# Patient Record
Sex: Male | Born: 1960 | Race: White | Hispanic: No | Smoking: Former smoker
Health system: Southern US, Community
[De-identification: ages and names within clinical notes are randomized; demographics above are authoritative.]

## PROBLEM LIST (undated history)

## (undated) DIAGNOSIS — J449 Chronic obstructive pulmonary disease, unspecified: Secondary | ICD-10-CM

## (undated) DIAGNOSIS — I255 Ischemic cardiomyopathy: Secondary | ICD-10-CM

## (undated) DIAGNOSIS — I499 Cardiac arrhythmia, unspecified: Secondary | ICD-10-CM

## (undated) DIAGNOSIS — N189 Chronic kidney disease, unspecified: Secondary | ICD-10-CM

## (undated) DIAGNOSIS — G8929 Other chronic pain: Secondary | ICD-10-CM

## (undated) DIAGNOSIS — I10 Essential (primary) hypertension: Secondary | ICD-10-CM

## (undated) DIAGNOSIS — Z95810 Presence of automatic (implantable) cardiac defibrillator: Secondary | ICD-10-CM

## (undated) DIAGNOSIS — K219 Gastro-esophageal reflux disease without esophagitis: Secondary | ICD-10-CM

## (undated) DIAGNOSIS — K7460 Unspecified cirrhosis of liver: Secondary | ICD-10-CM

## (undated) DIAGNOSIS — F32A Depression, unspecified: Secondary | ICD-10-CM

## (undated) DIAGNOSIS — B1920 Unspecified viral hepatitis C without hepatic coma: Secondary | ICD-10-CM

## (undated) DIAGNOSIS — F329 Major depressive disorder, single episode, unspecified: Secondary | ICD-10-CM

## (undated) DIAGNOSIS — M48061 Spinal stenosis, lumbar region without neurogenic claudication: Secondary | ICD-10-CM

## (undated) DIAGNOSIS — B1910 Unspecified viral hepatitis B without hepatic coma: Secondary | ICD-10-CM

## (undated) DIAGNOSIS — E785 Hyperlipidemia, unspecified: Secondary | ICD-10-CM

## (undated) DIAGNOSIS — M1990 Unspecified osteoarthritis, unspecified site: Secondary | ICD-10-CM

## (undated) DIAGNOSIS — M4800 Spinal stenosis, site unspecified: Secondary | ICD-10-CM

## (undated) DIAGNOSIS — I2510 Atherosclerotic heart disease of native coronary artery without angina pectoris: Secondary | ICD-10-CM

## (undated) DIAGNOSIS — I509 Heart failure, unspecified: Secondary | ICD-10-CM

## (undated) DIAGNOSIS — I609 Nontraumatic subarachnoid hemorrhage, unspecified: Secondary | ICD-10-CM

## (undated) HISTORY — DX: Nontraumatic subarachnoid hemorrhage, unspecified: I60.9

## (undated) HISTORY — DX: Other chronic pain: G89.29

## (undated) HISTORY — DX: Heart failure, unspecified: I50.9

## (undated) HISTORY — DX: Unspecified viral hepatitis C without hepatic coma: B19.20

## (undated) HISTORY — DX: Spinal stenosis, site unspecified: M48.00

## (undated) HISTORY — DX: Hyperlipidemia, unspecified: E78.5

## (undated) HISTORY — PX: OTHER SURGICAL HISTORY: SHX169

## (undated) HISTORY — DX: Depression, unspecified: F32.A

---
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
## 2003-12-22 ENCOUNTER — Emergency Department (HOSPITAL_COMMUNITY): Admission: EM | Admit: 2003-12-22 | Discharge: 2003-12-23 | Payer: Self-pay

## 2019-02-06 DIAGNOSIS — I219 Acute myocardial infarction, unspecified: Secondary | ICD-10-CM

## 2019-02-06 DIAGNOSIS — I472 Ventricular tachycardia, unspecified: Secondary | ICD-10-CM

## 2019-02-06 DIAGNOSIS — I214 Non-ST elevation (NSTEMI) myocardial infarction: Secondary | ICD-10-CM

## 2019-02-06 HISTORY — DX: Ventricular tachycardia: I47.2

## 2019-02-06 HISTORY — DX: Ventricular tachycardia, unspecified: I47.20

## 2019-02-06 HISTORY — DX: Acute myocardial infarction, unspecified: I21.9

## 2019-02-06 HISTORY — DX: Non-ST elevation (NSTEMI) myocardial infarction: I21.4

## 2019-02-06 HISTORY — PX: ICD IMPLANT: EP1208

## 2019-02-28 ENCOUNTER — Inpatient Hospital Stay
Admission: EM | Admit: 2019-02-28 | Discharge: 2019-03-02 | DRG: 280 | Discharge status: Against Medical Advice | Payer: Medicaid Other | Attending: Internal Medicine | Admitting: Internal Medicine

## 2019-02-28 ENCOUNTER — Emergency Department: Payer: Medicaid Other

## 2019-02-28 ENCOUNTER — Inpatient Hospital Stay: Payer: Medicaid Other

## 2019-02-28 ENCOUNTER — Other Ambulatory Visit: Payer: Self-pay

## 2019-02-28 DIAGNOSIS — K7469 Other cirrhosis of liver: Secondary | ICD-10-CM

## 2019-02-28 DIAGNOSIS — E785 Hyperlipidemia, unspecified: Secondary | ICD-10-CM

## 2019-02-28 DIAGNOSIS — B1910 Unspecified viral hepatitis B without hepatic coma: Secondary | ICD-10-CM | POA: Diagnosis present

## 2019-02-28 DIAGNOSIS — J441 Chronic obstructive pulmonary disease with (acute) exacerbation: Secondary | ICD-10-CM

## 2019-02-28 DIAGNOSIS — I214 Non-ST elevation (NSTEMI) myocardial infarction: Secondary | ICD-10-CM | POA: Diagnosis present

## 2019-02-28 DIAGNOSIS — F17200 Nicotine dependence, unspecified, uncomplicated: Secondary | ICD-10-CM | POA: Diagnosis present

## 2019-02-28 DIAGNOSIS — Z20822 Contact with and (suspected) exposure to covid-19: Secondary | ICD-10-CM | POA: Diagnosis present

## 2019-02-28 DIAGNOSIS — Z23 Encounter for immunization: Secondary | ICD-10-CM | POA: Diagnosis not present

## 2019-02-28 DIAGNOSIS — B182 Chronic viral hepatitis C: Secondary | ICD-10-CM

## 2019-02-28 DIAGNOSIS — K760 Fatty (change of) liver, not elsewhere classified: Secondary | ICD-10-CM | POA: Diagnosis present

## 2019-02-28 DIAGNOSIS — I429 Cardiomyopathy, unspecified: Secondary | ICD-10-CM | POA: Diagnosis present

## 2019-02-28 DIAGNOSIS — I110 Hypertensive heart disease with heart failure: Secondary | ICD-10-CM | POA: Diagnosis present

## 2019-02-28 DIAGNOSIS — I472 Ventricular tachycardia, unspecified: Secondary | ICD-10-CM

## 2019-02-28 DIAGNOSIS — R0602 Shortness of breath: Secondary | ICD-10-CM

## 2019-02-28 DIAGNOSIS — I5021 Acute systolic (congestive) heart failure: Secondary | ICD-10-CM

## 2019-02-28 DIAGNOSIS — R7401 Elevation of levels of liver transaminase levels: Secondary | ICD-10-CM

## 2019-02-28 DIAGNOSIS — J9601 Acute respiratory failure with hypoxia: Secondary | ICD-10-CM | POA: Diagnosis present

## 2019-02-28 DIAGNOSIS — I509 Heart failure, unspecified: Secondary | ICD-10-CM

## 2019-02-28 DIAGNOSIS — Z5329 Procedure and treatment not carried out because of patient's decision for other reasons: Secondary | ICD-10-CM | POA: Diagnosis not present

## 2019-02-28 DIAGNOSIS — Z716 Tobacco abuse counseling: Secondary | ICD-10-CM

## 2019-02-28 DIAGNOSIS — Z79899 Other long term (current) drug therapy: Secondary | ICD-10-CM

## 2019-02-28 HISTORY — DX: Essential (primary) hypertension: I10

## 2019-02-28 HISTORY — DX: Chronic obstructive pulmonary disease, unspecified: J44.9

## 2019-02-28 LAB — HEPARIN LEVEL (UNFRACTIONATED): Heparin Unfractionated: 0.38 IU/mL (ref 0.30–0.70)

## 2019-02-28 LAB — HEPATIC FUNCTION PANEL
ALT: 172 U/L — ABNORMAL HIGH (ref 0–44)
AST: 96 U/L — ABNORMAL HIGH (ref 15–41)
Albumin: 3.8 g/dL (ref 3.5–5.0)
Alkaline Phosphatase: 76 U/L (ref 38–126)
Bilirubin, Direct: 0.5 mg/dL — ABNORMAL HIGH (ref 0.0–0.2)
Indirect Bilirubin: 1.1 mg/dL — ABNORMAL HIGH (ref 0.3–0.9)
Total Bilirubin: 1.6 mg/dL — ABNORMAL HIGH (ref 0.3–1.2)
Total Protein: 7 g/dL (ref 6.5–8.1)

## 2019-02-28 LAB — HEMOGLOBIN A1C
Hgb A1c MFr Bld: 5.9 % — ABNORMAL HIGH (ref 4.8–5.6)
Mean Plasma Glucose: 122.63 mg/dL

## 2019-02-28 LAB — BASIC METABOLIC PANEL
Anion gap: 14 (ref 5–15)
BUN: 36 mg/dL — ABNORMAL HIGH (ref 6–20)
CO2: 24 mmol/L (ref 22–32)
Calcium: 8.4 mg/dL — ABNORMAL LOW (ref 8.9–10.3)
Chloride: 97 mmol/L — ABNORMAL LOW (ref 98–111)
Creatinine, Ser: 0.97 mg/dL (ref 0.61–1.24)
GFR calc Af Amer: >60 mL/min (ref >60)
GFR calc non Af Amer: >60 mL/min (ref >60)
Glucose, Bld: 176 mg/dL — ABNORMAL HIGH (ref 70–99)
Potassium: 3.6 mmol/L (ref 3.5–5.1)
Sodium: 135 mmol/L (ref 135–145)

## 2019-02-28 LAB — BLOOD GAS, ARTERIAL
Acid-base deficit: 0.8 mmol/L (ref 0.0–2.0)
Bicarbonate: 25.4 mmol/L (ref 20.0–28.0)
FIO2: 100
O2 Saturation: 52.7 %
Patient temperature: 37
pCO2 arterial: 47 mmHg (ref 32.0–48.0)
pH, Arterial: 7.34 — ABNORMAL LOW (ref 7.350–7.450)
pO2, Arterial: <31 mmHg — CL (ref 83.0–108.0)

## 2019-02-28 LAB — RESPIRATORY PANEL BY RT PCR (FLU A&B, COVID)
Influenza A by PCR: NEGATIVE
Influenza B by PCR: NEGATIVE
SARS Coronavirus 2 by RT PCR: NEGATIVE

## 2019-02-28 LAB — CBC
HCT: 42 % (ref 39.0–52.0)
Hemoglobin: 13.9 g/dL (ref 13.0–17.0)
MCH: 28.4 pg (ref 26.0–34.0)
MCHC: 33.1 g/dL (ref 30.0–36.0)
MCV: 85.9 fL (ref 80.0–100.0)
Platelets: 260 10*3/uL (ref 150–400)
RBC: 4.89 MIL/uL (ref 4.22–5.81)
RDW: 13.8 % (ref 11.5–15.5)
WBC: 14.4 10*3/uL — ABNORMAL HIGH (ref 4.0–10.5)
nRBC: 0 % (ref 0.0–0.2)

## 2019-02-28 LAB — GLUCOSE, CAPILLARY: Glucose-Capillary: 154 mg/dL — ABNORMAL HIGH (ref 70–99)

## 2019-02-28 LAB — TROPONIN I (HIGH SENSITIVITY)
Troponin I (High Sensitivity): 63 ng/L — ABNORMAL HIGH (ref <18)
Troponin I (High Sensitivity): 62 ng/L — ABNORMAL HIGH (ref <18)

## 2019-02-28 LAB — PROTIME-INR
INR: 1.2 (ref 0.8–1.2)
Prothrombin Time: 15.1 seconds (ref 11.4–15.2)

## 2019-02-28 LAB — APTT: aPTT: 29 seconds (ref 24–36)

## 2019-02-28 LAB — TSH: TSH: 2.095 u[IU]/mL (ref 0.350–4.500)

## 2019-02-28 LAB — MAGNESIUM: Magnesium: 2 mg/dL (ref 1.7–2.4)

## 2019-02-28 LAB — MRSA PCR SCREENING: MRSA by PCR: NEGATIVE

## 2019-02-28 LAB — PROCALCITONIN: Procalcitonin: <0.1 ng/mL

## 2019-02-28 LAB — POC SARS CORONAVIRUS 2 AG: SARS Coronavirus 2 Ag: NEGATIVE

## 2019-02-28 LAB — BRAIN NATRIURETIC PEPTIDE: B Natriuretic Peptide: 1216 pg/mL — ABNORMAL HIGH (ref 0.0–100.0)

## 2019-02-28 MED ORDER — HEPARIN (PORCINE) 25000 UT/250ML-% IV SOLN
1900.0000 [IU]/h | INTRAVENOUS | Status: DC
  Administered 2019-02-28: 1100 [IU]/h via INTRAVENOUS
  Administered 2019-03-01: 1300 [IU]/h via INTRAVENOUS
  Filled 2019-02-28 (×3): qty 250

## 2019-02-28 MED ORDER — LIDOCAINE HCL (CARDIAC) PF 100 MG/5ML IV SOSY
1.0000 mg/kg | PREFILLED_SYRINGE | Freq: Once | INTRAVENOUS | Status: AC
  Administered 2019-02-28: 81.6 mg via INTRAVENOUS

## 2019-02-28 MED ORDER — AMIODARONE HCL IN DEXTROSE 360-4.14 MG/200ML-% IV SOLN
30.0000 mg/h | INTRAVENOUS | Status: DC
  Administered 2019-02-28 – 2019-03-02 (×4): 30 mg/h via INTRAVENOUS
  Filled 2019-02-28 (×4): qty 200

## 2019-02-28 MED ORDER — THIAMINE HCL 100 MG/ML IJ SOLN
100.0000 mg | Freq: Once | INTRAMUSCULAR | Status: AC
  Administered 2019-02-28: 100 mg via INTRAVENOUS
  Filled 2019-02-28: qty 2

## 2019-02-28 MED ORDER — ASPIRIN EC 81 MG PO TBEC
81.0000 mg | DELAYED_RELEASE_TABLET | Freq: Every day | ORAL | Status: DC
  Administered 2019-03-01 – 2019-03-02 (×2): 81 mg via ORAL
  Filled 2019-02-28 (×2): qty 1

## 2019-02-28 MED ORDER — FENTANYL CITRATE (PF) 100 MCG/2ML IJ SOLN
INTRAMUSCULAR | Status: AC
  Filled 2019-02-28: qty 2

## 2019-02-28 MED ORDER — CHLORHEXIDINE GLUCONATE CLOTH 2 % EX PADS
6.0000 | MEDICATED_PAD | Freq: Every day | CUTANEOUS | Status: DC
  Administered 2019-03-01 – 2019-03-02 (×2): 6 via TOPICAL

## 2019-02-28 MED ORDER — AMIODARONE LOAD VIA INFUSION
150.0000 mg | Freq: Once | INTRAVENOUS | Status: AC
  Administered 2019-02-28: 150 mg via INTRAVENOUS

## 2019-02-28 MED ORDER — ATORVASTATIN CALCIUM 20 MG PO TABS
80.0000 mg | ORAL_TABLET | Freq: Every day | ORAL | Status: DC
  Administered 2019-02-28 – 2019-03-01 (×2): 80 mg via ORAL
  Filled 2019-02-28 (×2): qty 4

## 2019-02-28 MED ORDER — AMIODARONE HCL IN DEXTROSE 360-4.14 MG/200ML-% IV SOLN
60.0000 mg/h | INTRAVENOUS | Status: AC
  Administered 2019-02-28: 60 mg/h via INTRAVENOUS
  Filled 2019-02-28: qty 200

## 2019-02-28 MED ORDER — SODIUM CHLORIDE 0.9% FLUSH
3.0000 mL | Freq: Once | INTRAVENOUS | Status: AC
  Administered 2019-02-28: 3 mL via INTRAVENOUS

## 2019-02-28 MED ORDER — PNEUMOCOCCAL VAC POLYVALENT 25 MCG/0.5ML IJ INJ
0.5000 mL | INJECTION | INTRAMUSCULAR | Status: AC
  Administered 2019-03-01: 0.5 mL via INTRAMUSCULAR
  Filled 2019-02-28: qty 0.5

## 2019-02-28 MED ORDER — SODIUM CHLORIDE 0.9 % IV SOLN: Freq: Once | INTRAVENOUS | Status: AC

## 2019-02-28 MED ORDER — ASPIRIN 81 MG PO CHEW
324.0000 mg | CHEWABLE_TABLET | Freq: Once | ORAL | Status: AC
  Administered 2019-02-28: 11:00:00 324 mg via ORAL
  Filled 2019-02-28: qty 4

## 2019-02-28 MED ORDER — ONDANSETRON HCL 4 MG/2ML IJ SOLN: 4.0000 mg | Freq: Four times a day (QID) | INTRAMUSCULAR | Status: DC | PRN

## 2019-02-28 MED ORDER — NITROGLYCERIN 0.4 MG SL SUBL: 0.4000 mg | SUBLINGUAL_TABLET | SUBLINGUAL | Status: DC | PRN

## 2019-02-28 MED ORDER — HYDROCODONE-ACETAMINOPHEN 10-325 MG PO TABS
1.0000 | ORAL_TABLET | Freq: Three times a day (TID) | ORAL | Status: DC | PRN
  Administered 2019-02-28 – 2019-03-02 (×7): 1 via ORAL
  Filled 2019-02-28 (×7): qty 1

## 2019-02-28 MED ORDER — ACETAMINOPHEN 325 MG PO TABS
650.0000 mg | ORAL_TABLET | ORAL | Status: DC | PRN
  Administered 2019-02-28: 650 mg via ORAL
  Filled 2019-02-28: qty 2

## 2019-02-28 MED ORDER — HEPARIN BOLUS VIA INFUSION
4000.0000 [IU] | Freq: Once | INTRAVENOUS | Status: AC
  Administered 2019-02-28: 4000 [IU] via INTRAVENOUS
  Filled 2019-02-28: qty 4000

## 2019-02-28 MED ORDER — MIDAZOLAM HCL 2 MG/2ML IJ SOLN
INTRAMUSCULAR | Status: AC
  Filled 2019-02-28: qty 6

## 2019-02-28 MED ORDER — FENTANYL CITRATE (PF) 100 MCG/2ML IJ SOLN
50.0000 ug | Freq: Once | INTRAMUSCULAR | Status: AC
  Administered 2019-02-28: 11:00:00 50 ug via INTRAVENOUS

## 2019-02-28 MED ORDER — IOHEXOL 350 MG/ML SOLN
75.0000 mL | Freq: Once | INTRAVENOUS | Status: AC | PRN
  Administered 2019-02-28: 75 mL via INTRAVENOUS

## 2019-02-28 MED ORDER — MIDAZOLAM HCL 2 MG/2ML IJ SOLN
2.0000 mg | Freq: Once | INTRAMUSCULAR | Status: AC
  Administered 2019-02-28: 2 mg via INTRAVENOUS

## 2019-02-28 NOTE — ED Notes (Signed)
Pt c/o 10/10 CP and SHOB - pt increased to 6L via n/c

## 2019-02-28 NOTE — ED Notes (Signed)
Report given to Amy, RN.

## 2019-02-28 NOTE — ED Notes (Signed)
Cardiologist to bedside.  

## 2019-02-28 NOTE — ED Notes (Signed)
Right 18 gauge in AC flushing and drawing blood- site cleaned and IV dressing changed. Reinforced 22 gauge IV with tape- site is clean, dray, and flushes and draws blood.

## 2019-02-28 NOTE — H&P (Signed)
History and Physical    Brent [name] ZOX:[id number] DOB: August 25, 1960 DOA: 02/28/2019   PCP: Patient, No Pcp Per   Patient coming from: home  Chief Complaint: Shortness of breath.  HPI: Brent [name] is a 59 y.o. male with no significant past medical history seen in ed for worsening shortness of breath since Monday . Yesterday he got worse and could not breath at all and called EMS.   ED Course:  On EMS arrival he was found to be in V.tach and given amiodarone 150 mg and started on drip but had to be electrically cardioverted with verbal consent and sedation per edmd note and post cardioversion he was stable still hypoxic but stable.pt is planned to have cardiac cath on Monday.    Review of Systems: As per HPI otherwise 10 point review of systems negative.    Past Medical History:  Diagnosis Date  . COPD (chronic obstructive pulmonary disease) (HCC)   . Hypertension     History reviewed. No pertinent surgical history.   reports previous alcohol use. He reports previous drug use. No history on file for tobacco.  No Known Allergies  No family history on file. MOM: unknown. DAD: brain tumour.    Prior to Admission medications   Medication Sig Start Date End Date Taking? Authorizing Provider  cyclobenzaprine (FLEXERIL) 10 MG tablet Take 10 mg by mouth 3 (three) times daily as needed for muscle spasms.   Yes [provider]  gabapentin (NEURONTIN) 300 MG capsule Take 300 mg by mouth 3 (three) times daily as needed (pain).    Yes [provider]  Multiple Vitamin (MULTIVITAMIN WITH MINERALS) TABS tablet Take 1 tablet by mouth daily.   Yes [provider]  naproxen sodium (ALEVE) 220 MG tablet Take 220-440 mg by mouth 2 (two) times daily as needed (pain).   Yes [provider]  oxyCODONE (ROXICODONE) 15 MG immediate release tablet Take 15 mg by mouth See admin instructions. Take 1 tablet (15mg ) by mouth three to four times daily 02/11/19  Yes [provider]    Physical Exam: Vitals:   02/28/19 1430 02/28/19 1445 02/28/19 1500 02/28/19 1515  BP: (!) 114/94 (!) 120/102 (!) 122/94 (!) 123/99  Pulse:      Resp:      Temp:      TempSrc:      SpO2:      Weight:      Height:       Constitutional: NAD, calm, comfortable Vitals:   02/28/19 1430 02/28/19 1445 02/28/19 1500 02/28/19 1515  BP: (!) 114/94 (!) 120/102 (!) 122/94 (!) 123/99  Pulse:      Resp:      Temp:      TempSrc:      SpO2:      Weight:      Height:       Eyes: PERRL, lids and conjunctivae normal ENMT: Mucous membranes are moist. Posterior pharynx clear of any exudate or lesions.Normal dentition.  Neck: normal, supple, no masses, no thyromegaly Respiratory: BL basilar crackles .No accessory muscle use.  Cardiovascular: Regular rate and rhythm, no murmurs / rubs / gallops. No extremity edema. 2+ pedal pulses. No carotid bruits.  Abdomen: no tenderness, no masses palpated. No hepatosplenomegaly. Bowel sounds positive.  Musculoskeletal: no clubbing / cyanosis. No joint deformity upper and lower extremities. Good ROM, no contractures. Normal muscle tone.  Skin: no rashes, lesions, ulcers. No induration Neurologic: CN 2-12 grossly intact. Sensation intact, DTR normal. Strength  5/5 in all 4.  Psychiatric: Normal judgment and insight. Alert and oriented x 3. Normal mood.   Labs on Admission: I have personally reviewed following labs and imaging studies  CBC: Recent Labs  Lab 02/28/19 1026  WBC 14.4*  HGB 13.9  HCT 42.0  MCV 85.9  PLT 260   Basic Metabolic Panel: Recent Labs  Lab 02/28/19 1026  NA 135  K 3.6  CL 97*  CO2 24  GLUCOSE 176*  BUN 36*  CREATININE 0.97  CALCIUM 8.4*  MG 2.0   GFR: Estimated Creatinine Clearance: 81.3 mL/min (by C-G formula based on SCr of 0.97 mg/dL). Liver Function Tests: Recent Labs  Lab 02/28/19 1026  AST 96*  ALT 172*  ALKPHOS 76  BILITOT 1.6*  PROT 7.0  ALBUMIN 3.8   No results for input(s):  LIPASE, AMYLASE in the last 168 hours. No results for input(s): AMMONIA in the last 168 hours. Coagulation Profile: Recent Labs  Lab 02/28/19 1026  INR 1.2   Cardiac Enzymes: No results for input(s): CKTOTAL, CKMB, CKMBINDEX, TROPONINI in the last 168 hours. BNP (last 3 results) No results for input(s): PROBNP in the last 8760 hours. HbA1C: No results for input(s): HGBA1C in the last 72 hours. CBG: Recent Labs  Lab 02/28/19 1041  GLUCAP 154*   Lipid Profile: No results for input(s): CHOL, HDL, LDLCALC, TRIG, CHOLHDL, LDLDIRECT in the last 72 hours. Thyroid Function Tests: No results for input(s): TSH, T4TOTAL, FREET4, T3FREE, THYROIDAB in the last 72 hours. Anemia Panel: No results for input(s): VITAMINB12, FOLATE, FERRITIN, TIBC, IRON, RETICCTPCT in the last 72 hours. Urine analysis: No results found for: COLORURINE, APPEARANCEUR, LABSPEC, PHURINE, GLUCOSEU, HGBUR, BILIRUBINUR, KETONESUR, PROTEINUR, UROBILINOGEN, NITRITE, LEUKOCYTESUR  ) Recent Results (from the past 240 hour(s))  Respiratory Panel by RT PCR (Flu A&B, Covid) - Nasopharyngeal Swab     Status: None   Collection Time: 02/28/19 12:53 PM   Specimen: Nasopharyngeal Swab  Result Value Ref Range Status   SARS Coronavirus 2 by RT PCR NEGATIVE NEGATIVE Final    Comment: (NOTE) SARS-CoV-2 target nucleic acids are NOT DETECTED. The SARS-CoV-2 RNA is generally detectable in upper respiratoy specimens during the acute phase of infection. The lowest concentration of SARS-CoV-2 viral copies this assay can detect is 131 copies/mL. A negative result does not preclude SARS-Cov-2 infection and should not be used as the sole basis for treatment or other patient management decisions. A negative result may occur with  improper specimen collection/handling, submission of specimen other than nasopharyngeal swab, presence of viral mutation(s) within the areas targeted by this assay, and inadequate number of viral copies (<131  copies/mL). A negative result must be combined with clinical observations, patient history, and epidemiological information. The expected result is Negative. Fact Sheet for Patients:  https://www.moore.com/ Fact Sheet for Healthcare Providers:  https://www.young.biz/ This test is not yet ap proved or cleared by the [place] FDA and  has been authorized for detection and/or diagnosis of SARS-CoV-2 by FDA under an Emergency Use Authorization (EUA). This EUA will remain  in effect (meaning this test can be used) for the duration of the COVID-19 declaration under Section 564(b)(1) of the Act, 21 U.S.C. section 360bbb-3(b)(1), unless the authorization is terminated or revoked sooner.    Influenza A by PCR NEGATIVE NEGATIVE Final   Influenza B by PCR NEGATIVE NEGATIVE Final    Comment: (NOTE) The Xpert Xpress SARS-CoV-2/FLU/RSV assay is intended as an aid in  the diagnosis of influenza from Nasopharyngeal swab specimens  and  should not be used as a sole basis for treatment. Nasal washings and  aspirates are unacceptable for Xpert Xpress SARS-CoV-2/FLU/RSV  testing. Fact Sheet for Patients: PinkCheek.be Fact Sheet for Healthcare Providers: GravelBags.it This test is not yet approved or cleared by the [place] FDA and  has been authorized for detection and/or diagnosis of SARS-CoV-2 by  FDA under an Emergency Use Authorization (EUA). This EUA will remain  in effect (meaning this test can be used) for the duration of the  Covid-19 declaration under Section 564(b)(1) of the Act, 21  U.S.C. section 360bbb-3(b)(1), unless the authorization is  terminated or revoked. Performed at [facility], [address] [place], [place] [address]      Radiological Exams on Admission: DG Chest Portable 1 View  Result Date: 02/28/2019 CLINICAL DATA:  59 year old male with a history of  chest pain and shortness of breath with V-tach EXAM: PORTABLE CHEST 1 VIEW COMPARISON:  None. FINDINGS: Cardiomediastinal silhouette borderline enlarged. Low lung volumes. Reticular opacities throughout the lungs. No confluent airspace disease. No pneumothorax. No large pleural effusion. Defibrillator pads on the low chest. Degenerative changes of the spine and bilateral shoulders IMPRESSION: Reticular opacity of the lungs may reflect early pulmonary edema and/or atypical infection. Defibrillator pads on the low chest wall. Electronically Signed   By: Corrie Mckusick D.O.   On: 02/28/2019 11:02    EKG: V.tavh initially and post cardioversion q waves in inf leads.   Assessment/Plan Active Problems:   Ventricular tachycardia, sustained (HCC)   Tobacco abuse counseling   SOB (shortness of breath)   NSTEMI (non-ST elevated myocardial infarction) (HCC)   Transaminitis    Pt is a 59 y/o WM with no diagnosed Past medical history , smoker since young age present with sob and vtach on ekg that underwent emergent electrical cardioversion after no response with amiodarone, admitted for cardiac cath on Monday , to stepdown. Looking at labs suspect he has underlying prediabetes / glucose intolerance or dm II/ dyslipidemia/ fatty liver and he has not h/o alcohol.  We will admit to stepdown with heparin drip continued after d/w cardiology and statin therapy.  As BP is soft we will hold on beta blockers due to hypoxia and elevated bnp suspect pt  May also have chf because if IHD.   - cardiac enyzmes. - asa/ statin/ diuretics as bp stabilized. - electrolytes tft.  - cardiac cath on Monday per cardiology. - 2 d echo . - counseled on tobacco cessation. - transaminitis due to fatty liver possible will do a/c hepatitis panel.  - CTA chest for any pulmonary etiology for hypoxia.    DVT prophylaxis: heparin drip  Code Status: full code  Family Communication: Joycelyn Schmid [phone]. Disposition Plan: Home    Consults called: cardiology consult Dr.Calwood.   Admission status: Inpatient   Para Skeans MD [facility] If 7PM-7AM, please contact night-coverage [email or web address] Password [facility]  02/28/2019, 3:32 PM

## 2019-02-28 NOTE — ED Provider Notes (Signed)
[facility] Emergency Department Provider Note  ____________________________________________   First MD Initiated Contact with Patient 02/28/19 1052     (approximate)  I have reviewed the triage vital signs and the nursing notes.  History  Chief Complaint  v tach     HPI Brent [name] is a 59 y.o. male with hx of COPD, HTN presents via EMS, complaining of progressive shortness of breath since last Monday.  Worsening today, prompting him to call EMS.  Also complaining of some vague central chest discomfort, difficult to describe.  No radiation.  No alleviating/aggravating factors.  On EMS arrival, he was found to be in VT w/ pulse, given 150 mg amiodarone without significant improvement. Arrives on 6 L [place] for SOB, mild hypoxia.   History limited by patient's clinical status.   Past Medical Hx Past Medical History:  Diagnosis Date  . COPD (chronic obstructive pulmonary disease) ([place])   . Hypertension     Problem List There are no problems to display for this patient.   Past Surgical Hx History reviewed. No pertinent surgical history.  Medications Prior to Admission medications   Not on File    Allergies Patient has no known allergies.  Family Hx No family history on file.  Social Hx Social History   Tobacco Use  . Smoking status: Unknown If Ever Smoked  Substance Use Topics  . Alcohol use: Not Currently  . Drug use: Not Currently     Review of Systems  Constitutional: Negative for fever, chills. Eyes: Negative for visual changes. ENT: Negative for sore throat. Cardiovascular: + for chest pain. Respiratory: + for shortness of breath. Gastrointestinal: Negative for nausea, vomiting.  Genitourinary: Negative for dysuria. Musculoskeletal: Negative for leg swelling. Skin: Negative for rash. Neurological: Negative for for headaches.   Physical Exam  Vital Signs: ED Triage Vitals  Enc Vitals Group     BP 02/28/19 1025 113/83       Pulse Rate 02/28/19 1025 (!) 214     Resp 02/28/19 1025 12     Temp --      Temp src --      SpO2 02/28/19 1020 (!) 87 %     Weight 02/28/19 1022 180 lb (81.6 kg)     Height 02/28/19 1022 5\' 8"  (1.727 m)     Head Circumference --      Peak Flow --      Pain Score 02/28/19 1021 10     Pain Loc --      Pain Edu? --      Excl. in [place]? --     Constitutional: Alert and oriented.  Pale, diaphoretic. Head: Normocephalic. Atraumatic. Eyes: Conjunctivae clear. Sclera anicteric. Nose: No congestion. No rhinorrhea. Mouth/Throat: Wearing mask.  Neck: No stridor.   Cardiovascular: Tachycardic.  Wide-complex rhythm on monitor.  Extremities cool. Respiratory: Normal respiratory effort.  Lungs CTA anteriorly.  Arrives on [place]. Gastrointestinal: Soft. Non-tender. Non-distended.  Musculoskeletal: No lower extremity edema. No deformities. Neurologic:  Normal speech and language. No gross focal neurologic deficits are appreciated.  Skin: Cool and diaphoretic. Psychiatric: Mood and affect are appropriate for situation.  EKG  Personally reviewed.   EKG at 10:19 AM Rate: 200s Rhythm: regular, wide complex tachycardia Axis: N/A Intervals: wide complex tachycardia Ventricular tachycardia  EKG s/p electrical cardioversion, 10:37 AM Rate: 91 Rhythm: sinus Axis: normal Intervals: within normal limits Q waves inferiorly with TWI inferior and lateral precordial leads Discussed case with cardiology, does not meet acute STEMI/emergent  cath lab criteria    Radiology  CXR: IMPRESSION:  Reticular opacity of the lungs may reflect early pulmonary edema  and/or atypical infection.   Defibrillator pads on the low chest wall.    Procedures  Procedure(s) performed (including critical care):  .Critical Care Performed by: Miguel Aschoff., MD Authorized by: Miguel Aschoff., MD   Critical care provider statement:    Critical care time (minutes):  45   Critical care was necessary to treat or  prevent imminent or life-threatening deterioration of the following conditions:  Cardiac failure   Critical care was time spent personally by me on the following activities:  Discussions with consultants, evaluation of patient's response to treatment, examination of patient, ordering and performing treatments and interventions, ordering and review of laboratory studies, ordering and review of radiographic studies, pulse oximetry, re-evaluation of patient's condition, obtaining history from patient or surrogate and review of old charts .Cardioversion  Date/Time: 02/28/2019 11:01 AM Performed by: Miguel Aschoff., MD Authorized by: Miguel Aschoff., MD   Consent:    Consent obtained:  Verbal   Consent given by:  Patient Pre-procedure details:    Cardioversion basis:  Emergent   Rhythm:  Ventricular tachycardia   Electrode placement:  Anterior-lateral Patient sedated: Yes. Refer to sedation procedure documentation for details of sedation.  Attempt one:    Cardioversion mode:  Synchronous   Waveform:  Biphasic   Shock (Joules):  120   Shock outcome:  Conversion to normal sinus rhythm Post-procedure details:    Patient status:  Alert   Patient tolerance of procedure:  Tolerated well, no immediate complications .Sedation  Date/Time: 02/28/2019 11:01 AM Performed by: Miguel Aschoff., MD Authorized by: Miguel Aschoff., MD   Consent:    Consent obtained:  Verbal   Consent given by:  Patient Universal protocol:    Procedure explained and questions answered to patient or proxy's satisfaction: yes     Required blood products, implants, devices, and special equipment available: yes     Site/side marked: yes     Immediately prior to procedure a time out was called: yes   Indications:    Procedure performed:  Cardioversion Pre-sedation assessment:    Time since last food or drink:  Unable to specify   NPO status caution: unable to specify NPO status and urgency dictates proceeding with  non-ideal NPO status     ASA classification: class 2 - patient with mild systemic disease     Mallampati score:  II - soft palate, uvula, fauces visible   Pre-sedation assessments completed and reviewed: airway patency, cardiovascular function, mental status, nausea/vomiting and respiratory function     Pre-sedation assessment completed:  02/28/2019 10:30 AM Immediate pre-procedure details:    Reviewed: vital signs     Verified: bag valve mask available, emergency equipment available, intubation equipment available, IV patency confirmed, oxygen available and suction available   Procedure details (see MAR for exact dosages):    Preoxygenation:  Nasal cannula   Sedation:  Midazolam   Intended level of sedation: moderate (conscious sedation)   Analgesia:  Fentanyl   Intra-procedure monitoring:  Blood pressure monitoring and cardiac monitor   Total Provider sedation time (minutes):  10 Post-procedure details:    Post-sedation assessment completed:  02/28/2019 10:40 AM   Attendance: Constant attendance by certified staff until patient recovered     Recovery: Patient returned to pre-procedure baseline     Patient tolerance:  Tolerated well, no immediate complications  Initial Impression / Assessment and Plan / ED Course  59 y.o. male who presents to the ED for SOB, chest discomfort, found to be in wide complex tachycardia, with pulse.   Received 150 mg amiodarone with EMS PTA. Receive another 150 mg in ED. Remained in VT. Given ~1 mg/kg lidocaine with no response. Became more hypotensive/diaphoretic, opted for electrical cardioversion w/ sedation, patient gave emergent verbal consent. Successfully cardioverted to NSR. See above.  Dicussed post cardioversion EKG w/ cardiology as above, not candidate for emergent cath lab. Though concern for possible ischemic event that may have precipitated his symptoms onset about a week ago, perhaps leading to ischemia > VT and possible heart failure.  Cardiology advised continued amiodarone infusion, heparin gtt, urgent echocardiogram, admission.  Discussed with hospitalist for admission.   Final Clinical Impression(s) / ED Diagnosis  Final diagnoses:  Ventricular tachycardia [facility])       Note:  This document was prepared using Dragon voice recognition software and may include unintentional dictation errors.   Miguel Aschoff., MD 02/28/19 [phone]

## 2019-02-28 NOTE — ED Notes (Signed)
Pt cardioverted with 120 jewels and HR decreased to 95

## 2019-02-28 NOTE — Consult Note (Signed)
ANTICOAGULATION CONSULT NOTE - Initial Consult  Pharmacy Consult for Heparin Infusion  Indication: chest pain/ACS  No Known Allergies  Patient Measurements: Height: 6' (182.9 cm) Weight: 211 lb 10.3 oz (96 kg) IBW/kg (Calculated) : 77.6  Vital Signs: Temp: 97.5 F (36.4 C) (01/23 1547) Temp Source: Axillary (01/23 1547) BP: 130/99 (01/23 1700) Pulse Rate: 42 (01/23 1700)  Labs: Recent Labs    02/28/19 1026 02/28/19 1241 02/28/19 1817  HGB 13.9  --   --   HCT 42.0  --   --   PLT 260  --   --   APTT 29  --   --   LABPROT 15.1  --   --   INR 1.2  --   --   HEPARINUNFRC  --   --  0.38  CREATININE 0.97  --   --   TROPONINIHS 63* 62*  --     Estimated Creatinine Clearance: 101 mL/min (by C-G formula based on SCr of 0.97 mg/dL).   Medical History: Past Medical History:  Diagnosis Date  . COPD (chronic obstructive pulmonary disease) (HCC)   . Hypertension    Assessment: Pharmacy consulted for heparin infusion dosing and monitoring for ACS/STEMI. On EMS arrival patient was found to in V tach. Amiodarone started.    Goal of Therapy:  Heparin level 0.3-0.7 units/ml Monitor platelets by anticoagulation protocol: Yes   Plan:  Baseline labs ordered Give 4000 units bolus x 1 Start heparin infusion at 1100 units/hr Check anti-Xa level in 6 hours and daily while on heparin Continue to monitor H&H and platelets  1/23:  HL @ 1817 = 0.38 Will continue pt on current rate and recheck HL in 6 hrs on 1/24 @ 0000.   Scherrie Gerlach, PharmD Clinical Pharmacist 02/28/2019 7:15 PM

## 2019-02-28 NOTE — Consult Note (Signed)
CARDIOLOGY CONSULT NOTE               Patient ID: Brent [name] MRN: [id number] DOB/AGE: 07/31/1960 59 y.o.  Admit date: 02/28/2019 Referring Physician Leandrew Koyanagi MD ER Primary Physician none Primary Cardiologist none Reason for Consultation wide-complex tachycardia possibly V. tach shortness of breath  HPI: Patient presents with a history of several days of dyspnea shortness of breath history of hypertension complaint of progressive dyspnea he states he got worse day of admission which prompted EMS call he also complained of chest discomfort and tightness was picked up by EMS and was found to have ventricular tachycardia on EKG and was treated with amiodarone therapy came to the emergency room still was going at a rate of about 200 with wide-complex and subsequently was cardioverted in the emergency room back to normal sinus rhythm.  Denies previous cardiac history history of COPD generalized weakness.  Review of systems complete and found to be negative unless listed above     Past Medical History:  Diagnosis Date  . COPD (chronic obstructive pulmonary disease) (HCC)   . Hypertension     History reviewed. No pertinent surgical history.  Medications Prior to Admission  Medication Sig Dispense Refill Last Dose  . cyclobenzaprine (FLEXERIL) 10 MG tablet Take 10 mg by mouth 3 (three) times daily as needed for muscle spasms.   Unknown at PRN  . gabapentin (NEURONTIN) 300 MG capsule Take 300 mg by mouth 3 (three) times daily as needed (pain).    Unknown at PRN  . Multiple Vitamin (MULTIVITAMIN WITH MINERALS) TABS tablet Take 1 tablet by mouth daily.     . naproxen sodium (ALEVE) 220 MG tablet Take 220-440 mg by mouth 2 (two) times daily as needed (pain).   Unknown at PRN  . oxyCODONE (ROXICODONE) 15 MG immediate release tablet Take 15 mg by mouth See admin instructions. Take 1 tablet (15mg ) by mouth three to four times daily   24+ hours at Unknown   Social History   Socioeconomic  History  . Marital status: Married    Spouse name: Not on file  . Number of children: Not on file  . Years of education: Not on file  . Highest education level: Not on file  Occupational History  . Not on file  Tobacco Use  . Smoking status: Current Every Day Smoker  Substance and Sexual Activity  . Alcohol use: Not Currently  . Drug use: Not Currently  . Sexual activity: Not on file  Other Topics Concern  . Not on file  Social History Narrative  . Not on file   Social Determinants of Health   Financial Resource Strain:   . Difficulty of Paying Living Expenses: Not on file  Food Insecurity:   . Worried About in the Last Year: Not on file  . Ran Out of Food in the Last Year: Not on file  Transportation Needs:   . Lack of Transportation (Medical): Not on file  . Lack of Transportation (Non-Medical): Not on file  Physical Activity:   . Days of Exercise per Week: Not on file  . Minutes of Exercise per Session: Not on file  Stress:   . Feeling of Stress : Not on file  Social Connections:   . Frequency of Communication with Friends and Family: Not on file  . Frequency of Social Gatherings with Friends and Family: Not on file  . Attends Religious Services: Not on file  . Active Member  of Clubs or Organizations: Not on file  . Attends Banker Meetings: Not on file  . Marital Status: Not on file  Intimate Partner Violence:   . Fear of Current or Ex-Partner: Not on file  . Emotionally Abused: Not on file  . Physically Abused: Not on file  . Sexually Abused: Not on file    History reviewed. No pertinent family history.    Review of systems complete and found to be negative unless listed above      PHYSICAL EXAM  General: Well developed, well nourished, in no acute distress HEENT:  Normocephalic and atramatic Neck:  No JVD.  Lungs: Clear bilaterally to auscultation and percussion. Heart: HRRR . Normal S1 and S2 without gallops or  murmurs.  Abdomen: Bowel sounds are positive, abdomen soft and non-tender  Msk:  Back normal, normal gait. Normal strength and tone for age. Extremities: No clubbing, cyanosis or edema.   Neuro: Alert and oriented X 3. Psych:  Good affect, responds appropriately  Labs:   Lab Results  Component Value Date   WBC 14.4 (H) 02/28/2019   HGB 13.9 02/28/2019   HCT 42.0 02/28/2019   MCV 85.9 02/28/2019   PLT 260 02/28/2019    Recent Labs  Lab 02/28/19 1026  NA 135  K 3.6  CL 97*  CO2 24  BUN 36*  CREATININE 0.97  CALCIUM 8.4*  PROT 7.0  BILITOT 1.6*  ALKPHOS 76  ALT 172*  AST 96*  GLUCOSE 176*   No results found for: CKTOTAL, CKMB, CKMBINDEX, TROPONINI No results found for: CHOL No results found for: HDL No results found for: LDLCALC No results found for: TRIG No results found for: CHOLHDL No results found for: LDLDIRECT    Radiology: CT ANGIO CHEST PE W OR WO CONTRAST  Result Date: 02/28/2019 CLINICAL DATA:  Respiratory failure EXAM: CT ANGIOGRAPHY CHEST WITH CONTRAST TECHNIQUE: Multidetector CT imaging of the chest was performed using the standard protocol during bolus administration of intravenous contrast. Multiplanar CT image reconstructions and MIPs were obtained to evaluate the vascular anatomy. CONTRAST:  70mL OMNIPAQUE IOHEXOL 350 MG/ML SOLN COMPARISON:  None. FINDINGS: Cardiovascular: Contrast injection is sufficient to demonstrate satisfactory opacification of the pulmonary arteries to the segmental level. There is no pulmonary embolus. The main pulmonary artery is within normal limits for size. There is no CT evidence of acute right heart strain. Atherosclerotic changes are noted of the thoracic aorta. Heart size is enlarged. Coronary artery calcifications are noted. There is no significant pericardial effusion. There is reflux of contrast into the IVC consistent with some degree of cardiac dysfunction. Mediastinum/Nodes: --enlarged mediastinal and hilar lymph nodes are  noted. --No axillary lymphadenopathy. --No supraclavicular lymphadenopathy. --Normal thyroid gland. --The esophagus is unremarkable Lungs/Pleura: Emphysematous changes are noted bilaterally. There is interlobular septal thickening and scattered primarily perihilar ground-glass airspace opacities. Small bilateral pleural effusions are noted, right greater than left. The trachea is unremarkable. Upper Abdomen: There is a questionable nodular surface of the liver. There is a small amount of ascites in the upper abdomen. Musculoskeletal: No chest wall abnormality. No acute or significant osseous findings. Review of the MIP images confirms the above findings. IMPRESSION: 1. No acute pulmonary embolism. 2. Cardiomegaly with small bilateral pleural effusions and findings suggestive of pulmonary edema. A superimposed atypical infectious process is difficult to exclude. There is reflux of contrast in the IVC consistent with underlying cardiac dysfunction. 3. Probable reactive mediastinal and hilar adenopathy. 4. Small volume ascites in the upper  abdomen. 5. Questionable nodular surface of the liver which may indicate underlying cirrhosis. Aortic Atherosclerosis (ICD10-I70.0) and Emphysema (ICD10-J43.9). Electronically Signed   By: Constance Holster M.D.   On: 02/28/2019 17:30   DG Chest Portable 1 View  Result Date: 02/28/2019 CLINICAL DATA:  59 year old male with a history of chest pain and shortness of breath with V-tach EXAM: PORTABLE CHEST 1 VIEW COMPARISON:  None. FINDINGS: Cardiomediastinal silhouette borderline enlarged. Low lung volumes. Reticular opacities throughout the lungs. No confluent airspace disease. No pneumothorax. No large pleural effusion. Defibrillator pads on the low chest. Degenerative changes of the spine and bilateral shoulders IMPRESSION: Reticular opacity of the lungs may reflect early pulmonary edema and/or atypical infection. Defibrillator pads on the low chest wall. Electronically Signed    By: Corrie Mckusick D.O.   On: 02/28/2019 11:02    EKG: Presenting EKG had wide-complex tachycardia probably V. tach at rate of 200   ASSESSMENT AND PLAN:  Ventricular tachycardia Shortness of breath COPD Hypertension  Tachycardia Chest pain . Plan Agree with status post cardioversion Recommend IV heparin therapy Follow-up EKGs and troponins Echocardiogram for assessment of left ventricular function and valvular structures Recommend statin therapy for hyperlipidemia Continue inhalers for COPD as well Advised patient refrain from tobacco abuse Continue amiodarone IV load and drip for ventricular tachycardia Consider low-dose beta-blockade therapy Consider ischemic evaluation functional study versus cardiac cath  Signed: Yolonda Kida MD 02/28/2019, 10:27 PM

## 2019-02-28 NOTE — Consult Note (Signed)
ANTICOAGULATION CONSULT NOTE - Initial Consult  Pharmacy Consult for Heparin Infusion  Indication: chest pain/ACS  No Known Allergies  Patient Measurements: Height: 5\' 8"  (172.7 cm) Weight: 180 lb (81.6 kg) IBW/kg (Calculated) : 68.4  Vital Signs: Temp: 98 F (36.7 C) (01/23 1122) Temp Source: Oral (01/23 1122) BP: 102/89 (01/23 1115) Pulse Rate: 95 (01/23 1033)  Labs: Recent Labs    02/28/19 1026  HGB 13.9  HCT 42.0  PLT 260  CREATININE 0.97  TROPONINIHS 63*    Estimated Creatinine Clearance: 81.3 mL/min (by C-G formula based on SCr of 0.97 mg/dL).   Medical History: Past Medical History:  Diagnosis Date  . COPD (chronic obstructive pulmonary disease) (HCC)   . Hypertension    Assessment: Pharmacy consulted for heparin infusion dosing and monitoring for ACS/STEMI. On EMS arrival patient was found to in V tach. Amiodarone started.    Goal of Therapy:  Heparin level 0.3-0.7 units/ml Monitor platelets by anticoagulation protocol: Yes   Plan:  Baseline labs ordered Give 4000 units bolus x 1 Start heparin infusion at 1100 units/hr Check anti-Xa level in 6 hours and daily while on heparin Continue to monitor H&H and platelets  03/02/19, PharmD, BCPS Clinical Pharmacist 02/28/2019 11:33 AM

## 2019-02-28 NOTE — ED Triage Notes (Signed)
Pt arrived via EMS from home with report of CP and SHOB - on ems arrival pt noted to be sat'ing 85% and in vtach - pt given amnio 150mg  in route and of D5

## 2019-03-01 ENCOUNTER — Inpatient Hospital Stay (HOSPITAL_COMMUNITY)
Admit: 2019-03-01 | Discharge: 2019-03-01 | Disposition: A | Discharge status: Home / Self Care | Payer: Medicaid Other | Attending: Internal Medicine | Admitting: Internal Medicine

## 2019-03-01 DIAGNOSIS — I509 Heart failure, unspecified: Secondary | ICD-10-CM

## 2019-03-01 DIAGNOSIS — I342 Nonrheumatic mitral (valve) stenosis: Secondary | ICD-10-CM

## 2019-03-01 DIAGNOSIS — J9601 Acute respiratory failure with hypoxia: Secondary | ICD-10-CM

## 2019-03-01 DIAGNOSIS — I361 Nonrheumatic tricuspid (valve) insufficiency: Secondary | ICD-10-CM

## 2019-03-01 DIAGNOSIS — E785 Hyperlipidemia, unspecified: Secondary | ICD-10-CM

## 2019-03-01 DIAGNOSIS — I472 Ventricular tachycardia: Principal | ICD-10-CM

## 2019-03-01 DIAGNOSIS — I214 Non-ST elevation (NSTEMI) myocardial infarction: Secondary | ICD-10-CM

## 2019-03-01 LAB — BASIC METABOLIC PANEL
Anion gap: 12 (ref 5–15)
BUN: 40 mg/dL — ABNORMAL HIGH (ref 6–20)
CO2: 24 mmol/L (ref 22–32)
Calcium: 8 mg/dL — ABNORMAL LOW (ref 8.9–10.3)
Chloride: 99 mmol/L (ref 98–111)
Creatinine, Ser: 1.06 mg/dL (ref 0.61–1.24)
GFR calc Af Amer: >60 mL/min (ref >60)
GFR calc non Af Amer: >60 mL/min (ref >60)
Glucose, Bld: 102 mg/dL — ABNORMAL HIGH (ref 70–99)
Potassium: 3.8 mmol/L (ref 3.5–5.1)
Sodium: 135 mmol/L (ref 135–145)

## 2019-03-01 LAB — LIPID PANEL
Cholesterol: 162 mg/dL (ref 0–200)
HDL: 22 mg/dL — ABNORMAL LOW (ref >40)
LDL Cholesterol: 119 mg/dL — ABNORMAL HIGH (ref 0–99)
Total CHOL/HDL Ratio: 7.4 RATIO
Triglycerides: 104 mg/dL (ref <150)
VLDL: 21 mg/dL (ref 0–40)

## 2019-03-01 LAB — CBC
HCT: 41.4 % (ref 39.0–52.0)
Hemoglobin: 13.7 g/dL (ref 13.0–17.0)
MCH: 28.6 pg (ref 26.0–34.0)
MCHC: 33.1 g/dL (ref 30.0–36.0)
MCV: 86.4 fL (ref 80.0–100.0)
Platelets: 212 10*3/uL (ref 150–400)
RBC: 4.79 MIL/uL (ref 4.22–5.81)
RDW: 13.8 % (ref 11.5–15.5)
WBC: 12.5 10*3/uL — ABNORMAL HIGH (ref 4.0–10.5)
nRBC: 0 % (ref 0.0–0.2)

## 2019-03-01 LAB — HEPATITIS PANEL, ACUTE
HCV Ab: REACTIVE — AB
Hep A IgM: NONREACTIVE
Hep B C IgM: NONREACTIVE
Hepatitis B Surface Ag: NONREACTIVE

## 2019-03-01 LAB — HEPATITIS B SURFACE ANTIGEN: Hepatitis B Surface Ag: NONREACTIVE

## 2019-03-01 LAB — GLUCOSE, CAPILLARY
Glucose-Capillary: 96 mg/dL (ref 70–99)
Glucose-Capillary: 100 mg/dL — ABNORMAL HIGH (ref 70–99)
Glucose-Capillary: 125 mg/dL — ABNORMAL HIGH (ref 70–99)

## 2019-03-01 LAB — HEPARIN LEVEL (UNFRACTIONATED)
Heparin Unfractionated: <0.1 IU/mL — ABNORMAL LOW (ref 0.30–0.70)
Heparin Unfractionated: <0.1 IU/mL — ABNORMAL LOW (ref 0.30–0.70)
Heparin Unfractionated: 0.15 IU/mL — ABNORMAL LOW (ref 0.30–0.70)

## 2019-03-01 LAB — ECHOCARDIOGRAM COMPLETE
Height: 72 in
Weight: 3368.63 [oz_av]

## 2019-03-01 LAB — HIV ANTIBODY (ROUTINE TESTING W REFLEX): HIV Screen 4th Generation wRfx: NONREACTIVE

## 2019-03-01 LAB — PROCALCITONIN: Procalcitonin: 0.13 ng/mL

## 2019-03-01 MED ORDER — HEPARIN BOLUS VIA INFUSION
2000.0000 [IU] | Freq: Once | INTRAVENOUS | Status: AC
  Administered 2019-03-01: 2000 [IU] via INTRAVENOUS
  Filled 2019-03-01: qty 2000

## 2019-03-01 MED ORDER — FUROSEMIDE 10 MG/ML IJ SOLN
40.0000 mg | Freq: Every day | INTRAMUSCULAR | Status: DC
  Administered 2019-03-01: 40 mg via INTRAVENOUS
  Filled 2019-03-01: qty 4

## 2019-03-01 MED ORDER — POTASSIUM CHLORIDE CRYS ER 20 MEQ PO TBCR
40.0000 meq | EXTENDED_RELEASE_TABLET | Freq: Every day | ORAL | Status: DC
  Administered 2019-03-01: 40 meq via ORAL
  Filled 2019-03-01: qty 2

## 2019-03-01 MED ORDER — POTASSIUM CHLORIDE CRYS ER 20 MEQ PO TBCR
20.0000 meq | EXTENDED_RELEASE_TABLET | Freq: Two times a day (BID) | ORAL | Status: DC
  Administered 2019-03-01 – 2019-03-02 (×3): 20 meq via ORAL
  Filled 2019-03-01 (×3): qty 1

## 2019-03-01 MED ORDER — NICOTINE 21 MG/24HR TD PT24
21.0000 mg | MEDICATED_PATCH | Freq: Every day | TRANSDERMAL | Status: DC
  Administered 2019-03-01: 21 mg via TRANSDERMAL
  Filled 2019-03-01: qty 1

## 2019-03-01 MED ORDER — FUROSEMIDE 10 MG/ML IJ SOLN
40.0000 mg | Freq: Two times a day (BID) | INTRAMUSCULAR | Status: DC
  Administered 2019-03-01 – 2019-03-02 (×2): 40 mg via INTRAVENOUS
  Filled 2019-03-01 (×2): qty 4

## 2019-03-01 MED ORDER — POTASSIUM CHLORIDE CRYS ER 20 MEQ PO TBCR: 20.0000 meq | EXTENDED_RELEASE_TABLET | Freq: Two times a day (BID) | ORAL | Status: DC

## 2019-03-01 MED ORDER — HEPARIN BOLUS VIA INFUSION
3000.0000 [IU] | Freq: Once | INTRAVENOUS | Status: AC
  Administered 2019-03-01: 3000 [IU] via INTRAVENOUS
  Filled 2019-03-01: qty 3000

## 2019-03-01 MED ORDER — METOPROLOL SUCCINATE ER 25 MG PO TB24
12.5000 mg | ORAL_TABLET | Freq: Every day | ORAL | Status: DC
  Administered 2019-03-01: 12.5 mg via ORAL
  Filled 2019-03-01 (×2): qty 0.5

## 2019-03-01 NOTE — Progress Notes (Signed)
[facility] Cardiology    SUBJECTIVE: Patient feels reasonably well denies any significant chest pain today has not felt well for the last several weeks still smokes had palpitations or tachycardia no leg edema no previous cardiac history   Vitals:   03/01/19 1500 03/01/19 1600 03/01/19 1700 03/01/19 1800  BP: 122/90   119/79  Pulse: 95 97 85 87  Resp: (!) 22 (!) 26 19 20   Temp:  98.5 F (36.9 C)    TempSrc:  Axillary    SpO2: 95% 92% 97% 98%  Weight:      Height:         Intake/Output Summary (Last 24 hours) at 03/01/2019 1914 Last data filed at 03/01/2019 1800 Gross per 24 hour  Intake 748.67 ml  Output 1175 ml  Net -426.33 ml      PHYSICAL EXAM  General: Well developed, well nourished, in no acute distress HEENT:  Normocephalic and atramatic Neck:  No JVD.  Lungs: Clear bilaterally to auscultation and percussion. Heart: HRRR . Normal S1 and S2 without gallops or murmurs.  Abdomen: Bowel sounds are positive, abdomen soft and non-tender  Msk:  Back normal, normal gait. Normal strength and tone for age. Extremities: No clubbing, cyanosis or edema.   Neuro: Alert and oriented X 3. Psych:  Good affect, responds appropriately   LABS: Basic Metabolic Panel: Recent Labs    02/28/19 1026 03/01/19 0802  NA 135 135  K 3.6 3.8  CL 97* 99  CO2 24 24  GLUCOSE 176* 102*  BUN 36* 40*  CREATININE 0.97 1.06  CALCIUM 8.4* 8.0*  MG 2.0  --    Liver Function Tests: Recent Labs    02/28/19 1026  AST 96*  ALT 172*  ALKPHOS 76  BILITOT 1.6*  PROT 7.0  ALBUMIN 3.8   No results for input(s): LIPASE, AMYLASE in the last 72 hours. CBC: Recent Labs    02/28/19 1026 03/01/19 0443  WBC 14.4* 12.5*  HGB 13.9 13.7  HCT 42.0 41.4  MCV 85.9 86.4  PLT 260 212   Cardiac Enzymes: No results for input(s): CKTOTAL, CKMB, CKMBINDEX, TROPONINI in the last 72 hours. BNP: Invalid input(s): POCBNP D-Dimer: No results for input(s): DDIMER in the last 72 hours. Hemoglobin  A1C: Recent Labs    02/28/19 1616  HGBA1C 5.9*   Fasting Lipid Panel: Recent Labs    03/01/19 0443  CHOL 162  HDL 22*  LDLCALC 119*  TRIG 104  CHOLHDL 7.4   Thyroid Function Tests: Recent Labs    02/28/19 1817  TSH 2.095   Anemia Panel: No results for input(s): VITAMINB12, FOLATE, FERRITIN, TIBC, IRON, RETICCTPCT in the last 72 hours.  CT ANGIO CHEST PE W OR WO CONTRAST  Result Date: 02/28/2019 CLINICAL DATA:  Respiratory failure EXAM: CT ANGIOGRAPHY CHEST WITH CONTRAST TECHNIQUE: Multidetector CT imaging of the chest was performed using the standard protocol during bolus administration of intravenous contrast. Multiplanar CT image reconstructions and MIPs were obtained to evaluate the vascular anatomy. CONTRAST:  75mL OMNIPAQUE IOHEXOL 350 MG/ML SOLN COMPARISON:  None. FINDINGS: Cardiovascular: Contrast injection is sufficient to demonstrate satisfactory opacification of the pulmonary arteries to the segmental level. There is no pulmonary embolus. The main pulmonary artery is within normal limits for size. There is no CT evidence of acute right heart strain. Atherosclerotic changes are noted of the thoracic aorta. Heart size is enlarged. Coronary artery calcifications are noted. There is no significant pericardial effusion. There is reflux of contrast into the IVC consistent  with some degree of cardiac dysfunction. Mediastinum/Nodes: --enlarged mediastinal and hilar lymph nodes are noted. --No axillary lymphadenopathy. --No supraclavicular lymphadenopathy. --Normal thyroid gland. --The esophagus is unremarkable Lungs/Pleura: Emphysematous changes are noted bilaterally. There is interlobular septal thickening and scattered primarily perihilar ground-glass airspace opacities. Small bilateral pleural effusions are noted, right greater than left. The trachea is unremarkable. Upper Abdomen: There is a questionable nodular surface of the liver. There is a small amount of ascites in the upper  abdomen. Musculoskeletal: No chest wall abnormality. No acute or significant osseous findings. Review of the MIP images confirms the above findings. IMPRESSION: 1. No acute pulmonary embolism. 2. Cardiomegaly with small bilateral pleural effusions and findings suggestive of pulmonary edema. A superimposed atypical infectious process is difficult to exclude. There is reflux of contrast in the IVC consistent with underlying cardiac dysfunction. 3. Probable reactive mediastinal and hilar adenopathy. 4. Small volume ascites in the upper abdomen. 5. Questionable nodular surface of the liver which may indicate underlying cirrhosis. Aortic Atherosclerosis (ICD10-I70.0) and Emphysema (ICD10-J43.9). Electronically Signed   By: Constance Holster M.D.   On: 02/28/2019 17:30   DG Chest Portable 1 View  Result Date: 02/28/2019 CLINICAL DATA:  59 year old male with a history of chest pain and shortness of breath with V-tach EXAM: PORTABLE CHEST 1 VIEW COMPARISON:  None. FINDINGS: Cardiomediastinal silhouette borderline enlarged. Low lung volumes. Reticular opacities throughout the lungs. No confluent airspace disease. No pneumothorax. No large pleural effusion. Defibrillator pads on the low chest. Degenerative changes of the spine and bilateral shoulders IMPRESSION: Reticular opacity of the lungs may reflect early pulmonary edema and/or atypical infection. Defibrillator pads on the low chest wall. Electronically Signed   By: Corrie Mckusick D.O.   On: 02/28/2019 11:02   ECHOCARDIOGRAM COMPLETE  Result Date: 03/01/2019   ECHOCARDIOGRAM REPORT   Patient Name:   Brent [name] Date of Exam: 03/01/2019 Medical Rec #:  [id number]  Height:       72.0 in Accession #:    [id number] Weight:       210.5 lb Date of Birth:  12-13-1960 BSA:          2.18 m Patient Age:    59 years   BP:           123/90 mmHg Patient Gender: M          HR:           78 bpm. Exam Location:  [facility] Procedure: 2D Echo, Cardiac Doppler and Color Doppler  Indications:     Abnormal ECG 794.31  History:         Patient has no prior history of Echocardiogram examinations.                  COPD; Risk Factors:Hypertension.  Sonographer:     Alyse Low Roar Referring Phys:  [place] Diagnosing Phys: Ida Rogue MD IMPRESSIONS  1. Left ventricular ejection fraction, by visual estimation, is 25 to 30%. The left ventricle has severely decreased function. There is mildly increased left ventricular hypertrophy.  2. Mildly dilated left ventricular internal cavity size.  3. The left ventricle demonstrates global hypokinesis.  4. Global right ventricle has low normal systolic function.The right ventricular size is normal. No increase in right ventricular wall thickness.  5. Left atrial size was moderately dilated.  6. The pulmonic valve was normal in structure. Pulmonic valve regurgitation is not visualized.  7. Moderately elevated pulmonary artery systolic pressure.  8. The tricuspid regurgitant velocity  is 3.29 m/s, and with an assumed right atrial pressure of 10 mmHg, the estimated right ventricular systolic pressure is moderately elevated at 53.3 mmHg. FINDINGS  Left Ventricle: Left ventricular ejection fraction, by visual estimation, is 25 to 30%. The left ventricle has severely decreased function. The left ventricle demonstrates global hypokinesis. The left ventricular internal cavity size was mildly dilated left ventricle. There is mildly increased left ventricular hypertrophy. Normal left atrial pressure. Right Ventricle: The right ventricular size is normal. No increase in right ventricular wall thickness. Global RV systolic function is has low normal systolic function. The tricuspid regurgitant velocity is 3.29 m/s, and with an assumed right atrial pressure of 10 mmHg, the estimated right ventricular systolic pressure is moderately elevated at 53.3 mmHg. Left Atrium: Left atrial size was moderately dilated. Right Atrium: Right atrial size was normal in size  Pericardium: There is no evidence of pericardial effusion. Mitral Valve: The mitral valve is normal in structure. No evidence of mitral valve regurgitation. Moderate mitral valve stenosis by observation. Tricuspid Valve: The tricuspid valve is normal in structure. Tricuspid valve regurgitation is mild. Aortic Valve: The aortic valve is normal in structure. Aortic valve regurgitation is not visualized. The aortic valve is structurally normal, with no evidence of sclerosis or stenosis. Aortic valve mean gradient measures 3.0 mmHg. Aortic valve peak gradient measures 5.6 mmHg. Aortic valve area, by VTI measures 2.28 cm. Pulmonic Valve: The pulmonic valve was normal in structure. Pulmonic valve regurgitation is not visualized. Pulmonic regurgitation is not visualized. Aorta: The aortic root, ascending aorta and aortic arch are all structurally normal, with no evidence of dilitation or obstruction. Venous: The inferior vena cava is normal in size with greater than 50% respiratory variability, suggesting right atrial pressure of 3 mmHg. IAS/Shunts: No atrial level shunt detected by color flow Doppler. There is no evidence of a patent foramen ovale. No ventricular septal defect is seen or detected. There is no evidence of an atrial septal defect.  LEFT VENTRICLE PLAX 2D LVIDd:         6.07 cm       Diastology LVIDs:         5.35 cm       LV e' lateral:   4.79 cm/s LV PW:         1.14 cm       LV E/e' lateral: 21.5 LV IVS:        1.33 cm       LV e' medial:    6.74 cm/s LVOT diam:     2.10 cm       LV E/e' medial:  15.3 LV SV:         47 ml LV SV Index:   20.95 LVOT Area:     3.46 cm  LV Volumes (MOD) LV area d, A2C:    48.40 cm LV area d, A4C:    42.10 cm LV area s, A2C:    38.10 cm LV area s, A4C:    33.80 cm LV major d, A2C:   8.92 cm LV major d, A4C:   8.15 cm LV major s, A2C:   8.19 cm LV major s, A4C:   7.35 cm LV vol d, MOD A2C: 217.0 ml LV vol d, MOD A4C: 176.0 ml LV vol s, MOD A2C: 149.0 ml LV vol s, MOD A4C:  127.0 ml LV SV MOD A2C:     68.0 ml LV SV MOD A4C:     176.0 ml LV SV  MOD BP:      59.2 ml RIGHT VENTRICLE RV Mid diam:    3.50 cm RV S prime:     13.40 cm/s TAPSE (M-mode): 1.8 cm LEFT ATRIUM              Index       RIGHT ATRIUM           Index LA diam:        5.20 cm  2.39 cm/m  RA Area:     17.10 cm LA Vol (A2C):   109.0 ml 50.06 ml/m RA Volume:   49.50 ml  22.73 ml/m LA Vol (A4C):   93.8 ml  43.08 ml/m LA Biplane Vol: 105.0 ml 48.22 ml/m  AORTIC VALVE                   PULMONIC VALVE AV Area (Vmax):    2.11 cm    PV Vmax:        0.83 m/s AV Area (Vmean):   2.27 cm    PV Peak grad:   2.7 mmHg AV Area (VTI):     2.28 cm    RVOT Peak grad: 1 mmHg AV Vmax:           118.00 cm/s AV Vmean:          80.400 cm/s AV VTI:            0.158 m AV Peak Grad:      5.6 mmHg AV Mean Grad:      3.0 mmHg LVOT Vmax:         71.80 cm/s LVOT Vmean:        52.700 cm/s LVOT VTI:          0.104 m LVOT/AV VTI ratio: 0.66  AORTA Ao Root diam: 3.40 cm MITRAL VALVE                        TRICUSPID VALVE MV Area (PHT): 3.89 cm             TR Peak grad:   43.3 mmHg MV PHT:        56.55 msec           TR Vmax:        332.00 cm/s MV Decel Time: 195 msec MV E velocity: 103.00 cm/s 103 cm/s SHUNTS                                     Systemic VTI:  0.10 m                                     Systemic Diam: 2.10 cm  Julien Nordmann MD Electronically signed by Julien Nordmann MD Signature Date/Time: 03/01/2019/1:08:54 PM    Final      Echo echo suggests significant cardiomyopathy moderate to severe ejection fraction between 25 to 30% evaluate for ischemia  TELEMETRY: Normal sinus rhythm nonspecific ST-T wave changes  ASSESSMENT AND PLAN:  Active Problems:   Ventricular tachycardia (HCC)   Tobacco abuse counseling   SOB (shortness of breath)   NSTEMI (non-ST elevated myocardial infarction) (HCC)   Transaminitis   Acute respiratory failure with hypoxia (HCC)   Acute congestive heart failure (HCC)    Hyperlipidemia COPD Smoking . Plan Agree with admit to  ICU Recommend discontinue heparin Switch from IV amiodarone to p.o. Echocardiogram was helpful for assessment of left ventricular function and wall motion Recommend functional study for assessment of possible ischemia Consider ACE ARB or Entresto if significant cardiomyopathy Consider LifeVest or EP evaluation because of what appeared to be VT prior to discharge Consider AICD or reversible cause like coronary disease or ischemia is not found    Alwyn Pea, MD  03/01/2019 7:14 PM

## 2019-03-01 NOTE — Progress Notes (Signed)
Patient ID: Brent [name], male   DOB: [birth year], 59 y.o.   MRN: [id number] [facility] PROGRESS NOTE  Terrin Meddaugh JEH:[id number] DOB: [birth year] DOA: 02/28/2019 PCP: Patient, No Pcp Per  HPI/Subjective: Patient feeling a little bit better.  Was on BiPAP overnight and now on high flow nasal cannula.  He presented with shortness of breath and chest pain.  Objective: Vitals:   03/01/19 0900 03/01/19 1100  BP: 118/70 (!) 119/94  Pulse:    Resp: 16 19  Temp:    SpO2:      Intake/Output Summary (Last 24 hours) at 03/01/2019 1207 Last data filed at 03/01/2019 0850 Gross per 24 hour  Intake 621.81 ml  Output 600 ml  Net 21.81 ml   Filed Weights   02/28/19 1022 02/28/19 1547 03/01/19 0417  Weight: 81.6 kg 96 kg 95.5 kg    ROS: Review of Systems  Constitutional: Negative for chills and fever.  Eyes: Negative for blurred vision.  Respiratory: Positive for shortness of breath. Negative for cough.   Cardiovascular: Negative for chest pain.  Gastrointestinal: Negative for abdominal pain, constipation, diarrhea, nausea and vomiting.  Genitourinary: Negative for dysuria.  Musculoskeletal: Negative for joint pain.  Neurological: Negative for dizziness and headaches.   Exam: Physical Exam  Constitutional: He is oriented to person, place, and time.  HENT:  Nose: No mucosal edema.  Mouth/Throat: No oropharyngeal exudate or posterior oropharyngeal edema.  Eyes: Conjunctivae and lids are normal.  Neck: Carotid bruit is not present.  Cardiovascular: S1 normal and S2 normal. Exam reveals no gallop.  No murmur heard. Pulses:      Dorsalis pedis pulses are 2+ on the right side and 2+ on the left side.  Respiratory: No respiratory distress. He has decreased breath sounds in the right lower field and the left lower field. He has no wheezes. He has no rhonchi. He has rales in the right lower field and the left lower field.  GI: Soft. Bowel sounds are normal. There is no abdominal  tenderness.  Musculoskeletal:     Right ankle: No swelling.     Left ankle: No swelling.  Lymphadenopathy:    He has no cervical adenopathy.  Neurological: He is alert and oriented to person, place, and time. No cranial nerve deficit.  Skin: Skin is warm. No rash noted. Nails show no clubbing.  Psychiatric: He has a normal mood and affect.      Data Reviewed: Basic Metabolic Panel: Recent Labs  Lab 02/28/19 1026 03/01/19 0802  NA 135 135  K 3.6 3.8  CL 97* 99  CO2 24 24  GLUCOSE 176* 102*  BUN 36* 40*  CREATININE 0.97 1.06  CALCIUM 8.4* 8.0*  MG 2.0  --    Liver Function Tests: Recent Labs  Lab 02/28/19 1026  AST 96*  ALT 172*  ALKPHOS 76  BILITOT 1.6*  PROT 7.0  ALBUMIN 3.8   CBC: Recent Labs  Lab 02/28/19 1026 03/01/19 0443  WBC 14.4* 12.5*  HGB 13.9 13.7  HCT 42.0 41.4  MCV 85.9 86.4  PLT 260 212   BNP (last 3 results) Recent Labs    02/28/19 1026  BNP 1,216.0*    CBG: Recent Labs  Lab 02/28/19 1041 03/01/19 0723 03/01/19 1117  GLUCAP 154* 100* 125*    Recent Results (from the past 240 hour(s))  Respiratory Panel by RT PCR (Flu A&B, Covid) - Nasopharyngeal Swab     Status: None   Collection Time: 02/28/19 12:53 PM   Specimen:  Nasopharyngeal Swab  Result Value Ref Range Status   SARS Coronavirus 2 by RT PCR NEGATIVE NEGATIVE Final    Comment: (NOTE) SARS-CoV-2 target nucleic acids are NOT DETECTED. The SARS-CoV-2 RNA is generally detectable in upper respiratoy specimens during the acute phase of infection. The lowest concentration of SARS-CoV-2 viral copies this assay can detect is 131 copies/mL. A negative result does not preclude SARS-Cov-2 infection and should not be used as the sole basis for treatment or other patient management decisions. A negative result may occur with  improper specimen collection/handling, submission of specimen other than nasopharyngeal swab, presence of viral mutation(s) within the areas targeted by this  assay, and inadequate number of viral copies (<131 copies/mL). A negative result must be combined with clinical observations, patient history, and epidemiological information. The expected result is Negative. Fact Sheet for Patients:  https://www.moore.com/ Fact Sheet for Healthcare Providers:  https://www.young.biz/ This test is not yet ap proved or cleared by the [place] FDA and  has been authorized for detection and/or diagnosis of SARS-CoV-2 by FDA under an Emergency Use Authorization (EUA). This EUA will remain  in effect (meaning this test can be used) for the duration of the COVID-19 declaration under Section 564(b)(1) of the Act, 21 U.S.C. section 360bbb-3(b)(1), unless the authorization is terminated or revoked sooner.    Influenza A by PCR NEGATIVE NEGATIVE Final   Influenza B by PCR NEGATIVE NEGATIVE Final    Comment: (NOTE) The Xpert Xpress SARS-CoV-2/FLU/RSV assay is intended as an aid in  the diagnosis of influenza from Nasopharyngeal swab specimens and  should not be used as a sole basis for treatment. Nasal washings and  aspirates are unacceptable for Xpert Xpress SARS-CoV-2/FLU/RSV  testing. Fact Sheet for Patients: https://www.moore.com/ Fact Sheet for Healthcare Providers: https://www.young.biz/ This test is not yet approved or cleared by the [place] FDA and  has been authorized for detection and/or diagnosis of SARS-CoV-2 by  FDA under an Emergency Use Authorization (EUA). This EUA will remain  in effect (meaning this test can be used) for the duration of the  Covid-19 declaration under Section 564(b)(1) of the Act, 21  U.S.C. section 360bbb-3(b)(1), unless the authorization is  terminated or revoked. Performed at [facility], [address] [place], [place] [address]   MRSA PCR Screening     Status: None   Collection Time: 02/28/19  3:56 PM   Specimen:  Nasopharyngeal  Result Value Ref Range Status   MRSA by PCR NEGATIVE NEGATIVE Final    Comment:        The GeneXpert MRSA Assay (FDA approved for NASAL specimens only), is one component of a comprehensive MRSA colonization surveillance program. It is not intended to diagnose MRSA infection nor to guide or monitor treatment for MRSA infections. Performed at [facility], [address] [place], [place] [address]      Studies: CT ANGIO CHEST PE W OR WO CONTRAST  Result Date: 02/28/2019 CLINICAL DATA:  Respiratory failure EXAM: CT ANGIOGRAPHY CHEST WITH CONTRAST TECHNIQUE: Multidetector CT imaging of the chest was performed using the standard protocol during bolus administration of intravenous contrast. Multiplanar CT image reconstructions and MIPs were obtained to evaluate the vascular anatomy. CONTRAST:  60mL OMNIPAQUE IOHEXOL 350 MG/ML SOLN COMPARISON:  None. FINDINGS: Cardiovascular: Contrast injection is sufficient to demonstrate satisfactory opacification of the pulmonary arteries to the segmental level. There is no pulmonary embolus. The main pulmonary artery is within normal limits for size. There is no CT evidence of acute  right heart strain. Atherosclerotic changes are noted of the thoracic aorta. Heart size is enlarged. Coronary artery calcifications are noted. There is no significant pericardial effusion. There is reflux of contrast into the IVC consistent with some degree of cardiac dysfunction. Mediastinum/Nodes: --enlarged mediastinal and hilar lymph nodes are noted. --No axillary lymphadenopathy. --No supraclavicular lymphadenopathy. --Normal thyroid gland. --The esophagus is unremarkable Lungs/Pleura: Emphysematous changes are noted bilaterally. There is interlobular septal thickening and scattered primarily perihilar ground-glass airspace opacities. Small bilateral pleural effusions are noted, right greater than left. The trachea is unremarkable. Upper Abdomen: There is  a questionable nodular surface of the liver. There is a small amount of ascites in the upper abdomen. Musculoskeletal: No chest wall abnormality. No acute or significant osseous findings. Review of the MIP images confirms the above findings. IMPRESSION: 1. No acute pulmonary embolism. 2. Cardiomegaly with small bilateral pleural effusions and findings suggestive of pulmonary edema. A superimposed atypical infectious process is difficult to exclude. There is reflux of contrast in the IVC consistent with underlying cardiac dysfunction. 3. Probable reactive mediastinal and hilar adenopathy. 4. Small volume ascites in the upper abdomen. 5. Questionable nodular surface of the liver which may indicate underlying cirrhosis. Aortic Atherosclerosis (ICD10-I70.0) and Emphysema (ICD10-J43.9). Electronically Signed   By: Katherine Mantle M.D.   On: 02/28/2019 17:30   DG Chest Portable 1 View  Result Date: 02/28/2019 CLINICAL DATA:  59 year old male with a history of chest pain and shortness of breath with V-tach EXAM: PORTABLE CHEST 1 VIEW COMPARISON:  None. FINDINGS: Cardiomediastinal silhouette borderline enlarged. Low lung volumes. Reticular opacities throughout the lungs. No confluent airspace disease. No pneumothorax. No large pleural effusion. Defibrillator pads on the low chest. Degenerative changes of the spine and bilateral shoulders IMPRESSION: Reticular opacity of the lungs may reflect early pulmonary edema and/or atypical infection. Defibrillator pads on the low chest wall. Electronically Signed   By: Gilmer Mor D.O.   On: 02/28/2019 11:02    Scheduled Meds: . aspirin EC  81 mg Oral Daily  . atorvastatin  80 mg Oral q1800  . Chlorhexidine Gluconate Cloth  6 each Topical Daily  . furosemide  40 mg Intravenous Daily  . potassium chloride  40 mEq Oral Daily   Continuous Infusions: . amiodarone 30 mg/hr (03/01/19 0800)  . heparin 1,500 Units/hr (03/01/19 0272)     Assessment/Plan:  1. Ventricular tachycardia.  Currently on amiodarone drip to maintain sinus rhythm. 2. Chest pain with elevated troponin which could be demand ischemia versus NSTEMI.  Cardiology to decide on cardiac cath.  Patient on aspirin and heparin drip. 3. Acute hypoxic respiratory failure.  Patient was on BiPAP last night and currently on high flow nasal cannula.  Started Lasix to get rid of fluid. 4. Acute congestive heart failure.  Currently echocardiogram pending.  Continue Lasix to get rid of fluid.  Will start low-dose Toprol XL. 5. Hyperlipidemia started on Lipitor.  LDL 119. 6. Elevated liver enzymes with questionable nodular surface of the liver which may indicate underlying cirrhosis.  I will send off hepatitis profile.  Code Status:     Code Status Orders  (From admission, onward)         Start     Ordered   02/28/19 1454  Full code  Continuous     02/28/19 1457        Code Status History    This patient has a current code status but no historical code status.   Advance Care Planning Activity  Family Communication: Left message for patient's wife Disposition Plan: To be determined  Consultants:  Cardiology  Time spent: 28 minutes  [employer]

## 2019-03-01 NOTE — Consult Note (Addendum)
ANTICOAGULATION CONSULT NOTE - Initial Consult  Pharmacy Consult for Heparin Infusion  Indication: chest pain/ACS  No Known Allergies  Patient Measurements: Height: 6' (182.9 cm) Weight: 210 lb 8.6 oz (95.5 kg) IBW/kg (Calculated) : 77.6  Vital Signs: Temp: 97.6 F (36.4 C) (01/24 0800) Temp Source: Oral (01/24 0800) BP: 123/90 (01/24 0800) Pulse Rate: 78 (01/24 0800)  Labs: Recent Labs    02/28/19 1026 02/28/19 1241 02/28/19 1817 03/01/19 0002 03/01/19 0443 03/01/19 0802  HGB 13.9  --   --   --  13.7  --   HCT 42.0  --   --   --  41.4  --   PLT 260  --   --   --  212  --   APTT 29  --   --   --   --   --   LABPROT 15.1  --   --   --   --   --   INR 1.2  --   --   --   --   --   HEPARINUNFRC  --   --  0.38 <0.10*  --  <0.10*  CREATININE 0.97  --   --   --   --  1.06  TROPONINIHS 63* 62*  --   --   --   --     Estimated Creatinine Clearance: 92.2 mL/min (by C-G formula based on SCr of 1.06 mg/dL).   Medical History: Past Medical History:  Diagnosis Date  . COPD (chronic obstructive pulmonary disease) (HCC)   . Hypertension    Assessment: Pharmacy consulted for heparin infusion dosing and monitoring for ACS/STEMI. On EMS arrival patient was found to in V tach. Amiodarone started.    1/23:  HL @ 1817 = 0.38 Will continue pt on current rate .  1/24 @ 0004 HL < 0.1, confirmed w/ RN no problems w/ infusion.  Will rebolus w/ 2000 units x 1 then increase infusion to 1300 units/hr.   1/24 HL @ 0802 < 0.1 confirmed w/ RN no problems w/ infusion. Will rebolus w/ 2000 units x 1 then increase infusion to 1500 units/hr.   Goal of Therapy:  Heparin level 0.3-0.7 units/ml Monitor platelets by anticoagulation protocol: Yes   Plan:  Heparin level subtherapeutic. Will give a 2000 unit bolus and increase the rate to 1500 units/hr. Daily CBC while on heparin. Will order a 6 hour heparin level.   Ronnald Ramp, PharmD, BCPS Clinical Pharmacist 03/01/2019 8:47 AM

## 2019-03-01 NOTE — Progress Notes (Signed)
*  PRELIMINARY RESULTS* Echocardiogram 2D Echocardiogram has been performed.  Brent [name] 03/01/2019, 12:01 PM

## 2019-03-01 NOTE — Consult Note (Signed)
ANTICOAGULATION CONSULT NOTE - Initial Consult  Pharmacy Consult for Heparin Infusion  Indication: chest pain/ACS  No Known Allergies  Patient Measurements: Height: 6' (182.9 cm) Weight: 210 lb 8.6 oz (95.5 kg) IBW/kg (Calculated) : 77.6  Vital Signs: Temp: 98 F (36.7 C) (01/24 1200) Temp Source: Oral (01/24 1200) BP: 119/94 (01/24 1100) Pulse Rate: 125 (01/24 1200)  Labs: Recent Labs    02/28/19 1026 02/28/19 1241 02/28/19 1817 03/01/19 0002 03/01/19 0443 03/01/19 0802 03/01/19 1504  HGB 13.9  --   --   --  13.7  --   --   HCT 42.0  --   --   --  41.4  --   --   PLT 260  --   --   --  212  --   --   APTT 29  --   --   --   --   --   --   LABPROT 15.1  --   --   --   --   --   --   INR 1.2  --   --   --   --   --   --   HEPARINUNFRC  --   --    < > <0.10*  --  <0.10* 0.15*  CREATININE 0.97  --   --   --   --  1.06  --   TROPONINIHS 63* 62*  --   --   --   --   --    < > = values in this interval not displayed.    Estimated Creatinine Clearance: 92.2 mL/min (by C-G formula based on SCr of 1.06 mg/dL).   Medical History: Past Medical History:  Diagnosis Date  . COPD (chronic obstructive pulmonary disease) (HCC)   . Hypertension    Assessment: Pharmacy consulted for heparin infusion dosing and monitoring for ACS/STEMI. On EMS arrival patient was found to in V tach. Amiodarone started.    1/23:  HL @ 1817 = 0.38 Will continue pt on current rate .  1/24 @ 0004 HL < 0.1, confirmed w/ RN no problems w/ infusion.  Will rebolus w/ 2000 units x 1 then increase infusion to 1300 units/hr.   1/24 HL @ 0802 < 0.1 confirmed w/ RN no problems w/ infusion. Will rebolus w/ 2000 units x 1 then increase infusion to 1500 units/hr.   Goal of Therapy:  Heparin level 0.3-0.7 units/ml Monitor platelets by anticoagulation protocol: Yes   Plan:  Heparin level subtherapeutic. Will give a 2000 unit bolus and increase the rate to 1500 units/hr. Daily CBC while on heparin. Will order  a 6 hour heparin level.   1/24: HL @ 1500 = 0.15 Will order Heparin 3000 units IV X 1 bolus and increase drip rate to 1900 units/hr.  Will recheck HL 6 hrs after rate change.   Scherrie Gerlach, PharmD Clinical Pharmacist 03/01/2019 5:29 PM

## 2019-03-01 NOTE — Consult Note (Signed)
ANTICOAGULATION CONSULT NOTE - Initial Consult  Pharmacy Consult for Heparin Infusion  Indication: chest pain/ACS  No Known Allergies  Patient Measurements: Height: 6' (182.9 cm) Weight: 211 lb 10.3 oz (96 kg) IBW/kg (Calculated) : 77.6  Vital Signs: Temp: 98.2 F (36.8 C) (01/23 2000) Temp Source: Axillary (01/23 2000) BP: 119/92 (01/23 2300) Pulse Rate: 80 (01/23 2300)  Labs: Recent Labs    02/28/19 1026 02/28/19 1241 02/28/19 1817 03/01/19 0002  HGB 13.9  --   --   --   HCT 42.0  --   --   --   PLT 260  --   --   --   APTT 29  --   --   --   LABPROT 15.1  --   --   --   INR 1.2  --   --   --   HEPARINUNFRC  --   --  0.38 <0.10*  CREATININE 0.97  --   --   --   TROPONINIHS 63* 62*  --   --     Estimated Creatinine Clearance: 101 mL/min (by C-G formula based on SCr of 0.97 mg/dL).   Medical History: Past Medical History:  Diagnosis Date  . COPD (chronic obstructive pulmonary disease) (HCC)   . Hypertension    Assessment: Pharmacy consulted for heparin infusion dosing and monitoring for ACS/STEMI. On EMS arrival patient was found to in V tach. Amiodarone started.    Goal of Therapy:  Heparin level 0.3-0.7 units/ml Monitor platelets by anticoagulation protocol: Yes   Plan:  Baseline labs ordered Give 4000 units bolus x 1 Start heparin infusion at 1100 units/hr Check anti-Xa level in 6 hours and daily while on heparin Continue to monitor H&H and platelets  1/23:  HL @ 1817 = 0.38 Will continue pt on current rate and recheck HL in 6 hrs on 1/24 @ 0000.  1/24 @ 0004 HL < 0.10, confirmed w/ RN no problems w/ infusion.  Will rebolus w/ 2000 units x 1 then increase infusion to 1300 units/hr.  Recheck HL in 6 hours  Wayland Denis, PharmD Clinical Pharmacist 03/01/2019 1:28 AM

## 2019-03-02 ENCOUNTER — Other Ambulatory Visit: Payer: Self-pay

## 2019-03-02 ENCOUNTER — Emergency Department: Payer: Medicaid Other

## 2019-03-02 ENCOUNTER — Encounter: Payer: Self-pay | Admitting: Internal Medicine

## 2019-03-02 ENCOUNTER — Inpatient Hospital Stay: Payer: Medicaid Other

## 2019-03-02 ENCOUNTER — Inpatient Hospital Stay
Admission: EM | Admit: 2019-03-02 | Discharge: 2019-03-05 | DRG: 280 | Disposition: A | Discharge status: Home / Self Care | Payer: Medicaid Other | Attending: Family Medicine | Admitting: Family Medicine

## 2019-03-02 DIAGNOSIS — I5021 Acute systolic (congestive) heart failure: Secondary | ICD-10-CM

## 2019-03-02 DIAGNOSIS — I214 Non-ST elevation (NSTEMI) myocardial infarction: Principal | ICD-10-CM | POA: Diagnosis present

## 2019-03-02 DIAGNOSIS — R079 Chest pain, unspecified: Secondary | ICD-10-CM

## 2019-03-02 DIAGNOSIS — Z20822 Contact with and (suspected) exposure to covid-19: Secondary | ICD-10-CM | POA: Diagnosis present

## 2019-03-02 DIAGNOSIS — B182 Chronic viral hepatitis C: Secondary | ICD-10-CM | POA: Diagnosis present

## 2019-03-02 DIAGNOSIS — F17210 Nicotine dependence, cigarettes, uncomplicated: Secondary | ICD-10-CM | POA: Diagnosis present

## 2019-03-02 DIAGNOSIS — I252 Old myocardial infarction: Secondary | ICD-10-CM

## 2019-03-02 DIAGNOSIS — I5022 Chronic systolic (congestive) heart failure: Secondary | ICD-10-CM

## 2019-03-02 DIAGNOSIS — J441 Chronic obstructive pulmonary disease with (acute) exacerbation: Secondary | ICD-10-CM

## 2019-03-02 DIAGNOSIS — B1910 Unspecified viral hepatitis B without hepatic coma: Secondary | ICD-10-CM

## 2019-03-02 DIAGNOSIS — R0902 Hypoxemia: Secondary | ICD-10-CM | POA: Diagnosis present

## 2019-03-02 DIAGNOSIS — K7469 Other cirrhosis of liver: Secondary | ICD-10-CM

## 2019-03-02 DIAGNOSIS — I5023 Acute on chronic systolic (congestive) heart failure: Secondary | ICD-10-CM | POA: Diagnosis present

## 2019-03-02 DIAGNOSIS — Z8679 Personal history of other diseases of the circulatory system: Secondary | ICD-10-CM

## 2019-03-02 DIAGNOSIS — I509 Heart failure, unspecified: Secondary | ICD-10-CM

## 2019-03-02 DIAGNOSIS — Z79899 Other long term (current) drug therapy: Secondary | ICD-10-CM

## 2019-03-02 DIAGNOSIS — I472 Ventricular tachycardia, unspecified: Secondary | ICD-10-CM

## 2019-03-02 DIAGNOSIS — G8929 Other chronic pain: Secondary | ICD-10-CM | POA: Diagnosis present

## 2019-03-02 DIAGNOSIS — J449 Chronic obstructive pulmonary disease, unspecified: Secondary | ICD-10-CM | POA: Diagnosis present

## 2019-03-02 DIAGNOSIS — I110 Hypertensive heart disease with heart failure: Secondary | ICD-10-CM | POA: Diagnosis present

## 2019-03-02 DIAGNOSIS — Z79891 Long term (current) use of opiate analgesic: Secondary | ICD-10-CM

## 2019-03-02 DIAGNOSIS — I2510 Atherosclerotic heart disease of native coronary artery without angina pectoris: Secondary | ICD-10-CM

## 2019-03-02 DIAGNOSIS — E785 Hyperlipidemia, unspecified: Secondary | ICD-10-CM | POA: Diagnosis present

## 2019-03-02 DIAGNOSIS — I248 Other forms of acute ischemic heart disease: Secondary | ICD-10-CM | POA: Diagnosis present

## 2019-03-02 LAB — NM MYOCAR MULTI W/SPECT W/WALL MOTION / EF
Estimated workload: 1 METS
Exercise duration (min): 1 min
Exercise duration (sec): 0 s
LV dias vol: 219 mL (ref 62–150)
LV sys vol: 171 mL
Peak HR: 80 {beats}/min
Percent HR: 49 %
Rest HR: 71 {beats}/min
SDS: 1
SRS: 27
SSS: 25
TID: 1.02

## 2019-03-02 LAB — BASIC METABOLIC PANEL
Anion gap: 9 (ref 5–15)
BUN: 31 mg/dL — ABNORMAL HIGH (ref 6–20)
CO2: 28 mmol/L (ref 22–32)
Calcium: 7.7 mg/dL — ABNORMAL LOW (ref 8.9–10.3)
Chloride: 99 mmol/L (ref 98–111)
Creatinine, Ser: 0.73 mg/dL (ref 0.61–1.24)
GFR calc Af Amer: >60 mL/min (ref >60)
GFR calc non Af Amer: >60 mL/min (ref >60)
Glucose, Bld: 120 mg/dL — ABNORMAL HIGH (ref 70–99)
Potassium: 4 mmol/L (ref 3.5–5.1)
Sodium: 136 mmol/L (ref 135–145)

## 2019-03-02 LAB — CBC
HCT: 35.5 % — ABNORMAL LOW (ref 39.0–52.0)
HCT: 35.5 % — ABNORMAL LOW (ref 39.0–52.0)
Hemoglobin: 11.8 g/dL — ABNORMAL LOW (ref 13.0–17.0)
Hemoglobin: 11.9 g/dL — ABNORMAL LOW (ref 13.0–17.0)
MCH: 28.8 pg (ref 26.0–34.0)
MCH: 29.2 pg (ref 26.0–34.0)
MCHC: 33.2 g/dL (ref 30.0–36.0)
MCHC: 33.5 g/dL (ref 30.0–36.0)
MCV: 86.6 fL (ref 80.0–100.0)
MCV: 87 fL (ref 80.0–100.0)
Platelets: 155 10*3/uL (ref 150–400)
Platelets: 176 10*3/uL (ref 150–400)
RBC: 4.08 MIL/uL — ABNORMAL LOW (ref 4.22–5.81)
RBC: 4.1 MIL/uL — ABNORMAL LOW (ref 4.22–5.81)
RDW: 13.7 % (ref 11.5–15.5)
RDW: 14 % (ref 11.5–15.5)
WBC: 10.7 10*3/uL — ABNORMAL HIGH (ref 4.0–10.5)
WBC: 11.3 10*3/uL — ABNORMAL HIGH (ref 4.0–10.5)
nRBC: 0 % (ref 0.0–0.2)
nRBC: 0 % (ref 0.0–0.2)

## 2019-03-02 LAB — PROCALCITONIN: Procalcitonin: 0.16 ng/mL

## 2019-03-02 LAB — HEPARIN LEVEL (UNFRACTIONATED)
Heparin Unfractionated: 0.29 IU/mL — ABNORMAL LOW (ref 0.30–0.70)
Heparin Unfractionated: 0.37 IU/mL (ref 0.30–0.70)

## 2019-03-02 LAB — HEPATITIS B CORE ANTIBODY, TOTAL: Hep B Core Total Ab: REACTIVE — AB

## 2019-03-02 LAB — HEPATITIS C ANTIBODY: HCV Ab: REACTIVE — AB

## 2019-03-02 MED ORDER — TECHNETIUM TC 99M TETROFOSMIN IV KIT
10.0000 | PACK | Freq: Once | INTRAVENOUS | Status: AC | PRN
  Administered 2019-03-02: 10.42 via INTRAVENOUS

## 2019-03-02 MED ORDER — REGADENOSON 0.4 MG/5ML IV SOLN
0.4000 mg | Freq: Once | INTRAVENOUS | Status: AC
  Administered 2019-03-02: 13:00:00 0.4 mg via INTRAVENOUS

## 2019-03-02 MED ORDER — METHYLPREDNISOLONE SODIUM SUCC 40 MG IJ SOLR
40.0000 mg | Freq: Every day | INTRAMUSCULAR | Status: DC
  Administered 2019-03-02: 40 mg via INTRAVENOUS
  Filled 2019-03-02: qty 1

## 2019-03-02 MED ORDER — AMIODARONE HCL 200 MG PO TABS
400.0000 mg | ORAL_TABLET | Freq: Two times a day (BID) | ORAL | Status: DC
  Administered 2019-03-02: 400 mg via ORAL
  Filled 2019-03-02: qty 2

## 2019-03-02 MED ORDER — IPRATROPIUM-ALBUTEROL 0.5-2.5 (3) MG/3ML IN SOLN: 3.0000 mL | Freq: Four times a day (QID) | RESPIRATORY_TRACT | Status: DC

## 2019-03-02 MED ORDER — TECHNETIUM TC 99M TETROFOSMIN IV KIT
32.6200 | PACK | Freq: Once | INTRAVENOUS | Status: AC | PRN
  Administered 2019-03-02: 32.62 via INTRAVENOUS

## 2019-03-02 MED ORDER — HEPARIN (PORCINE) 25000 UT/250ML-% IV SOLN
2100.0000 [IU]/h | INTRAVENOUS | Status: DC
  Filled 2019-03-02: qty 250

## 2019-03-02 NOTE — Discharge Summary (Signed)
[facility] [place] [facility]   PATIENT NAME: Brent [name]    MR#:  [id number]  DATE OF BIRTH:  Dec 05, 1960  DATE OF ADMISSION:  02/28/2019 ADMITTING PHYSICIAN: Gertha Calkin, MD  DATE OF signing out Against medical advise: 03/02/2019  3:40 PM  PRIMARY CARE PHYSICIAN: Patient, No Pcp Per    ADMISSION DIAGNOSIS:  Ventricular tachycardia (HCC) [I47.2] Ventricular tachycardia, sustained (HCC) [I47.2]  DISCHARGE DIAGNOSIS:  Active Problems:   Ventricular tachycardia (HCC)   Tobacco abuse counseling   SOB (shortness of breath)   NSTEMI (non-ST elevated myocardial infarction) (HCC)   Transaminitis   Acute respiratory failure with hypoxia (HCC)   Acute congestive heart failure (HCC)   Hyperlipidemia   Acute systolic CHF (congestive heart failure) (HCC)   COPD with acute exacerbation (HCC)   Other cirrhosis of liver (HCC)   Chronic hepatitis C without hepatic coma (HCC)   Hep B w/o coma   SECONDARY DIAGNOSIS:   Past Medical History:  Diagnosis Date  . COPD (chronic obstructive pulmonary disease) (HCC)   . Hypertension     HOSPITAL COURSE:   Patient signed out AMA.  I was notified after he was already gone.  I called his wife on the phone and they stated that he is going to come back to the hospital.  I advised that he has to come back to the emergency room because they already have taken amount of the computer.  1.  Ventricular tachycardia with cardiomyopathy.  The patient was initially started on amiodarone drip.  I mentioned to the patient that he is a candidate for LifeVest upon discharge home and possibly defibrillator in the future.  I switched his amiodarone over to oral prior to him signing out AGAINST MEDICAL ADVICE. 2.  Chest pain and elevated troponin concerning for NSTEMI.  Patient had a stress test by cardiology which was still pending at the time that he signed out AGAINST MEDICAL ADVICE but it was an intermediate study with low ejection  fraction.  Medium defect of moderate severity present in the basal inferior basal inferior lateral and mid inferior and mid inferior lateral locations.  Patient was on heparin drip when he pulled out his IVs. 3.  Acute hypoxic respiratory failure.  The patient was on high flow nasal cannula 10 L this morning.  I tapered him down to 8 L and hopefully over time we would taper him down further.  He was started on IV Lasix to get rid of fluid.  Patient pulled off his oxygen and signed out AGAINST MEDICAL ADVICE. 4.  Acute systolic congestive heart failure with an EF of 25 to 30%.  The patient was on IV Lasix to get rid of fluid on low-dose metoprolol.  Consulted the transitional care team about LifeVest referral.  Other medications will be added depending on blood pressure 5.  COPD exacerbation I started steroids and nebulizers 6.  Hyperlipidemia I started him on Lipitor his LDL was 119 7.  Cirrhosis liver, elevated liver function test.  Both hepatitis B and C were both positive.  I advised the wife to check her hepatitis profiles 8.  Tobacco abuse on nicotine patch.  I spoke with the wife on the phone about bringing him back to the hospital because there is a high likelihood of cardiopulmonary arrest without proper medical management.    DISCHARGE CONDITIONS:   The patient signed out AGAINST MEDICAL ADVICE and he was in the stepdown unit in no shape to go  home.  High risk for cardiopulmonary arrest  CONSULTS OBTAINED:  Cardiology  DRUG ALLERGIES:  No Known Allergies  DISCHARGE MEDICATIONS:   Patient left prior to me knowing that he left.  His wife states that she will bring him back to the hospital.  DISCHARGE INSTRUCTIONS:   I spoke with the wife on the phone to bring him back to the hospital for medical management    DATA REVIEW:   CBC Recent Labs  Lab 03/02/19 0725  WBC 11.3*  HGB 11.9*  HCT 35.5*  PLT 155    Chemistries  Recent Labs  Lab 02/28/19 1026 03/01/19 0802  03/02/19 0725  NA 135   < > 136  K 3.6   < > 4.0  CL 97*   < > 99  CO2 24   < > 28  GLUCOSE 176*   < > 120*  BUN 36*   < > 31*  CREATININE 0.97   < > 0.73  CALCIUM 8.4*   < > 7.7*  MG 2.0  --   --   AST 96*  --   --   ALT 172*  --   --   ALKPHOS 76  --   --   BILITOT 1.6*  --   --    < > = values in this interval not displayed.    Cardiac Enzymes No results for input(s): TROPONINI in the last 168 hours.  Microbiology Results  Results for orders placed or performed during the hospital encounter of 02/28/19  Respiratory Panel by RT PCR (Flu A&B, Covid) - Nasopharyngeal Swab     Status: None   Collection Time: 02/28/19 12:53 PM   Specimen: Nasopharyngeal Swab  Result Value Ref Range Status   SARS Coronavirus 2 by RT PCR NEGATIVE NEGATIVE Final    Comment: (NOTE) SARS-CoV-2 target nucleic acids are NOT DETECTED. The SARS-CoV-2 RNA is generally detectable in upper respiratoy specimens during the acute phase of infection. The lowest concentration of SARS-CoV-2 viral copies this assay can detect is 131 copies/mL. A negative result does not preclude SARS-Cov-2 infection and should not be used as the sole basis for treatment or other patient management decisions. A negative result may occur with  improper specimen collection/handling, submission of specimen other than nasopharyngeal swab, presence of viral mutation(s) within the areas targeted by this assay, and inadequate number of viral copies (<131 copies/mL). A negative result must be combined with clinical observations, patient history, and epidemiological information. The expected result is Negative. Fact Sheet for Patients:  https://www.moore.com/ Fact Sheet for Healthcare Providers:  https://www.young.biz/ This test is not yet ap proved or cleared by the [place] FDA and  has been authorized for detection and/or diagnosis of SARS-CoV-2 by FDA under an Emergency Use  Authorization (EUA). This EUA will remain  in effect (meaning this test can be used) for the duration of the COVID-19 declaration under Section 564(b)(1) of the Act, 21 U.S.C. section 360bbb-3(b)(1), unless the authorization is terminated or revoked sooner.    Influenza A by PCR NEGATIVE NEGATIVE Final   Influenza B by PCR NEGATIVE NEGATIVE Final    Comment: (NOTE) The Xpert Xpress SARS-CoV-2/FLU/RSV assay is intended as an aid in  the diagnosis of influenza from Nasopharyngeal swab specimens and  should not be used as a sole basis for treatment. Nasal washings and  aspirates are unacceptable for Xpert Xpress SARS-CoV-2/FLU/RSV  testing. Fact Sheet for Patients: https://www.moore.com/ Fact Sheet for Healthcare Providers: https://www.young.biz/ This test is not yet  approved or cleared by the [place] and  has been authorized for detection and/or diagnosis of SARS-CoV-2 by  FDA under an Emergency Use Authorization (EUA). This EUA will remain  in effect (meaning this test can be used) for the duration of the  Covid-19 declaration under Section 564(b)(1) of the Act, 21  U.S.C. section 360bbb-3(b)(1), unless the authorization is  terminated or revoked. Performed at [facility], [address] [place], [place] [address]   MRSA PCR Screening     Status: None   Collection Time: 02/28/19  3:56 PM   Specimen: Nasopharyngeal  Result Value Ref Range Status   MRSA by PCR NEGATIVE NEGATIVE Final    Comment:        The GeneXpert MRSA Assay (FDA approved for NASAL specimens only), is one component of a comprehensive MRSA colonization surveillance program. It is not intended to diagnose MRSA infection nor to guide or monitor treatment for MRSA infections. Performed at [facility], [address] [place], [place] [address]     RADIOLOGY:  CT ANGIO CHEST PE W OR WO CONTRAST  Result Date: 02/28/2019 CLINICAL DATA:   Respiratory failure EXAM: CT ANGIOGRAPHY CHEST WITH CONTRAST TECHNIQUE: Multidetector CT imaging of the chest was performed using the standard protocol during bolus administration of intravenous contrast. Multiplanar CT image reconstructions and MIPs were obtained to evaluate the vascular anatomy. CONTRAST:  52mL OMNIPAQUE IOHEXOL 350 MG/ML SOLN COMPARISON:  None. FINDINGS: Cardiovascular: Contrast injection is sufficient to demonstrate satisfactory opacification of the pulmonary arteries to the segmental level. There is no pulmonary embolus. The main pulmonary artery is within normal limits for size. There is no CT evidence of acute right heart strain. Atherosclerotic changes are noted of the thoracic aorta. Heart size is enlarged. Coronary artery calcifications are noted. There is no significant pericardial effusion. There is reflux of contrast into the IVC consistent with some degree of cardiac dysfunction. Mediastinum/Nodes: --enlarged mediastinal and hilar lymph nodes are noted. --No axillary lymphadenopathy. --No supraclavicular lymphadenopathy. --Normal thyroid gland. --The esophagus is unremarkable Lungs/Pleura: Emphysematous changes are noted bilaterally. There is interlobular septal thickening and scattered primarily perihilar ground-glass airspace opacities. Small bilateral pleural effusions are noted, right greater than left. The trachea is unremarkable. Upper Abdomen: There is a questionable nodular surface of the liver. There is a small amount of ascites in the upper abdomen. Musculoskeletal: No chest wall abnormality. No acute or significant osseous findings. Review of the MIP images confirms the above findings. IMPRESSION: 1. No acute pulmonary embolism. 2. Cardiomegaly with small bilateral pleural effusions and findings suggestive of pulmonary edema. A superimposed atypical infectious process is difficult to exclude. There is reflux of contrast in the IVC consistent with underlying cardiac  dysfunction. 3. Probable reactive mediastinal and hilar adenopathy. 4. Small volume ascites in the upper abdomen. 5. Questionable nodular surface of the liver which may indicate underlying cirrhosis. Aortic Atherosclerosis (ICD10-I70.0) and Emphysema (ICD10-J43.9). Electronically Signed   By: Constance Holster M.D.   On: 02/28/2019 17:30   NM Myocar Multi W/Spect W/Wall Motion / EF  Result Date: 03/02/2019  Blood pressure demonstrated a normal response to exercise.  There was no ST segment deviation noted during stress.  Defect 1: There is a medium defect of moderate severity present in the basal inferior, basal inferolateral, mid inferior and mid inferolateral location.  Findings consistent with prior myocardial infarction with peri-infarct ischemia.  This is an intermediate risk study.  The left ventricular ejection fraction is moderately decreased (30-44%).  ECHOCARDIOGRAM COMPLETE  Result Date: 03/01/2019   ECHOCARDIOGRAM REPORT   Patient Name:   NELVIN TOMB Date of Exam: 03/01/2019 Medical Rec #:  [id number]  Height:       72.0 in Accession #:    [id number] Weight:       210.5 lb Date of Birth:  01/03/1961 BSA:          2.18 m Patient Age:    58 years   BP:           123/90 mmHg Patient Gender: M          HR:           78 bpm. Exam Location:  [facility] Procedure: 2D Echo, Cardiac Doppler and Color Doppler Indications:     Abnormal ECG 794.31  History:         Patient has no prior history of Echocardiogram examinations.                  COPD; Risk Factors:Hypertension.  Sonographer:     Neysa Bonito Roar Referring Phys:  [id number] Eliezer Mccoy PATEL Diagnosing Phys: Julien Nordmann MD IMPRESSIONS  1. Left ventricular ejection fraction, by visual estimation, is 25 to 30%. The left ventricle has severely decreased function. There is mildly increased left ventricular hypertrophy.  2. Mildly dilated left ventricular internal cavity size.  3. The left ventricle demonstrates global hypokinesis.  4. Global right ventricle  has low normal systolic function.The right ventricular size is normal. No increase in right ventricular wall thickness.  5. Left atrial size was moderately dilated.  6. The pulmonic valve was normal in structure. Pulmonic valve regurgitation is not visualized.  7. Moderately elevated pulmonary artery systolic pressure.  8. The tricuspid regurgitant velocity is 3.29 m/s, and with an assumed right atrial pressure of 10 mmHg, the estimated right ventricular systolic pressure is moderately elevated at 53.3 mmHg. FINDINGS  Left Ventricle: Left ventricular ejection fraction, by visual estimation, is 25 to 30%. The left ventricle has severely decreased function. The left ventricle demonstrates global hypokinesis. The left ventricular internal cavity size was mildly dilated left ventricle. There is mildly increased left ventricular hypertrophy. Normal left atrial pressure. Right Ventricle: The right ventricular size is normal. No increase in right ventricular wall thickness. Global RV systolic function is has low normal systolic function. The tricuspid regurgitant velocity is 3.29 m/s, and with an assumed right atrial pressure of 10 mmHg, the estimated right ventricular systolic pressure is moderately elevated at 53.3 mmHg. Left Atrium: Left atrial size was moderately dilated. Right Atrium: Right atrial size was normal in size Pericardium: There is no evidence of pericardial effusion. Mitral Valve: The mitral valve is normal in structure. No evidence of mitral valve regurgitation. Moderate mitral valve stenosis by observation. Tricuspid Valve: The tricuspid valve is normal in structure. Tricuspid valve regurgitation is mild. Aortic Valve: The aortic valve is normal in structure. Aortic valve regurgitation is not visualized. The aortic valve is structurally normal, with no evidence of sclerosis or stenosis. Aortic valve mean gradient measures 3.0 mmHg. Aortic valve peak gradient measures 5.6 mmHg. Aortic valve area, by VTI  measures 2.28 cm. Pulmonic Valve: The pulmonic valve was normal in structure. Pulmonic valve regurgitation is not visualized. Pulmonic regurgitation is not visualized. Aorta: The aortic root, ascending aorta and aortic arch are all structurally normal, with no evidence of dilitation or obstruction. Venous: The inferior vena cava is normal in size with greater than 50% respiratory variability, suggesting right atrial pressure of 3  mmHg. IAS/Shunts: No atrial level shunt detected by color flow Doppler. There is no evidence of a patent foramen ovale. No ventricular septal defect is seen or detected. There is no evidence of an atrial septal defect.  LEFT VENTRICLE PLAX 2D LVIDd:         6.07 cm       Diastology LVIDs:         5.35 cm       LV e' lateral:   4.79 cm/s LV PW:         1.14 cm       LV E/e' lateral: 21.5 LV IVS:        1.33 cm       LV e' medial:    6.74 cm/s LVOT diam:     2.10 cm       LV E/e' medial:  15.3 LV SV:         47 ml LV SV Index:   20.95 LVOT Area:     3.46 cm  LV Volumes (MOD) LV area d, A2C:    48.40 cm LV area d, A4C:    42.10 cm LV area s, A2C:    38.10 cm LV area s, A4C:    33.80 cm LV major d, A2C:   8.92 cm LV major d, A4C:   8.15 cm LV major s, A2C:   8.19 cm LV major s, A4C:   7.35 cm LV vol d, MOD A2C: 217.0 ml LV vol d, MOD A4C: 176.0 ml LV vol s, MOD A2C: 149.0 ml LV vol s, MOD A4C: 127.0 ml LV SV MOD A2C:     68.0 ml LV SV MOD A4C:     176.0 ml LV SV MOD BP:      59.2 ml RIGHT VENTRICLE RV Mid diam:    3.50 cm RV S prime:     13.40 cm/s TAPSE (M-mode): 1.8 cm LEFT ATRIUM              Index       RIGHT ATRIUM           Index LA diam:        5.20 cm  2.39 cm/m  RA Area:     17.10 cm LA Vol (A2C):   109.0 ml 50.06 ml/m RA Volume:   49.50 ml  22.73 ml/m LA Vol (A4C):   93.8 ml  43.08 ml/m LA Biplane Vol: 105.0 ml 48.22 ml/m  AORTIC VALVE                   PULMONIC VALVE AV Area (Vmax):    2.11 cm    PV Vmax:        0.83 m/s AV Area (Vmean):   2.27 cm    PV Peak grad:   2.7  mmHg AV Area (VTI):     2.28 cm    RVOT Peak grad: 1 mmHg AV Vmax:           118.00 cm/s AV Vmean:          80.400 cm/s AV VTI:            0.158 m AV Peak Grad:      5.6 mmHg AV Mean Grad:      3.0 mmHg LVOT Vmax:         71.80 cm/s LVOT Vmean:        52.700 cm/s LVOT VTI:          0.104  m LVOT/AV VTI ratio: 0.66  AORTA Ao Root diam: 3.40 cm MITRAL VALVE                        TRICUSPID VALVE MV Area (PHT): 3.89 cm             TR Peak grad:   43.3 mmHg MV PHT:        56.55 msec           TR Vmax:        332.00 cm/s MV Decel Time: 195 msec MV E velocity: 103.00 cm/s 103 cm/s SHUNTS                                     Systemic VTI:  0.10 m                                     Systemic Diam: 2.10 cm  Julien Nordmann MD Electronically signed by Julien Nordmann MD Signature Date/Time: 03/01/2019/1:08:54 PM    Final     CODE STATUS:     Code Status Orders  (From admission, onward)         Start     Ordered   02/28/19 1454  Full code  Continuous     02/28/19 1457        Code Status History    This patient has a current code status but no historical code status.   Advance Care Planning Activity      TOTAL TIME TAKING CARE OF THIS PATIENT: 40 minutes, trying to convince the patient's wife to bring him back to the hospital in speaking with nursing staff to figure out what happened.Alford Highland M.D on 03/02/2019 at 5:12 PM  Between 7am to 6pm - Pager - [phone]  After 6pm go to [email or web address] - password [email or web address]  [facility]  CC: Primary care physician; Patient, No Pcp Per

## 2019-03-02 NOTE — Progress Notes (Signed)
Pt agitated and combated. Pt is upset about amount of pain medication. RN began to message MD about pain management options for the pt. Pt began to get out of bed and removed all of his leads and PIVs from his arm. RN explained the importance of receiving care based on his condition. Patient was extremely aggressive and combative, throwing things in the room and using foul language. RN called other RN and MD to beside, Security was called and wife called to the bedside. AMA papers were signed and placed in the chart.

## 2019-03-02 NOTE — Progress Notes (Signed)
Patient ID: Brent [name], male   DOB: Apr 04, 1960, 59 y.o.   MRN: [id number] [facility] PROGRESS NOTE  Brent [name] YYQ:[id number] DOB: 03/20/1960 DOA: 02/28/2019 PCP: Patient, No Pcp Per  HPI/Subjective: Patient feeling a little bit better.  Still with some shortness of breath.  Feeling anxious about what is going on.  No chest pain.  No palpitations  Objective: Vitals:   03/02/19 0700 03/02/19 0800  BP: 110/87 112/82  Pulse: 71 71  Resp: 15 17  Temp:  97.8 F (36.6 C)  SpO2: 95% 98%    Intake/Output Summary (Last 24 hours) at 03/02/2019 1413 Last data filed at 03/02/2019 0800 Gross per 24 hour  Intake 557.86 ml  Output 1700 ml  Net -1142.14 ml   Filed Weights   02/28/19 1547 03/01/19 0417 03/02/19 0117  Weight: 96 kg 95.5 kg 97.9 kg    ROS: Review of Systems  Constitutional: Negative for chills and fever.  Eyes: Negative for blurred vision.  Respiratory: Positive for shortness of breath. Negative for cough.   Cardiovascular: Negative for chest pain.  Gastrointestinal: Negative for abdominal pain, constipation, diarrhea, nausea and vomiting.  Genitourinary: Negative for dysuria.  Musculoskeletal: Negative for joint pain.  Neurological: Negative for dizziness and headaches.   Exam: Physical Exam  Constitutional: He is oriented to person, place, and time.  HENT:  Nose: No mucosal edema.  Mouth/Throat: No oropharyngeal exudate or posterior oropharyngeal edema.  Eyes: Conjunctivae and lids are normal.  Neck: Carotid bruit is not present.  Cardiovascular: S1 normal and S2 normal. Exam reveals no gallop.  No murmur heard. Pulses:      Dorsalis pedis pulses are 2+ on the right side and 2+ on the left side.  Respiratory: No respiratory distress. He has decreased breath sounds in the right lower field and the left lower field. He has wheezes in the right middle field and the left middle field. He has no rhonchi. He has rales in the right lower field and the left lower  field.  GI: Soft. Bowel sounds are normal. There is no abdominal tenderness.  Musculoskeletal:     Right ankle: No swelling.     Left ankle: No swelling.  Lymphadenopathy:    He has no cervical adenopathy.  Neurological: He is alert and oriented to person, place, and time. No cranial nerve deficit.  Skin: Skin is warm. No rash noted. Nails show no clubbing.  Psychiatric: He has a normal mood and affect.      Data Reviewed: Basic Metabolic Panel: Recent Labs  Lab 02/28/19 1026 03/01/19 0802 03/02/19 0725  NA 135 135 136  K 3.6 3.8 4.0  CL 97* 99 99  CO2 24 24 28   GLUCOSE 176* 102* 120*  BUN 36* 40* 31*  CREATININE 0.97 1.06 0.73  CALCIUM 8.4* 8.0* 7.7*  MG 2.0  --   --    Liver Function Tests: Recent Labs  Lab 02/28/19 1026  AST 96*  ALT 172*  ALKPHOS 76  BILITOT 1.6*  PROT 7.0  ALBUMIN 3.8   CBC: Recent Labs  Lab 02/28/19 1026 03/01/19 0443 03/02/19 0725  WBC 14.4* 12.5* 11.3*  HGB 13.9 13.7 11.9*  HCT 42.0 41.4 35.5*  MCV 85.9 86.4 87.0  PLT 260 212 155   BNP (last 3 results) Recent Labs    02/28/19 1026  BNP 1,216.0*    CBG: Recent Labs  Lab 02/28/19 1041 02/28/19 1550 03/01/19 0723 03/01/19 1117  GLUCAP 154* 96 100* 125*    Recent  Results (from the past 240 hour(s))  Respiratory Panel by RT PCR (Flu A&B, Covid) - Nasopharyngeal Swab     Status: None   Collection Time: 02/28/19 12:53 PM   Specimen: Nasopharyngeal Swab  Result Value Ref Range Status   SARS Coronavirus 2 by RT PCR NEGATIVE NEGATIVE Final    Comment: (NOTE) SARS-CoV-2 target nucleic acids are NOT DETECTED. The SARS-CoV-2 RNA is generally detectable in upper respiratoy specimens during the acute phase of infection. The lowest concentration of SARS-CoV-2 viral copies this assay can detect is 131 copies/mL. A negative result does not preclude SARS-Cov-2 infection and should not be used as the sole basis for treatment or other patient management decisions. A negative  result may occur with  improper specimen collection/handling, submission of specimen other than nasopharyngeal swab, presence of viral mutation(s) within the areas targeted by this assay, and inadequate number of viral copies (<131 copies/mL). A negative result must be combined with clinical observations, patient history, and epidemiological information. The expected result is Negative. Fact Sheet for Patients:  https://www.moore.com/ Fact Sheet for Healthcare Providers:  https://www.young.biz/ This test is not yet ap proved or cleared by the [place] FDA and  has been authorized for detection and/or diagnosis of SARS-CoV-2 by FDA under an Emergency Use Authorization (EUA). This EUA will remain  in effect (meaning this test can be used) for the duration of the COVID-19 declaration under Section 564(b)(1) of the Act, 21 U.S.C. section 360bbb-3(b)(1), unless the authorization is terminated or revoked sooner.    Influenza A by PCR NEGATIVE NEGATIVE Final   Influenza B by PCR NEGATIVE NEGATIVE Final    Comment: (NOTE) The Xpert Xpress SARS-CoV-2/FLU/RSV assay is intended as an aid in  the diagnosis of influenza from Nasopharyngeal swab specimens and  should not be used as a sole basis for treatment. Nasal washings and  aspirates are unacceptable for Xpert Xpress SARS-CoV-2/FLU/RSV  testing. Fact Sheet for Patients: https://www.moore.com/ Fact Sheet for Healthcare Providers: https://www.young.biz/ This test is not yet approved or cleared by the [place] FDA and  has been authorized for detection and/or diagnosis of SARS-CoV-2 by  FDA under an Emergency Use Authorization (EUA). This EUA will remain  in effect (meaning this test can be used) for the duration of the  Covid-19 declaration under Section 564(b)(1) of the Act, 21  U.S.C. section 360bbb-3(b)(1), unless the authorization is  terminated or  revoked. Performed at [facility], [address] [place], [place] [address]   MRSA PCR Screening     Status: None   Collection Time: 02/28/19  3:56 PM   Specimen: Nasopharyngeal  Result Value Ref Range Status   MRSA by PCR NEGATIVE NEGATIVE Final    Comment:        The GeneXpert MRSA Assay (FDA approved for NASAL specimens only), is one component of a comprehensive MRSA colonization surveillance program. It is not intended to diagnose MRSA infection nor to guide or monitor treatment for MRSA infections. Performed at [facility], [address] [place], [place] [address]      Studies: CT ANGIO CHEST PE W OR WO CONTRAST  Result Date: 02/28/2019 CLINICAL DATA:  Respiratory failure EXAM: CT ANGIOGRAPHY CHEST WITH CONTRAST TECHNIQUE: Multidetector CT imaging of the chest was performed using the standard protocol during bolus administration of intravenous contrast. Multiplanar CT image reconstructions and MIPs were obtained to evaluate the vascular anatomy. CONTRAST:  81mL OMNIPAQUE IOHEXOL 350 MG/ML SOLN COMPARISON:  None. FINDINGS: Cardiovascular: Contrast injection is sufficient  to demonstrate satisfactory opacification of the pulmonary arteries to the segmental level. There is no pulmonary embolus. The main pulmonary artery is within normal limits for size. There is no CT evidence of acute right heart strain. Atherosclerotic changes are noted of the thoracic aorta. Heart size is enlarged. Coronary artery calcifications are noted. There is no significant pericardial effusion. There is reflux of contrast into the IVC consistent with some degree of cardiac dysfunction. Mediastinum/Nodes: --enlarged mediastinal and hilar lymph nodes are noted. --No axillary lymphadenopathy. --No supraclavicular lymphadenopathy. --Normal thyroid gland. --The esophagus is unremarkable Lungs/Pleura: Emphysematous changes are noted bilaterally. There is interlobular septal thickening and  scattered primarily perihilar ground-glass airspace opacities. Small bilateral pleural effusions are noted, right greater than left. The trachea is unremarkable. Upper Abdomen: There is a questionable nodular surface of the liver. There is a small amount of ascites in the upper abdomen. Musculoskeletal: No chest wall abnormality. No acute or significant osseous findings. Review of the MIP images confirms the above findings. IMPRESSION: 1. No acute pulmonary embolism. 2. Cardiomegaly with small bilateral pleural effusions and findings suggestive of pulmonary edema. A superimposed atypical infectious process is difficult to exclude. There is reflux of contrast in the IVC consistent with underlying cardiac dysfunction. 3. Probable reactive mediastinal and hilar adenopathy. 4. Small volume ascites in the upper abdomen. 5. Questionable nodular surface of the liver which may indicate underlying cirrhosis. Aortic Atherosclerosis (ICD10-I70.0) and Emphysema (ICD10-J43.9). Electronically Signed   By: Constance Holster M.D.   On: 02/28/2019 17:30   ECHOCARDIOGRAM COMPLETE  Result Date: 03/01/2019   ECHOCARDIOGRAM REPORT   Patient Name:   AVROHOM MCKELVIN Date of Exam: 03/01/2019 Medical Rec #:  [id number]  Height:       72.0 in Accession #:    [id number] Weight:       210.5 lb Date of Birth:  July 07, 1960 BSA:          2.18 m Patient Age:    89 years   BP:           123/90 mmHg Patient Gender: M          HR:           78 bpm. Exam Location:  [facility] Procedure: 2D Echo, Cardiac Doppler and Color Doppler Indications:     Abnormal ECG 794.31  History:         Patient has no prior history of Echocardiogram examinations.                  COPD; Risk Factors:Hypertension.  Sonographer:     Alyse Low Roar Referring Phys:  [place] Diagnosing Phys: Ida Rogue MD IMPRESSIONS  1. Left ventricular ejection fraction, by visual estimation, is 25 to 30%. The left ventricle has severely decreased function. There is mildly increased  left ventricular hypertrophy.  2. Mildly dilated left ventricular internal cavity size.  3. The left ventricle demonstrates global hypokinesis.  4. Global right ventricle has low normal systolic function.The right ventricular size is normal. No increase in right ventricular wall thickness.  5. Left atrial size was moderately dilated.  6. The pulmonic valve was normal in structure. Pulmonic valve regurgitation is not visualized.  7. Moderately elevated pulmonary artery systolic pressure.  8. The tricuspid regurgitant velocity is 3.29 m/s, and with an assumed right atrial pressure of 10 mmHg, the estimated right ventricular systolic pressure is moderately elevated at 53.3 mmHg. FINDINGS  Left Ventricle: Left ventricular ejection fraction, by visual estimation, is 25 to  30%. The left ventricle has severely decreased function. The left ventricle demonstrates global hypokinesis. The left ventricular internal cavity size was mildly dilated left ventricle. There is mildly increased left ventricular hypertrophy. Normal left atrial pressure. Right Ventricle: The right ventricular size is normal. No increase in right ventricular wall thickness. Global RV systolic function is has low normal systolic function. The tricuspid regurgitant velocity is 3.29 m/s, and with an assumed right atrial pressure of 10 mmHg, the estimated right ventricular systolic pressure is moderately elevated at 53.3 mmHg. Left Atrium: Left atrial size was moderately dilated. Right Atrium: Right atrial size was normal in size Pericardium: There is no evidence of pericardial effusion. Mitral Valve: The mitral valve is normal in structure. No evidence of mitral valve regurgitation. Moderate mitral valve stenosis by observation. Tricuspid Valve: The tricuspid valve is normal in structure. Tricuspid valve regurgitation is mild. Aortic Valve: The aortic valve is normal in structure. Aortic valve regurgitation is not visualized. The aortic valve is structurally  normal, with no evidence of sclerosis or stenosis. Aortic valve mean gradient measures 3.0 mmHg. Aortic valve peak gradient measures 5.6 mmHg. Aortic valve area, by VTI measures 2.28 cm. Pulmonic Valve: The pulmonic valve was normal in structure. Pulmonic valve regurgitation is not visualized. Pulmonic regurgitation is not visualized. Aorta: The aortic root, ascending aorta and aortic arch are all structurally normal, with no evidence of dilitation or obstruction. Venous: The inferior vena cava is normal in size with greater than 50% respiratory variability, suggesting right atrial pressure of 3 mmHg. IAS/Shunts: No atrial level shunt detected by color flow Doppler. There is no evidence of a patent foramen ovale. No ventricular septal defect is seen or detected. There is no evidence of an atrial septal defect.  LEFT VENTRICLE PLAX 2D LVIDd:         6.07 cm       Diastology LVIDs:         5.35 cm       LV e' lateral:   4.79 cm/s LV PW:         1.14 cm       LV E/e' lateral: 21.5 LV IVS:        1.33 cm       LV e' medial:    6.74 cm/s LVOT diam:     2.10 cm       LV E/e' medial:  15.3 LV SV:         47 ml LV SV Index:   20.95 LVOT Area:     3.46 cm  LV Volumes (MOD) LV area d, A2C:    48.40 cm LV area d, A4C:    42.10 cm LV area s, A2C:    38.10 cm LV area s, A4C:    33.80 cm LV major d, A2C:   8.92 cm LV major d, A4C:   8.15 cm LV major s, A2C:   8.19 cm LV major s, A4C:   7.35 cm LV vol d, MOD A2C: 217.0 ml LV vol d, MOD A4C: 176.0 ml LV vol s, MOD A2C: 149.0 ml LV vol s, MOD A4C: 127.0 ml LV SV MOD A2C:     68.0 ml LV SV MOD A4C:     176.0 ml LV SV MOD BP:      59.2 ml RIGHT VENTRICLE RV Mid diam:    3.50 cm RV S prime:     13.40 cm/s TAPSE (M-mode): 1.8 cm LEFT ATRIUM  Index       RIGHT ATRIUM           Index LA diam:        5.20 cm  2.39 cm/m  RA Area:     17.10 cm LA Vol (A2C):   109.0 ml 50.06 ml/m RA Volume:   49.50 ml  22.73 ml/m LA Vol (A4C):   93.8 ml  43.08 ml/m LA Biplane Vol: 105.0  ml 48.22 ml/m  AORTIC VALVE                   PULMONIC VALVE AV Area (Vmax):    2.11 cm    PV Vmax:        0.83 m/s AV Area (Vmean):   2.27 cm    PV Peak grad:   2.7 mmHg AV Area (VTI):     2.28 cm    RVOT Peak grad: 1 mmHg AV Vmax:           118.00 cm/s AV Vmean:          80.400 cm/s AV VTI:            0.158 m AV Peak Grad:      5.6 mmHg AV Mean Grad:      3.0 mmHg LVOT Vmax:         71.80 cm/s LVOT Vmean:        52.700 cm/s LVOT VTI:          0.104 m LVOT/AV VTI ratio: 0.66  AORTA Ao Root diam: 3.40 cm MITRAL VALVE                        TRICUSPID VALVE MV Area (PHT): 3.89 cm             TR Peak grad:   43.3 mmHg MV PHT:        56.55 msec           TR Vmax:        332.00 cm/s MV Decel Time: 195 msec MV E velocity: 103.00 cm/s 103 cm/s SHUNTS                                     Systemic VTI:  0.10 m                                     Systemic Diam: 2.10 cm  Julien Nordmann MD Electronically signed by Julien Nordmann MD Signature Date/Time: 03/01/2019/1:08:54 PM    Final     Scheduled Meds: . amiodarone  400 mg Oral BID  . aspirin EC  81 mg Oral Daily  . atorvastatin  80 mg Oral q1800  . Chlorhexidine Gluconate Cloth  6 each Topical Daily  . furosemide  40 mg Intravenous BID  . ipratropium-albuterol  3 mL Nebulization Q6H  . methylPREDNISolone (SOLU-MEDROL) injection  40 mg Intravenous Daily  . metoprolol succinate  12.5 mg Oral QHS  . nicotine  21 mg Transdermal Daily  . potassium chloride  20 mEq Oral BID   Continuous Infusions: . heparin 2,100 Units/hr (03/02/19 0106)    Assessment/Plan:  1. Ventricular tachycardia with cardiomyopathy.  Switch amiodarone over to oral.  Candidate for LifeVest.  Possible candidate for defibrillator in the future. 2. Chest pain with elevated troponin which could  be demand ischemia versus NSTEMI.  Cardiology ordered a stress test and results are pending currently.  Patient on aspirin and heparin drip.  If stress test negative I will get rid of the heparin  drip. 3. Acute hypoxic respiratory failure.  Patient currently on high flow nasal cannula 10 L this morning.  Since oxygen saturations are good hopefully can taper down on the high flow nasal cannula.  Started Lasix to get rid of fluid. 4. Acute systolic congestive heart failure.  EF 25 to 30%.  Continue IV Lasix to get rid of fluid.  Continue low-dose metoprolol.  Transitional care team referral about LifeVest. 5. COPD exacerbation start steroids and nebulizers. 6. Hyperlipidemia started on Lipitor.  LDL 119. 7. Cirrhosis of liver, elevated liver function tests.  Hepatitis B and C both positive.  Send off hepatitis C viral load and genotype.  Explained this to the patient and wife.  Advised the wife to also get tested for hepatitis. 8. Tobacco abuse on nicotine patch  Code Status:     Code Status Orders  (From admission, onward)         Start     Ordered   02/28/19 1454  Full code  Continuous     02/28/19 1457        Code Status History    This patient has a current code status but no historical code status.   Advance Care Planning Activity     Family Communication: Spoke with patient's wife at the bedside Disposition Plan: Patient will need to be off high flow nasal cannula prior to any disposition.  Likely will need a few more days here in the hospital  Consultants:  Cardiology  Time spent: 28 minutes  Mishayla Sliwinski [employer]

## 2019-03-02 NOTE — ED Triage Notes (Signed)
Patient arrives to ED via EMS with complaint of chest pain that started at approx 1900.  Patient states he left ICU [facility] Saturday. Pain started today in substernal area rated 7/10, described as pressure. Denies SOB, LOC, or sweating.

## 2019-03-02 NOTE — Consult Note (Signed)
ANTICOAGULATION CONSULT NOTE - Initial Consult  Pharmacy Consult for Heparin Infusion  Indication: chest pain/ACS  No Known Allergies  Patient Measurements: Height: 6' (182.9 cm) Weight: 210 lb 8.6 oz (95.5 kg) IBW/kg (Calculated) : 77.6  Vital Signs: Temp: 98.7 F (37.1 C) (01/24 2000) Temp Source: Oral (01/24 2000) BP: 96/68 (01/24 2200) Pulse Rate: 83 (01/24 2200)  Labs: Recent Labs    02/28/19 1026 02/28/19 1241 02/28/19 1817 03/01/19 0002 03/01/19 0443 03/01/19 0802 03/01/19 1504 03/02/19 0017  HGB 13.9  --   --   --  13.7  --   --   --   HCT 42.0  --   --   --  41.4  --   --   --   PLT 260  --   --   --  212  --   --   --   APTT 29  --   --   --   --   --   --   --   LABPROT 15.1  --   --   --   --   --   --   --   INR 1.2  --   --   --   --   --   --   --   HEPARINUNFRC  --   --    < >   < >  --  <0.10* 0.15* 0.29*  CREATININE 0.97  --   --   --   --  1.06  --   --   TROPONINIHS 63* 62*  --   --   --   --   --   --    < > = values in this interval not displayed.    Estimated Creatinine Clearance: 92.2 mL/min (by C-G formula based on SCr of 1.06 mg/dL).   Medical History: Past Medical History:  Diagnosis Date  . COPD (chronic obstructive pulmonary disease) (HCC)   . Hypertension    Assessment: Pharmacy consulted for heparin infusion dosing and monitoring for ACS/STEMI. On EMS arrival patient was found to in V tach. Amiodarone started.    1/23:  HL @ 1817 = 0.38 Will continue pt on current rate .  1/24 @ 0004 HL < 0.1, confirmed w/ RN no problems w/ infusion.  Will rebolus w/ 2000 units x 1 then increase infusion to 1300 units/hr.   1/24 HL @ 0802 < 0.1 confirmed w/ RN no problems w/ infusion. Will rebolus w/ 2000 units x 1 then increase infusion to 1500 units/hr.   Goal of Therapy:  Heparin level 0.3-0.7 units/ml Monitor platelets by anticoagulation protocol: Yes   Plan:  Heparin level subtherapeutic. Will give a 2000 unit bolus and increase the  rate to 1500 units/hr. Daily CBC while on heparin. Will order a 6 hour heparin level.   1/24: HL @ 1500 = 0.15 Will order Heparin 3000 units IV X 1 bolus and increase drip rate to 1900 units/hr.  Will recheck HL 6 hrs after rate change.  1/25 @ 0017 HL = 0.29, subtherapeutic just barely. D/W RN -  Will increase Heparin infusion to 2100 units/hr and recheck HL in 6 hours  Wayland Denis, PharmD Clinical Pharmacist 03/02/2019 12:56 AM

## 2019-03-02 NOTE — Progress Notes (Signed)
Patient ID: Brent [name], male   DOB: [birth year], 59 y.o.   MRN: [id number]  I was notified after the patient left AMA.  I called the patient's wife on the phone and asked if he will come back to the hospital because there is a high risk of death.  The patient was hypoxic on high flow nasal cannula and had ventricular tachycardia in the left very quickly without me having the opportunity to convince him not to leave.  The patient's wife states he will come back.  I left a message for the nursing supervisor.  I called the nurse back in the ICU he will have to go through the ED since he left AMA.  Dr Alford Highland

## 2019-03-02 NOTE — ED Provider Notes (Addendum)
[facility] Emergency Department Provider Note  ____________________________________________   First MD Initiated Contact with Patient 03/02/19 2340     (approximate)  I have reviewed the triage vital signs and the nursing notes.   HISTORY  Chief Complaint Chest Pain    HPI Brent [name] is a 59 y.o. male with below list of previous medical conditions including recent ventricular tachycardia requiring electrical conversion returns to the emergency department via EMS after leaving the intensive care unit today at 4:00 PM.  Patient states that his pain reoccurred at 6 PM.  Patient states that pain is located in the center of the chest without radiation.  Patient states that current pain score is 7 out of 10.  Patient denies any dyspnea no nausea or vomiting.  Patient denies any diaphoresis or dizziness.  EMS did administer 3 and 24 mg of aspirin before arrival to emergency department.  Chart review revealed that the patient's echocardiogram revealed global hypokinesis with an EF of 25 to 30%.        Past Medical History:  Diagnosis Date  . COPD (chronic obstructive pulmonary disease) (HCC)   . Hypertension     Patient Active Problem List   Diagnosis Date Noted  . Acute systolic CHF (congestive heart failure) (HCC)   . COPD with acute exacerbation (HCC)   . Other cirrhosis of liver (HCC)   . Chronic hepatitis C without hepatic coma (HCC)   . Hep B w/o coma   . Acute respiratory failure with hypoxia (HCC)   . Acute congestive heart failure (HCC)   . Hyperlipidemia   . Ventricular tachycardia (HCC) 02/28/2019  . Tobacco abuse counseling 02/28/2019  . SOB (shortness of breath) 02/28/2019  . NSTEMI (non-ST elevated myocardial infarction) (HCC) 02/28/2019  . Transaminitis 02/28/2019    No past surgical history on file.  Prior to Admission medications   Medication Sig Start Date End Date Taking? Authorizing Provider  cyclobenzaprine (FLEXERIL) 10 MG  tablet Take 10 mg by mouth 3 (three) times daily as needed for muscle spasms.    [provider]  gabapentin (NEURONTIN) 300 MG capsule Take 300 mg by mouth 3 (three) times daily as needed (pain).     [provider]  Multiple Vitamin (MULTIVITAMIN WITH MINERALS) TABS tablet Take 1 tablet by mouth daily.    [provider]  naproxen sodium (ALEVE) 220 MG tablet Take 220-440 mg by mouth 2 (two) times daily as needed (pain).    [provider]  oxyCODONE (ROXICODONE) 15 MG immediate release tablet Take 15 mg by mouth See admin instructions. Take 1 tablet (15mg ) by mouth three to four times daily 02/11/19   [provider]    Allergies Patient has no known allergies.  No family history on file.  Social History Social History   Tobacco Use  . Smoking status: Current Every Day Smoker  Substance Use Topics  . Alcohol use: Not Currently  . Drug use: Not Currently    Review of Systems Constitutional: No fever/chills Eyes: No visual changes. ENT: No sore throat. Cardiovascular: Positive for chest pain. Respiratory: Denies shortness of breath. Gastrointestinal: No abdominal pain.  No nausea, no vomiting.  No diarrhea.  No constipation. Genitourinary: Negative for dysuria. Musculoskeletal: Negative for neck pain.  Negative for back pain. Integumentary: Negative for rash. Neurological: Negative for headaches, focal weakness or numbness.  ____________________________________________   PHYSICAL EXAM:  VITAL SIGNS: ED Triage Vitals  Enc Vitals Group     BP  Pulse      Resp      Temp      Temp src      SpO2      Weight      Height      Head Circumference      Peak Flow      Pain Score      Pain Loc      Pain Edu?      Excl. in GC?     Constitutional: Alert and oriented.  Eyes: Conjunctivae are normal.  Head: Atraumatic. Nose: No congestion/rhinnorhea. Mouth/Throat: Patient is wearing a mask. Neck: No stridor.  No meningeal  signs.   Cardiovascular: Normal rate, regular rhythm. Good peripheral circulation. Grossly normal heart sounds. Respiratory: Normal respiratory effort.  No retractions. Gastrointestinal: Soft and nontender. No distention.  Musculoskeletal: No lower extremity tenderness nor edema. No gross deformities of extremities. Neurologic:  Normal speech and language. No gross focal neurologic deficits are appreciated.  Skin:  Skin is warm, dry and intact. Psychiatric: Mood and affect are normal. Speech and behavior are normal.  ____________________________________________   LABS (all labs ordered are listed, but only abnormal results are displayed)  Labs Reviewed  BASIC METABOLIC PANEL  CBC  TROPONIN I (HIGH SENSITIVITY)   ____________________________________________  EKG  ED ECG REPORT I, Stryker N Jackson Fetters, the attending physician, personally viewed and interpreted this ECG.   Date: 03/02/2019  EKG Time11:38 PM  Rate: 85  Rhythm: Normal sinus rhythm with premature ventricular contractions  Axis: Normal  Intervals: Irregular RR interval secondary to PVCs  ST&T Change: None ____________________________________  RADIOLOGY I, Courtland N Darell Saputo, personally viewed and evaluated these images (plain radiographs) as part of my medical decision making, as well as reviewing the written report by the radiologist.    Official radiology report(s): NM Myocar Multi W/Spect W/Wall Motion / EF  Result Date: 03/02/2019  Blood pressure demonstrated a normal response to exercise.  There was no ST segment deviation noted during stress.  Defect 1: There is a medium defect of moderate severity present in the basal inferior, basal inferolateral, mid inferior and mid inferolateral location.  Findings consistent with prior myocardial infarction with peri-infarct ischemia.  This is an intermediate risk study.  The left ventricular ejection fraction is moderately decreased (30-44%).      ____________________________________________   PROCEDURES   .Critical Care Performed by: Darci Current, MD Authorized by: Darci Current, MD   Critical care provider statement:    Critical care time (minutes):  30   Critical care time was exclusive of:  Separately billable procedures and treating other patients   Critical care was necessary to treat or prevent imminent or life-threatening deterioration of the following conditions:  Cardiac failure and circulatory failure   Critical care was time spent personally by me on the following activities:  Development of treatment plan with patient or surrogate, discussions with consultants, evaluation of patient's response to treatment, examination of patient, obtaining history from patient or surrogate, ordering and performing treatments and interventions, ordering and review of laboratory studies, ordering and review of radiographic studies, pulse oximetry, re-evaluation of patient's condition and review of old charts     ____________________________________________   INITIAL IMPRESSION / MDM / ASSESSMENT AND PLAN / ED COURSE  As part of my medical decision making, I reviewed the following data within the electronic MEDICAL RECORD NUMBER   59 year old male presented with above-stated history and physical exam a differential diagnosis including but not limited to  arrhythmia including V. tach/V. fib given patient's recent admission for the same.  Also concern for possible CAD/MI.  EKG revealed no evidence of ischemia or infarction.  EKG did reveal PVCs and while in the emergency department patient had a 9 beat run of stable ventricular tachycardia after he was admitted.  Hospital staff Dr. Damita Dunnings was indeed notified.       ____________________________________________  FINAL CLINICAL IMPRESSION(S) / ED DIAGNOSES  Final diagnoses:  Chest pain, unspecified type  Ventricular tachycardia ([place])     MEDICATIONS GIVEN DURING THIS  VISIT:  Medications - No data to display   ED Discharge Orders    None      *Please note:  Melroy Bougher was evaluated in Emergency Department on 03/02/2019 for the symptoms described in the history of present illness. He was evaluated in the context of the global COVID-19 pandemic, which necessitated consideration that the patient might be at risk for infection with the SARS-CoV-2 virus that causes COVID-19. Institutional protocols and algorithms that pertain to the evaluation of patients at risk for COVID-19 are in a state of rapid change based on information released by regulatory bodies including the CDC and federal and state organizations. These policies and algorithms were followed during the patient's care in the ED.  Some ED evaluations and interventions may be delayed as a result of limited staffing during the pandemic.*  Note:  This document was prepared using Dragon voice recognition software and may include unintentional dictation errors.   Gregor Hams, MD 03/03/19 0881    Gregor Hams, MD 03/13/19 [phone]

## 2019-03-03 ENCOUNTER — Encounter: Payer: Self-pay | Admitting: Family Medicine

## 2019-03-03 DIAGNOSIS — B182 Chronic viral hepatitis C: Secondary | ICD-10-CM | POA: Diagnosis present

## 2019-03-03 DIAGNOSIS — G8929 Other chronic pain: Secondary | ICD-10-CM | POA: Diagnosis present

## 2019-03-03 DIAGNOSIS — I5023 Acute on chronic systolic (congestive) heart failure: Secondary | ICD-10-CM | POA: Diagnosis present

## 2019-03-03 DIAGNOSIS — I252 Old myocardial infarction: Secondary | ICD-10-CM | POA: Diagnosis not present

## 2019-03-03 DIAGNOSIS — I509 Heart failure, unspecified: Secondary | ICD-10-CM

## 2019-03-03 DIAGNOSIS — R0902 Hypoxemia: Secondary | ICD-10-CM | POA: Diagnosis present

## 2019-03-03 DIAGNOSIS — E785 Hyperlipidemia, unspecified: Secondary | ICD-10-CM | POA: Diagnosis present

## 2019-03-03 DIAGNOSIS — Z20822 Contact with and (suspected) exposure to covid-19: Secondary | ICD-10-CM | POA: Diagnosis present

## 2019-03-03 DIAGNOSIS — I2510 Atherosclerotic heart disease of native coronary artery without angina pectoris: Secondary | ICD-10-CM

## 2019-03-03 DIAGNOSIS — R079 Chest pain, unspecified: Secondary | ICD-10-CM

## 2019-03-03 DIAGNOSIS — I472 Ventricular tachycardia: Secondary | ICD-10-CM | POA: Diagnosis present

## 2019-03-03 DIAGNOSIS — F17210 Nicotine dependence, cigarettes, uncomplicated: Secondary | ICD-10-CM | POA: Diagnosis present

## 2019-03-03 DIAGNOSIS — I214 Non-ST elevation (NSTEMI) myocardial infarction: Secondary | ICD-10-CM | POA: Diagnosis present

## 2019-03-03 DIAGNOSIS — Z79899 Other long term (current) drug therapy: Secondary | ICD-10-CM | POA: Diagnosis not present

## 2019-03-03 DIAGNOSIS — Z8679 Personal history of other diseases of the circulatory system: Secondary | ICD-10-CM

## 2019-03-03 DIAGNOSIS — I110 Hypertensive heart disease with heart failure: Secondary | ICD-10-CM | POA: Diagnosis present

## 2019-03-03 DIAGNOSIS — Z79891 Long term (current) use of opiate analgesic: Secondary | ICD-10-CM | POA: Diagnosis not present

## 2019-03-03 DIAGNOSIS — I5022 Chronic systolic (congestive) heart failure: Secondary | ICD-10-CM

## 2019-03-03 DIAGNOSIS — I248 Other forms of acute ischemic heart disease: Secondary | ICD-10-CM | POA: Diagnosis present

## 2019-03-03 DIAGNOSIS — J449 Chronic obstructive pulmonary disease, unspecified: Secondary | ICD-10-CM | POA: Diagnosis present

## 2019-03-03 LAB — BASIC METABOLIC PANEL
Anion gap: 10 (ref 5–15)
BUN: 30 mg/dL — ABNORMAL HIGH (ref 6–20)
CO2: 27 mmol/L (ref 22–32)
Calcium: 7.7 mg/dL — ABNORMAL LOW (ref 8.9–10.3)
Chloride: 98 mmol/L (ref 98–111)
Creatinine, Ser: 0.77 mg/dL (ref 0.61–1.24)
GFR calc Af Amer: >60 mL/min (ref >60)
GFR calc non Af Amer: >60 mL/min (ref >60)
Glucose, Bld: 130 mg/dL — ABNORMAL HIGH (ref 70–99)
Potassium: 3.7 mmol/L (ref 3.5–5.1)
Sodium: 135 mmol/L (ref 135–145)

## 2019-03-03 LAB — BRAIN NATRIURETIC PEPTIDE: B Natriuretic Peptide: 990 pg/mL — ABNORMAL HIGH (ref 0.0–100.0)

## 2019-03-03 LAB — SARS CORONAVIRUS 2 (TAT 6-24 HRS): SARS Coronavirus 2: NEGATIVE

## 2019-03-03 LAB — TROPONIN I (HIGH SENSITIVITY)
Troponin I (High Sensitivity): 49 ng/L — ABNORMAL HIGH (ref <18)
Troponin I (High Sensitivity): 50 ng/L — ABNORMAL HIGH (ref <18)

## 2019-03-03 LAB — POC SARS CORONAVIRUS 2 AG: SARS Coronavirus 2 Ag: NEGATIVE

## 2019-03-03 LAB — MAGNESIUM: Magnesium: 1.9 mg/dL (ref 1.7–2.4)

## 2019-03-03 LAB — HEPATITIS B SURFACE ANTIBODY, QUANTITATIVE: Hep B S AB Quant (Post): 337 m[IU]/mL (ref >9.9)

## 2019-03-03 MED ORDER — ASPIRIN EC 81 MG PO TBEC
81.0000 mg | DELAYED_RELEASE_TABLET | Freq: Every day | ORAL | Status: DC
  Administered 2019-03-04 – 2019-03-05 (×2): 81 mg via ORAL
  Filled 2019-03-03 (×2): qty 1

## 2019-03-03 MED ORDER — FUROSEMIDE 10 MG/ML IJ SOLN
40.0000 mg | Freq: Two times a day (BID) | INTRAMUSCULAR | Status: DC
  Administered 2019-03-03 – 2019-03-05 (×5): 40 mg via INTRAVENOUS
  Filled 2019-03-03 (×5): qty 4

## 2019-03-03 MED ORDER — LORAZEPAM 2 MG/ML IJ SOLN
0.5000 mg | Freq: Once | INTRAMUSCULAR | Status: AC
  Administered 2019-03-03: 05:00:00 0.5 mg via INTRAVENOUS

## 2019-03-03 MED ORDER — OXYCODONE HCL 5 MG PO TABS
15.0000 mg | ORAL_TABLET | ORAL | Status: DC | PRN
  Administered 2019-03-03 – 2019-03-05 (×12): 15 mg via ORAL
  Filled 2019-03-03 (×12): qty 3

## 2019-03-03 MED ORDER — METOPROLOL TARTRATE 25 MG PO TABS: 12.5000 mg | ORAL_TABLET | Freq: Two times a day (BID) | ORAL | Status: DC

## 2019-03-03 MED ORDER — OXYCODONE HCL 5 MG PO TABS: 15.0000 mg | ORAL_TABLET | ORAL | Status: DC | PRN

## 2019-03-03 MED ORDER — INFLUENZA VAC SPLIT QUAD 0.5 ML IM SUSY: 0.5000 mL | PREFILLED_SYRINGE | INTRAMUSCULAR | Status: DC

## 2019-03-03 MED ORDER — LISINOPRIL 5 MG PO TABS
5.0000 mg | ORAL_TABLET | Freq: Every day | ORAL | Status: DC
  Administered 2019-03-03 – 2019-03-05 (×3): 5 mg via ORAL
  Filled 2019-03-03 (×3): qty 1

## 2019-03-03 MED ORDER — ATORVASTATIN CALCIUM 10 MG PO TABS
10.0000 mg | ORAL_TABLET | Freq: Every day | ORAL | Status: DC
  Administered 2019-03-03: 17:00:00 10 mg via ORAL
  Filled 2019-03-03: qty 1

## 2019-03-03 MED ORDER — ENOXAPARIN SODIUM 40 MG/0.4ML ~~LOC~~ SOLN
40.0000 mg | SUBCUTANEOUS | Status: DC
  Administered 2019-03-03: 05:00:00 40 mg via SUBCUTANEOUS
  Filled 2019-03-03: qty 0.4

## 2019-03-03 MED ORDER — LORAZEPAM 2 MG/ML IJ SOLN
INTRAMUSCULAR | Status: AC
  Filled 2019-03-03: qty 1

## 2019-03-03 MED ORDER — AMIODARONE HCL IN DEXTROSE 360-4.14 MG/200ML-% IV SOLN
30.0000 mg/h | INTRAVENOUS | Status: DC
  Administered 2019-03-03: 05:00:00 30 mg/h via INTRAVENOUS
  Filled 2019-03-03: qty 200

## 2019-03-03 MED ORDER — NITROGLYCERIN 0.4 MG SL SUBL: 0.4000 mg | SUBLINGUAL_TABLET | SUBLINGUAL | Status: DC | PRN

## 2019-03-03 MED ORDER — ENOXAPARIN SODIUM 40 MG/0.4ML ~~LOC~~ SOLN
40.0000 mg | SUBCUTANEOUS | Status: DC
  Administered 2019-03-05: 40 mg via SUBCUTANEOUS
  Filled 2019-03-03: qty 0.4

## 2019-03-03 MED ORDER — SODIUM CHLORIDE 0.9% FLUSH
3.0000 mL | Freq: Two times a day (BID) | INTRAVENOUS | Status: DC
  Administered 2019-03-03 – 2019-03-04 (×3): 3 mL via INTRAVENOUS

## 2019-03-03 MED ORDER — ONDANSETRON HCL 4 MG/2ML IJ SOLN: 4.0000 mg | Freq: Four times a day (QID) | INTRAMUSCULAR | Status: DC | PRN

## 2019-03-03 MED ORDER — ACETAMINOPHEN 325 MG PO TABS: 650.0000 mg | ORAL_TABLET | ORAL | Status: DC | PRN

## 2019-03-03 MED ORDER — AMIODARONE HCL 200 MG PO TABS
400.0000 mg | ORAL_TABLET | Freq: Two times a day (BID) | ORAL | Status: DC
  Administered 2019-03-03 – 2019-03-05 (×5): 400 mg via ORAL
  Filled 2019-03-03 (×6): qty 2

## 2019-03-03 NOTE — ED Notes (Signed)
House transport notified. Awaiting for H.T. at this time.

## 2019-03-03 NOTE — ED Notes (Signed)
Pt denies CP at this time 

## 2019-03-03 NOTE — H&P (Addendum)
History and Physical    Brent [name] GQB:[id number] DOB: 03/05/1960 DOA: 03/02/2019  PCP: Patient, No Pcp Per   Patient coming from: home. AMA discharge earlier  I have personally briefly reviewed patient's old medical records in [facility]  Chief Complaint: chest pain  HPI: Brent [name] is a 60 y.o. male with medical history significant for COPD and hypertension, admitted on 02/28/2019 with sustained V. tach requiring electrical cardioversion, with subsequent work-up revealing systolic heart failure EF 25 to 30%, and abnormal stress test (Findings consistent with prior myocardial infarction with peri-infarct ischemia), who signed out AMA on 03/02/2019 who returns to the ER within 12 hours with a complaint of chest pain that started not long after his return home.  He took 4 baby aspirin and the pain went away for the most part, 10 total lasted up to 3 hours.  He stated that during the episode he had palpitations and it was hard to catch her breath.  He denied diaphoresis, nausea or vomiting.  ED Course: By arrival in the emergency room, he was in normal sinus rhythm with a heart rate of 79.  Pressure 108/68.  He was febrile, and slightly tachypneic at 22 but with O2 sat 95% on room air.  He admitted to minimal chest discomfort, much improved from previously.  His troponin was 49, down from 62 on his previous admission on 02/28/2019.  EKG showed T wave inversion in anterolateral leads, unchanged from 2 days prior.  Chest x-ray showed mixed interstitial and airspace disease throughout both lungs could reflect edema or atypical infection.  Review of Systems: As per HPI otherwise 10 point review of systems negative.   Past Medical History:  Diagnosis Date  . COPD (chronic obstructive pulmonary disease) (HCC)   . Hypertension     History reviewed. No pertinent surgical history.   reports that he has been smoking cigarettes. He has been smoking about 2.00 packs per day. He has never used  smokeless tobacco. He reports previous alcohol use. He reports previous drug use.  No Known Allergies  History reviewed. No pertinent family history.   Prior to Admission medications   Medication Sig Start Date End Date Taking? Authorizing Provider  cyclobenzaprine (FLEXERIL) 10 MG tablet Take 10 mg by mouth 3 (three) times daily as needed for muscle spasms.    [provider]  gabapentin (NEURONTIN) 300 MG capsule Take 300 mg by mouth 3 (three) times daily as needed (pain).     [provider]  Multiple Vitamin (MULTIVITAMIN WITH MINERALS) TABS tablet Take 1 tablet by mouth daily.    [provider]  naproxen sodium (ALEVE) 220 MG tablet Take 220-440 mg by mouth 2 (two) times daily as needed (pain).    [provider]  oxyCODONE (ROXICODONE) 15 MG immediate release tablet Take 15 mg by mouth See admin instructions. Take 1 tablet (15mg ) by mouth three to four times daily 02/11/19   [provider]    Physical Exam: Vitals:   03/02/19 2345 03/03/19 0130 03/03/19 0200 03/03/19 0202  BP:  100/72 100/75   Pulse:  78 76   Resp:  (!) 22 (!) 24   Temp:      TempSrc:      SpO2:  95% 90% 95%  Weight: 95.3 kg     Height: 6' (1.829 m)        Vitals:   03/02/19 2345 03/03/19 0130 03/03/19 0200 03/03/19 0202  BP:  100/72 100/75   Pulse:  78 76   Resp:  (!) 22 (!) 24   Temp:      TempSrc:      SpO2:  95% 90% 95%  Weight: 95.3 kg     Height: 6' (1.829 m)       Constitutional: NAD, alert and oriented x 3 Eyes: PERRL, lids and conjunctivae normal ENMT: Mucous membranes are moist.  Neck: normal, supple, no masses, no thyromegaly Respiratory: clear to auscultation bilaterally, no wheezing, no crackles. Normal respiratory effort. No accessory muscle use.  Cardiovascular: Regular rate and rhythm, no murmurs / rubs / gallops. No extremity edema. 2+ pedal pulses. No carotid bruits.  Abdomen: no tenderness, no masses palpated. No hepatosplenomegaly.  Bowel sounds positive.  Musculoskeletal: no clubbing / cyanosis. No joint deformity upper and lower extremities.  Skin: no rashes, lesions, ulcers.  Neurologic: No gross focal neurologic deficit. Psychiatric: Normal mood and affect.   Labs on Admission: I have personally reviewed following labs and imaging studies  CBC: Recent Labs  Lab 02/28/19 1026 03/01/19 0443 03/02/19 0725 03/02/19 2347  WBC 14.4* 12.5* 11.3* 10.7*  HGB 13.9 13.7 11.9* 11.8*  HCT 42.0 41.4 35.5* 35.5*  MCV 85.9 86.4 87.0 86.6  PLT 260 212 155 176   Basic Metabolic Panel: Recent Labs  Lab 02/28/19 1026 03/01/19 0802 03/02/19 0725 03/02/19 2347  NA 135 135 136 135  K 3.6 3.8 4.0 3.7  CL 97* 99 99 98  CO2 24 24 28 27   GLUCOSE 176* 102* 120* 130*  BUN 36* 40* 31* 30*  CREATININE 0.97 1.06 0.73 0.77  CALCIUM 8.4* 8.0* 7.7* 7.7*  MG 2.0  --   --   --    GFR: Estimated Creatinine Clearance: 122.1 mL/min (by C-G formula based on SCr of 0.77 mg/dL). Liver Function Tests: Recent Labs  Lab 02/28/19 1026  AST 96*  ALT 172*  ALKPHOS 76  BILITOT 1.6*  PROT 7.0  ALBUMIN 3.8   No results for input(s): LIPASE, AMYLASE in the last 168 hours. No results for input(s): AMMONIA in the last 168 hours. Coagulation Profile: Recent Labs  Lab 02/28/19 1026  INR 1.2   Cardiac Enzymes: No results for input(s): CKTOTAL, CKMB, CKMBINDEX, TROPONINI in the last 168 hours. BNP (last 3 results) No results for input(s): PROBNP in the last 8760 hours. HbA1C: Recent Labs    02/28/19 1616  HGBA1C 5.9*   CBG: Recent Labs  Lab 02/28/19 1041 02/28/19 1550 03/01/19 0723 03/01/19 1117  GLUCAP 154* 96 100* 125*   Lipid Profile: Recent Labs    03/01/19 0443  CHOL 162  HDL 22*  LDLCALC 119*  TRIG 104  CHOLHDL 7.4   Thyroid Function Tests: Recent Labs    02/28/19 1817  TSH 2.095   Anemia Panel: No results for input(s): VITAMINB12, FOLATE, FERRITIN, TIBC, IRON, RETICCTPCT in the last 72  hours. Urine analysis: No results found for: COLORURINE, APPEARANCEUR, LABSPEC, PHURINE, GLUCOSEU, HGBUR, BILIRUBINUR, KETONESUR, PROTEINUR, UROBILINOGEN, NITRITE, LEUKOCYTESUR  Radiological Exams on Admission: NM Myocar Multi W/Spect W/Wall Motion / EF  Result Date: 03/02/2019  Blood pressure demonstrated a normal response to exercise.  There was no ST segment deviation noted during stress.  Defect 1: There is a medium defect of moderate severity present in the basal inferior, basal inferolateral, mid inferior and mid inferolateral location.  Findings consistent with prior myocardial infarction with peri-infarct ischemia.  This is an intermediate risk study.  The left ventricular ejection fraction is moderately decreased (30-44%).    DG  Chest Port 1 View  Result Date: 03/02/2019 CLINICAL DATA:  Chest pain EXAM: PORTABLE CHEST 1 VIEW COMPARISON:  Radiograph and CT 02/28/2019 FINDINGS: Increasingly confluent areas of mixed airspace and reticular opacity throughout both lungs most pronounced in the left mid lung and lung bases. Small bilateral effusions are present, right slightly greater than left. No pneumothorax. The cardiomediastinal contours are stable from prior. No acute osseous or soft tissue abnormality. Degenerative changes are present in the imaged spine and shoulders. Telemetry leads overlie the chest. IMPRESSION: Increasingly confluent mixed interstitial and airspace disease throughout both lungs could reflect edema or atypical infection. Electronically Signed   By: Kreg Shropshire M.D.   On: 03/02/2019 23:54   ECHOCARDIOGRAM COMPLETE  Result Date: 03/01/2019   ECHOCARDIOGRAM REPORT   Patient Name:   Brent [name] Date of Exam: 03/01/2019 Medical Rec #:  [id number]  Height:       72.0 in Accession #:    [id number] Weight:       210.5 lb Date of Birth:  Nov 29, 1960 BSA:          2.18 m Patient Age:    58 years   BP:           123/90 mmHg Patient Gender: M          HR:           78 bpm. Exam  Location:  [facility] Procedure: 2D Echo, Cardiac Doppler and Color Doppler Indications:     Abnormal ECG 794.31  History:         Patient has no prior history of Echocardiogram examinations.                  COPD; Risk Factors:Hypertension.  Sonographer:     Neysa Bonito Roar Referring Phys:  [id number] Eliezer Mccoy PATEL Diagnosing Phys: Julien Nordmann MD IMPRESSIONS  1. Left ventricular ejection fraction, by visual estimation, is 25 to 30%. The left ventricle has severely decreased function. There is mildly increased left ventricular hypertrophy.  2. Mildly dilated left ventricular internal cavity size.  3. The left ventricle demonstrates global hypokinesis.  4. Global right ventricle has low normal systolic function.The right ventricular size is normal. No increase in right ventricular wall thickness.  5. Left atrial size was moderately dilated.  6. The pulmonic valve was normal in structure. Pulmonic valve regurgitation is not visualized.  7. Moderately elevated pulmonary artery systolic pressure.  8. The tricuspid regurgitant velocity is 3.29 m/s, and with an assumed right atrial pressure of 10 mmHg, the estimated right ventricular systolic pressure is moderately elevated at 53.3 mmHg. FINDINGS  Left Ventricle: Left ventricular ejection fraction, by visual estimation, is 25 to 30%. The left ventricle has severely decreased function. The left ventricle demonstrates global hypokinesis. The left ventricular internal cavity size was mildly dilated left ventricle. There is mildly increased left ventricular hypertrophy. Normal left atrial pressure. Right Ventricle: The right ventricular size is normal. No increase in right ventricular wall thickness. Global RV systolic function is has low normal systolic function. The tricuspid regurgitant velocity is 3.29 m/s, and with an assumed right atrial pressure of 10 mmHg, the estimated right ventricular systolic pressure is moderately elevated at 53.3 mmHg. Left Atrium: Left atrial size was  moderately dilated. Right Atrium: Right atrial size was normal in size Pericardium: There is no evidence of pericardial effusion. Mitral Valve: The mitral valve is normal in structure. No evidence of mitral valve regurgitation. Moderate mitral valve stenosis by observation. Tricuspid Valve: The tricuspid  valve is normal in structure. Tricuspid valve regurgitation is mild. Aortic Valve: The aortic valve is normal in structure. Aortic valve regurgitation is not visualized. The aortic valve is structurally normal, with no evidence of sclerosis or stenosis. Aortic valve mean gradient measures 3.0 mmHg. Aortic valve peak gradient measures 5.6 mmHg. Aortic valve area, by VTI measures 2.28 cm. Pulmonic Valve: The pulmonic valve was normal in structure. Pulmonic valve regurgitation is not visualized. Pulmonic regurgitation is not visualized. Aorta: The aortic root, ascending aorta and aortic arch are all structurally normal, with no evidence of dilitation or obstruction. Venous: The inferior vena cava is normal in size with greater than 50% respiratory variability, suggesting right atrial pressure of 3 mmHg. IAS/Shunts: No atrial level shunt detected by color flow Doppler. There is no evidence of a patent foramen ovale. No ventricular septal defect is seen or detected. There is no evidence of an atrial septal defect.  LEFT VENTRICLE PLAX 2D LVIDd:         6.07 cm       Diastology LVIDs:         5.35 cm       LV e' lateral:   4.79 cm/s LV PW:         1.14 cm       LV E/e' lateral: 21.5 LV IVS:        1.33 cm       LV e' medial:    6.74 cm/s LVOT diam:     2.10 cm       LV E/e' medial:  15.3 LV SV:         47 ml LV SV Index:   20.95 LVOT Area:     3.46 cm  LV Volumes (MOD) LV area d, A2C:    48.40 cm LV area d, A4C:    42.10 cm LV area s, A2C:    38.10 cm LV area s, A4C:    33.80 cm LV major d, A2C:   8.92 cm LV major d, A4C:   8.15 cm LV major s, A2C:   8.19 cm LV major s, A4C:   7.35 cm LV vol d, MOD A2C: 217.0 ml LV  vol d, MOD A4C: 176.0 ml LV vol s, MOD A2C: 149.0 ml LV vol s, MOD A4C: 127.0 ml LV SV MOD A2C:     68.0 ml LV SV MOD A4C:     176.0 ml LV SV MOD BP:      59.2 ml RIGHT VENTRICLE RV Mid diam:    3.50 cm RV S prime:     13.40 cm/s TAPSE (M-mode): 1.8 cm LEFT ATRIUM              Index       RIGHT ATRIUM           Index LA diam:        5.20 cm  2.39 cm/m  RA Area:     17.10 cm LA Vol (A2C):   109.0 ml 50.06 ml/m RA Volume:   49.50 ml  22.73 ml/m LA Vol (A4C):   93.8 ml  43.08 ml/m LA Biplane Vol: 105.0 ml 48.22 ml/m  AORTIC VALVE                   PULMONIC VALVE AV Area (Vmax):    2.11 cm    PV Vmax:        0.83 m/s AV Area (Vmean):   2.27 cm  PV Peak grad:   2.7 mmHg AV Area (VTI):     2.28 cm    RVOT Peak grad: 1 mmHg AV Vmax:           118.00 cm/s AV Vmean:          80.400 cm/s AV VTI:            0.158 m AV Peak Grad:      5.6 mmHg AV Mean Grad:      3.0 mmHg LVOT Vmax:         71.80 cm/s LVOT Vmean:        52.700 cm/s LVOT VTI:          0.104 m LVOT/AV VTI ratio: 0.66  AORTA Ao Root diam: 3.40 cm MITRAL VALVE                        TRICUSPID VALVE MV Area (PHT): 3.89 cm             TR Peak grad:   43.3 mmHg MV PHT:        56.55 msec           TR Vmax:        332.00 cm/s MV Decel Time: 195 msec MV E velocity: 103.00 cm/s 103 cm/s SHUNTS                                     Systemic VTI:  0.10 m                                     Systemic Diam: 2.10 cm  Ida Rogue MD Electronically signed by Ida Rogue MD Signature Date/Time: 03/01/2019/1:08:54 PM    Final     EKG: Independently reviewed.   Assessment/Plan Active Problems:    Chest pain -Believed related to another episode of V. tach rather than NSTEMI -Patient is currently chest pain-free.  EKG unchanged from EKG done on 02/27/2018 T wave inversion in anterolateral leads -Troponin 49 which is down from the 60s -Continue to cycle troponin -Cardiology consult -Monitor magnesium and potassium and correct -Monitor in stepdown  History  of sustained ventricular tachycardia 02/28/19 Patient had electrical cardioversion and was on amiodarone infusion from 02/28/19-03/02/23 during his first p.o. dose at 3 PM on 03/02/2019 per pharmacy -following admission patient had 10 beat run of V. Tach -Restart amiodarone at low titration rate and again transition to oral after 24 hrs or as determined by cardiology. Discussed with pharmacist, Shanon Brow  Elevated troponin Suspect related to demand ischemia Troponin has trended down from prior troponin II days prior Continue to cycle troponins    Chronic systolic CHF (congestive heart failure) (HCC) Echocardiogram on 02/27/2018 showed EF 25 to 30% and plan was to consider LifeVest Chest x-ray showed recently confluent mixed interstitial and airspace disease throughout both lungs could reflect edema or atypical infection Continue beta-blockers and ACE inhibitor IV Lasix Salt and fluid restriction.  Daily weights    CAD (coronary artery disease) -Stress test showed findings consistent with prior MI -Continue to cycle troponins -Antiplatelets, statins, beta-blockers -Nitroglycerin sublingual as needed chest pain with morphine for breakthrough    DVT prophylaxis: lovenox Code Status: full code  Family Communication: none  Disposition Plan: Back to previous home environment Consults called: Dr Nehemiah Massed  Andris Baumann MD [facility]     03/03/2019, 2:44 AM

## 2019-03-03 NOTE — Progress Notes (Signed)
PROGRESS NOTE    Jorryn Casagrande  WER:[id number] DOB: [birth year] DOA: 03/02/2019 PCP: Patient, No Pcp Per      Brief Narrative:  Mr. Timothy is a 59 y.o. M with COPD not on O2, chronic pain on daily oxycodone and HTN who presented 1/23 with Vtach requiring electrical cardioversion and found to have new reduced EF 25-30%.  Left AMA on 1/25, returned later that day.  In the ER here he was saturating well on room air, had RR 22.  CXR showed bilateral opacities.  Started on IV Lasix and Cardiology were consulted.       Assessment & Plan:  Acute on chronic systolic CHF -Furosemide 40 mg IV twice a day  -K supplement -Strict I/Os, daily weights, telemetry  -Daily monitoring renal function -Consult Cardiology -Will need ischemic evaluation and then EP evaluation -Continue lisinopril -Continue atorvastatin    Ventricular tachycardia -Continue amiodarone -Plan for transfer for ICD  COPD No active bronchospasm   Hypertension Blood pressure soft -Continue lisinopril  Chronic pain -Continue home oxycodone 15 five times daily       Disposition: The patient was admitted with acute on chronic systolic CHF.   I will discharge when his CHF is resolved, he was weaned off of oxygen, and cardiology have made determination about the timing of ICD placement and ischemic work-up.        MDM: This is a no charge note.  For further details, please see H&P by my partner Dr. Para March from earlier today.  The below labs and imaging reports were reviewed and summarized above.    DVT prophylaxis: Lovenox Code Status: Full code Family Communication:     Consultants:   Cardiology  Procedures:     Antimicrobials:      Culture data:              Subjective: Patient is got some substernal chest discomfort, some low back pain.        Objective: Vitals:   03/03/19 1500 03/03/19 1545 03/03/19 1600 03/03/19 1630  BP: 117/81  110/69 114/74  Pulse: 66 73 71  78  Resp: 16     Temp:      TempSrc:      SpO2: 98% 100% 100% 100%  Weight:      Height:        Intake/Output Summary (Last 24 hours) at 03/03/2019 1641 Last data filed at 03/03/2019 1409 Gross per 24 hour  Intake 3 ml  Output 2225 ml  Net -2222 ml   Filed Weights   03/02/19 2345  Weight: 95.3 kg    Examination: The patient was seen and examined.      Data Reviewed: I have personally reviewed following labs and imaging studies:  CBC: Recent Labs  Lab 02/28/19 1026 03/01/19 0443 03/02/19 0725 03/02/19 2347  WBC 14.4* 12.5* 11.3* 10.7*  HGB 13.9 13.7 11.9* 11.8*  HCT 42.0 41.4 35.5* 35.5*  MCV 85.9 86.4 87.0 86.6  PLT 260 212 155 176   Basic Metabolic Panel: Recent Labs  Lab 02/28/19 1026 03/01/19 0802 03/02/19 0725 03/02/19 2347 03/03/19 0218  NA 135 135 136 135  --   K 3.6 3.8 4.0 3.7  --   CL 97* 99 99 98  --   CO2 24 24 28 27   --   GLUCOSE 176* 102* 120* 130*  --   BUN 36* 40* 31* 30*  --   CREATININE 0.97 1.06 0.73 0.77  --   CALCIUM 8.4* 8.0* 7.7*  7.7*  --   MG 2.0  --   --   --  1.9   GFR: Estimated Creatinine Clearance: 122.1 mL/min (by C-G formula based on SCr of 0.77 mg/dL). Liver Function Tests: Recent Labs  Lab 02/28/19 1026  AST 96*  ALT 172*  ALKPHOS 76  BILITOT 1.6*  PROT 7.0  ALBUMIN 3.8   No results for input(s): LIPASE, AMYLASE in the last 168 hours. No results for input(s): AMMONIA in the last 168 hours. Coagulation Profile: Recent Labs  Lab 02/28/19 1026  INR 1.2   Cardiac Enzymes: No results for input(s): CKTOTAL, CKMB, CKMBINDEX, TROPONINI in the last 168 hours. BNP (last 3 results) No results for input(s): PROBNP in the last 8760 hours. HbA1C: No results for input(s): HGBA1C in the last 72 hours. CBG: Recent Labs  Lab 02/28/19 1041 02/28/19 1550 03/01/19 0723 03/01/19 1117  GLUCAP 154* 96 100* 125*   Lipid Profile: Recent Labs    03/01/19 0443  CHOL 162  HDL 22*  LDLCALC 119*  TRIG 104  CHOLHDL  7.4   Thyroid Function Tests: Recent Labs    02/28/19 1817  TSH 2.095   Anemia Panel: No results for input(s): VITAMINB12, FOLATE, FERRITIN, TIBC, IRON, RETICCTPCT in the last 72 hours. Urine analysis: No results found for: COLORURINE, APPEARANCEUR, LABSPEC, PHURINE, GLUCOSEU, HGBUR, BILIRUBINUR, KETONESUR, PROTEINUR, UROBILINOGEN, NITRITE, LEUKOCYTESUR Sepsis Labs: @LABRCNTIP (procalcitonin:4,lacticacidven:4)  ) Recent Results (from the past 240 hour(s))  Respiratory Panel by RT PCR (Flu A&B, Covid) - Nasopharyngeal Swab     Status: None   Collection Time: 02/28/19 12:53 PM   Specimen: Nasopharyngeal Swab  Result Value Ref Range Status   SARS Coronavirus 2 by RT PCR NEGATIVE NEGATIVE Final    Comment: (NOTE) SARS-CoV-2 target nucleic acids are NOT DETECTED. The SARS-CoV-2 RNA is generally detectable in upper respiratoy specimens during the acute phase of infection. The lowest concentration of SARS-CoV-2 viral copies this assay can detect is 131 copies/mL. A negative result does not preclude SARS-Cov-2 infection and should not be used as the sole basis for treatment or other patient management decisions. A negative result may occur with  improper specimen collection/handling, submission of specimen other than nasopharyngeal swab, presence of viral mutation(s) within the areas targeted by this assay, and inadequate number of viral copies (<131 copies/mL). A negative result must be combined with clinical observations, patient history, and epidemiological information. The expected result is Negative. Fact Sheet for Patients:  PinkCheek.be Fact Sheet for Healthcare Providers:  GravelBags.it This test is not yet ap proved or cleared by the [place] FDA and  has been authorized for detection and/or diagnosis of SARS-CoV-2 by FDA under an Emergency Use Authorization (EUA). This EUA will remain  in effect (meaning this  test can be used) for the duration of the COVID-19 declaration under Section 564(b)(1) of the Act, 21 U.S.C. section 360bbb-3(b)(1), unless the authorization is terminated or revoked sooner.    Influenza A by PCR NEGATIVE NEGATIVE Final   Influenza B by PCR NEGATIVE NEGATIVE Final    Comment: (NOTE) The Xpert Xpress SARS-CoV-2/FLU/RSV assay is intended as an aid in  the diagnosis of influenza from Nasopharyngeal swab specimens and  should not be used as a sole basis for treatment. Nasal washings and  aspirates are unacceptable for Xpert Xpress SARS-CoV-2/FLU/RSV  testing. Fact Sheet for Patients: PinkCheek.be Fact Sheet for Healthcare Providers: GravelBags.it This test is not yet approved or cleared by the [place] FDA and  has been authorized for detection  and/or diagnosis of SARS-CoV-2 by  FDA under an Emergency Use Authorization (EUA). This EUA will remain  in effect (meaning this test can be used) for the duration of the  Covid-19 declaration under Section 564(b)(1) of the Act, 21  U.S.C. section 360bbb-3(b)(1), unless the authorization is  terminated or revoked. Performed at [facility], [address] [place], [place] [address]   MRSA PCR Screening     Status: None   Collection Time: 02/28/19  3:56 PM   Specimen: Nasopharyngeal  Result Value Ref Range Status   MRSA by PCR NEGATIVE NEGATIVE Final    Comment:        The GeneXpert MRSA Assay (FDA approved for NASAL specimens only), is one component of a comprehensive MRSA colonization surveillance program. It is not intended to diagnose MRSA infection nor to guide or monitor treatment for MRSA infections. Performed at [facility], [address] [place], [place] [address]   SARS CORONAVIRUS 2 (TAT 6-24 HRS) Nasopharyngeal Nasopharyngeal Swab     Status: None   Collection Time: 03/03/19  2:50 AM   Specimen: Nasopharyngeal Swab    Result Value Ref Range Status   SARS Coronavirus 2 NEGATIVE NEGATIVE Final    Comment: (NOTE) SARS-CoV-2 target nucleic acids are NOT DETECTED. The SARS-CoV-2 RNA is generally detectable in upper and lower respiratory specimens during the acute phase of infection. Negative results do not preclude SARS-CoV-2 infection, do not rule out co-infections with other pathogens, and should not be used as the sole basis for treatment or other patient management decisions. Negative results must be combined with clinical observations, patient history, and epidemiological information. The expected result is Negative. Fact Sheet for Patients: HairSlick.no Fact Sheet for Healthcare Providers: quierodirigir.com This test is not yet approved or cleared by the [place] FDA and  has been authorized for detection and/or diagnosis of SARS-CoV-2 by FDA under an Emergency Use Authorization (EUA). This EUA will remain  in effect (meaning this test can be used) for the duration of the COVID-19 declaration under Section 56 4(b)(1) of the Act, 21 U.S.C. section 360bbb-3(b)(1), unless the authorization is terminated or revoked sooner. Performed at [facility], [address], [place], [place] [address]          Radiology Studies: NM Myocar Multi W/Spect Izetta Dakin Motion / EF  Result Date: 03/02/2019  Blood pressure demonstrated a normal response to exercise.  There was no ST segment deviation noted during stress.  Defect 1: There is a medium defect of moderate severity present in the basal inferior, basal inferolateral, mid inferior and mid inferolateral location.  Findings consistent with prior myocardial infarction with peri-infarct ischemia.  This is an intermediate risk study.  The left ventricular ejection fraction is moderately decreased (30-44%).    DG Chest Port 1 View  Result Date: 03/02/2019 CLINICAL DATA:  Chest pain EXAM:  PORTABLE CHEST 1 VIEW COMPARISON:  Radiograph and CT 02/28/2019 FINDINGS: Increasingly confluent areas of mixed airspace and reticular opacity throughout both lungs most pronounced in the left mid lung and lung bases. Small bilateral effusions are present, right slightly greater than left. No pneumothorax. The cardiomediastinal contours are stable from prior. No acute osseous or soft tissue abnormality. Degenerative changes are present in the imaged spine and shoulders. Telemetry leads overlie the chest. IMPRESSION: Increasingly confluent mixed interstitial and airspace disease throughout both lungs could reflect edema or atypical infection. Electronically Signed   By: Kreg Shropshire M.D.   On: 03/02/2019 23:54  Scheduled Meds: . amiodarone  400 mg Oral BID  . [START ON 03/04/2019] aspirin EC  81 mg Oral Daily  . atorvastatin  10 mg Oral q1800  . enoxaparin (LOVENOX) injection  40 mg Subcutaneous Q24H  . furosemide  40 mg Intravenous BID  . lisinopril  5 mg Oral Daily  . sodium chloride flush  3 mL Intravenous Q12H   Continuous Infusions:   LOS: 0 days    Time spent: 15 minutes    Alberteen Sam, MD [facility] 03/03/2019, 4:41 PM     Please page though AMION or Epic secure chat:  For password, contact charge nurse

## 2019-03-03 NOTE — ED Notes (Signed)
Pt provided with dinner at this time.

## 2019-03-03 NOTE — ED Notes (Signed)
apon entering patients room, noticed patient has oxygen saturation of 80% on RA. Patient was prompted to take slow, deep respirations. Oxygen saturation was increased to 90%, patient was placed on 2 LPM via [place] and oxygen saturations increased to 95%.

## 2019-03-03 NOTE — ED Notes (Signed)
Pt provided with water and phone per pt's request. This RN will continue to monitor pt.

## 2019-03-03 NOTE — ED Notes (Signed)
Pt A/Ox4. Pt  denies CP/SOB at this time.

## 2019-03-03 NOTE — Consult Note (Signed)
[facility] Cardiology Consultation Note  Patient ID: Brent [name], MRN: [id number], DOB/AGE: 03/19/1960 59 y.o. Admit date: 03/02/2019   Date of Consult: 03/03/2019 Primary Physician: Patient, No Pcp Per Primary Cardiologist: None  Chief Complaint:  Chief Complaint  Patient presents with  . Chest Pain   Reason for Consult: Congestive heart failure with abnormal stress test  HPI: 58 y.o. male with known chronic obstructive pulmonary disease with tobacco abuse hypertension who was admitted to the hospital for issues of shortness of breath.  The patient during her hospitalization had an abnormal EKG showing normal sinus rhythm with inferior infarct age undetermined with preventricular contractions.  There was a slight elevation of troponin possibly consistent with demand ischemia and at that time.  BNP has been elevated to 999 and the patient has had a chest x-ray, showing pulmonary edema.  He had a stress test due to runs of ventricular tachycardia potentially needing further intervention.  He on that stress test has had a large inferior distal posterior lateral myocardial infarction with some minimal reversibility as well.  The patient has been placed on amiodarone due to these runs of ventricular tachycardia.  He left AMA and therefore had come back with severe shortness of breath needing further treatment.  Since been being back he has improved with less shortness of breath at this time and no evidence of ventricular tachycardia  Past Medical History:  Diagnosis Date  . COPD (chronic obstructive pulmonary disease) (HCC)   . Hypertension       Surgical History: History reviewed. No pertinent surgical history.   Home Meds: Prior to Admission medications   Medication Sig Start Date End Date Taking? Authorizing Provider  cyclobenzaprine (FLEXERIL) 10 MG tablet Take 10 mg by mouth 3 (three) times daily as needed for muscle spasms.   Yes [provider]  gabapentin (NEURONTIN) 300  MG capsule Take 300 mg by mouth 3 (three) times daily as needed (pain).    Yes [provider]  Multiple Vitamin (MULTIVITAMIN WITH MINERALS) TABS tablet Take 1 tablet by mouth daily.   Yes [provider]  naproxen sodium (ALEVE) 220 MG tablet Take 220-440 mg by mouth 2 (two) times daily as needed (pain).   Yes [provider]  oxyCODONE (ROXICODONE) 15 MG immediate release tablet Take 15 mg by mouth See admin instructions. Take 1 tablet (15mg ) by mouth three to four times daily 02/11/19  Yes [provider]    Inpatient Medications:  . amiodarone  400 mg Oral BID  . [START ON 03/04/2019] aspirin EC  81 mg Oral Daily  . atorvastatin  10 mg Oral q1800  . enoxaparin (LOVENOX) injection  40 mg Subcutaneous Q24H  . furosemide  40 mg Intravenous BID  . lisinopril  5 mg Oral Daily     Allergies: No Known Allergies  Social History   Socioeconomic History  . Marital status: Married    Spouse name: Not on file  . Number of children: Not on file  . Years of education: Not on file  . Highest education level: Not on file  Occupational History  . Not on file  Tobacco Use  . Smoking status: Current Every Day Smoker    Packs/day: 2.00    Types: Cigarettes  . Smokeless tobacco: Never Used  Substance and Sexual Activity  . Alcohol use: Not Currently  . Drug use: Not Currently  . Sexual activity: Not on file  Other Topics Concern  . Not on file  Social  History Narrative  . Not on file   Social Determinants of Health   Financial Resource Strain:   . Difficulty of Paying Living Expenses: Not on file  Food Insecurity:   . Worried About Programme researcher, broadcasting/film/video in the Last Year: Not on file  . Ran Out of Food in the Last Year: Not on file  Transportation Needs:   . Lack of Transportation (Medical): Not on file  . Lack of Transportation (Non-Medical): Not on file  Physical Activity:   . Days of Exercise per Week: Not on file  . Minutes of Exercise per  Session: Not on file  Stress:   . Feeling of Stress : Not on file  Social Connections:   . Frequency of Communication with Friends and Family: Not on file  . Frequency of Social Gatherings with Friends and Family: Not on file  . Attends Religious Services: Not on file  . Active Member of Clubs or Organizations: Not on file  . Attends Banker Meetings: Not on file  . Marital Status: Not on file  Intimate Partner Violence:   . Fear of Current or Ex-Partner: Not on file  . Emotionally Abused: Not on file  . Physically Abused: Not on file  . Sexually Abused: Not on file     History reviewed. No pertinent family history.   Review of Systems Positive for shortness of breath Negative for: General:  chills, fever, night sweats or weight changes.  Cardiovascular: PND orthopnea syncope dizziness  Dermatological skin lesions rashes Respiratory: Cough congestion Urologic: Frequent urination urination at night and hematuria Abdominal: negative for nausea, vomiting, diarrhea, bright red blood per rectum, melena, or hematemesis Neurologic: negative for visual changes, and/or hearing changes  All other systems reviewed and are otherwise negative except as noted above.  Labs: No results for input(s): CKTOTAL, CKMB, TROPONINI in the last 72 hours. Lab Results  Component Value Date   WBC 10.7 (H) 03/02/2019   HGB 11.8 (L) 03/02/2019   HCT 35.5 (L) 03/02/2019   MCV 86.6 03/02/2019   PLT 176 03/02/2019    Recent Labs  Lab 02/28/19 1026 03/01/19 0802 03/02/19 2347  NA 135   < > 135  K 3.6   < > 3.7  CL 97*   < > 98  CO2 24   < > 27  BUN 36*   < > 30*  CREATININE 0.97   < > 0.77  CALCIUM 8.4*   < > 7.7*  PROT 7.0  --   --   BILITOT 1.6*  --   --   ALKPHOS 76  --   --   ALT 172*  --   --   AST 96*  --   --   GLUCOSE 176*   < > 130*   < > = values in this interval not displayed.   Lab Results  Component Value Date   CHOL 162 03/01/2019   HDL 22 (L) 03/01/2019    LDLCALC 119 (H) 03/01/2019   TRIG 104 03/01/2019   No results found for: DDIMER  Radiology/Studies:  CT ANGIO CHEST PE W OR WO CONTRAST  Result Date: 02/28/2019 CLINICAL DATA:  Respiratory failure EXAM: CT ANGIOGRAPHY CHEST WITH CONTRAST TECHNIQUE: Multidetector CT imaging of the chest was performed using the standard protocol during bolus administration of intravenous contrast. Multiplanar CT image reconstructions and MIPs were obtained to evaluate the vascular anatomy. CONTRAST:  61mL OMNIPAQUE IOHEXOL 350 MG/ML SOLN COMPARISON:  None. FINDINGS: Cardiovascular: Contrast  injection is sufficient to demonstrate satisfactory opacification of the pulmonary arteries to the segmental level. There is no pulmonary embolus. The main pulmonary artery is within normal limits for size. There is no CT evidence of acute right heart strain. Atherosclerotic changes are noted of the thoracic aorta. Heart size is enlarged. Coronary artery calcifications are noted. There is no significant pericardial effusion. There is reflux of contrast into the IVC consistent with some degree of cardiac dysfunction. Mediastinum/Nodes: --enlarged mediastinal and hilar lymph nodes are noted. --No axillary lymphadenopathy. --No supraclavicular lymphadenopathy. --Normal thyroid gland. --The esophagus is unremarkable Lungs/Pleura: Emphysematous changes are noted bilaterally. There is interlobular septal thickening and scattered primarily perihilar ground-glass airspace opacities. Small bilateral pleural effusions are noted, right greater than left. The trachea is unremarkable. Upper Abdomen: There is a questionable nodular surface of the liver. There is a small amount of ascites in the upper abdomen. Musculoskeletal: No chest wall abnormality. No acute or significant osseous findings. Review of the MIP images confirms the above findings. IMPRESSION: 1. No acute pulmonary embolism. 2. Cardiomegaly with small bilateral pleural effusions and  findings suggestive of pulmonary edema. A superimposed atypical infectious process is difficult to exclude. There is reflux of contrast in the IVC consistent with underlying cardiac dysfunction. 3. Probable reactive mediastinal and hilar adenopathy. 4. Small volume ascites in the upper abdomen. 5. Questionable nodular surface of the liver which may indicate underlying cirrhosis. Aortic Atherosclerosis (ICD10-I70.0) and Emphysema (ICD10-J43.9). Electronically Signed   By: Katherine Mantle M.D.   On: 02/28/2019 17:30   NM Myocar Multi W/Spect W/Wall Motion / EF  Result Date: 03/02/2019  Blood pressure demonstrated a normal response to exercise.  There was no ST segment deviation noted during stress.  Defect 1: There is a medium defect of moderate severity present in the basal inferior, basal inferolateral, mid inferior and mid inferolateral location.  Findings consistent with prior myocardial infarction with peri-infarct ischemia.  This is an intermediate risk study.  The left ventricular ejection fraction is moderately decreased (30-44%).    DG Chest Port 1 View  Result Date: 03/02/2019 CLINICAL DATA:  Chest pain EXAM: PORTABLE CHEST 1 VIEW COMPARISON:  Radiograph and CT 02/28/2019 FINDINGS: Increasingly confluent areas of mixed airspace and reticular opacity throughout both lungs most pronounced in the left mid lung and lung bases. Small bilateral effusions are present, right slightly greater than left. No pneumothorax. The cardiomediastinal contours are stable from prior. No acute osseous or soft tissue abnormality. Degenerative changes are present in the imaged spine and shoulders. Telemetry leads overlie the chest. IMPRESSION: Increasingly confluent mixed interstitial and airspace disease throughout both lungs could reflect edema or atypical infection. Electronically Signed   By: Kreg Shropshire M.D.   On: 03/02/2019 23:54   DG Chest Portable 1 View  Result Date: 02/28/2019 CLINICAL DATA:   59 year old male with a history of chest pain and shortness of breath with V-tach EXAM: PORTABLE CHEST 1 VIEW COMPARISON:  None. FINDINGS: Cardiomediastinal silhouette borderline enlarged. Low lung volumes. Reticular opacities throughout the lungs. No confluent airspace disease. No pneumothorax. No large pleural effusion. Defibrillator pads on the low chest. Degenerative changes of the spine and bilateral shoulders IMPRESSION: Reticular opacity of the lungs may reflect early pulmonary edema and/or atypical infection. Defibrillator pads on the low chest wall. Electronically Signed   By: Gilmer Mor D.O.   On: 02/28/2019 11:02   ECHOCARDIOGRAM COMPLETE  Result Date: 03/01/2019   ECHOCARDIOGRAM REPORT   Patient Name:   Brent [name] Date  of Exam: 03/01/2019 Medical Rec #:  [id number]  Height:       72.0 in Accession #:    [id number] Weight:       210.5 lb Date of Birth:  [birth year] BSA:          2.18 m Patient Age:    65 years   BP:           123/90 mmHg Patient Gender: M          HR:           78 bpm. Exam Location:  [facility] Procedure: 2D Echo, Cardiac Doppler and Color Doppler Indications:     Abnormal ECG 794.31  History:         Patient has no prior history of Echocardiogram examinations.                  COPD; Risk Factors:Hypertension.  Sonographer:     Alyse Low Roar Referring Phys:  [place] Diagnosing Phys: Ida Rogue MD IMPRESSIONS  1. Left ventricular ejection fraction, by visual estimation, is 25 to 30%. The left ventricle has severely decreased function. There is mildly increased left ventricular hypertrophy.  2. Mildly dilated left ventricular internal cavity size.  3. The left ventricle demonstrates global hypokinesis.  4. Global right ventricle has low normal systolic function.The right ventricular size is normal. No increase in right ventricular wall thickness.  5. Left atrial size was moderately dilated.  6. The pulmonic valve was normal in structure. Pulmonic valve regurgitation is not  visualized.  7. Moderately elevated pulmonary artery systolic pressure.  8. The tricuspid regurgitant velocity is 3.29 m/s, and with an assumed right atrial pressure of 10 mmHg, the estimated right ventricular systolic pressure is moderately elevated at 53.3 mmHg. FINDINGS  Left Ventricle: Left ventricular ejection fraction, by visual estimation, is 25 to 30%. The left ventricle has severely decreased function. The left ventricle demonstrates global hypokinesis. The left ventricular internal cavity size was mildly dilated left ventricle. There is mildly increased left ventricular hypertrophy. Normal left atrial pressure. Right Ventricle: The right ventricular size is normal. No increase in right ventricular wall thickness. Global RV systolic function is has low normal systolic function. The tricuspid regurgitant velocity is 3.29 m/s, and with an assumed right atrial pressure of 10 mmHg, the estimated right ventricular systolic pressure is moderately elevated at 53.3 mmHg. Left Atrium: Left atrial size was moderately dilated. Right Atrium: Right atrial size was normal in size Pericardium: There is no evidence of pericardial effusion. Mitral Valve: The mitral valve is normal in structure. No evidence of mitral valve regurgitation. Moderate mitral valve stenosis by observation. Tricuspid Valve: The tricuspid valve is normal in structure. Tricuspid valve regurgitation is mild. Aortic Valve: The aortic valve is normal in structure. Aortic valve regurgitation is not visualized. The aortic valve is structurally normal, with no evidence of sclerosis or stenosis. Aortic valve mean gradient measures 3.0 mmHg. Aortic valve peak gradient measures 5.6 mmHg. Aortic valve area, by VTI measures 2.28 cm. Pulmonic Valve: The pulmonic valve was normal in structure. Pulmonic valve regurgitation is not visualized. Pulmonic regurgitation is not visualized. Aorta: The aortic root, ascending aorta and aortic arch are all structurally  normal, with no evidence of dilitation or obstruction. Venous: The inferior vena cava is normal in size with greater than 50% respiratory variability, suggesting right atrial pressure of 3 mmHg. IAS/Shunts: No atrial level shunt detected by color flow Doppler. There is no evidence of a patent foramen  ovale. No ventricular septal defect is seen or detected. There is no evidence of an atrial septal defect.  LEFT VENTRICLE PLAX 2D LVIDd:         6.07 cm       Diastology LVIDs:         5.35 cm       LV e' lateral:   4.79 cm/s LV PW:         1.14 cm       LV E/e' lateral: 21.5 LV IVS:        1.33 cm       LV e' medial:    6.74 cm/s LVOT diam:     2.10 cm       LV E/e' medial:  15.3 LV SV:         47 ml LV SV Index:   20.95 LVOT Area:     3.46 cm  LV Volumes (MOD) LV area d, A2C:    48.40 cm LV area d, A4C:    42.10 cm LV area s, A2C:    38.10 cm LV area s, A4C:    33.80 cm LV major d, A2C:   8.92 cm LV major d, A4C:   8.15 cm LV major s, A2C:   8.19 cm LV major s, A4C:   7.35 cm LV vol d, MOD A2C: 217.0 ml LV vol d, MOD A4C: 176.0 ml LV vol s, MOD A2C: 149.0 ml LV vol s, MOD A4C: 127.0 ml LV SV MOD A2C:     68.0 ml LV SV MOD A4C:     176.0 ml LV SV MOD BP:      59.2 ml RIGHT VENTRICLE RV Mid diam:    3.50 cm RV S prime:     13.40 cm/s TAPSE (M-mode): 1.8 cm LEFT ATRIUM              Index       RIGHT ATRIUM           Index LA diam:        5.20 cm  2.39 cm/m  RA Area:     17.10 cm LA Vol (A2C):   109.0 ml 50.06 ml/m RA Volume:   49.50 ml  22.73 ml/m LA Vol (A4C):   93.8 ml  43.08 ml/m LA Biplane Vol: 105.0 ml 48.22 ml/m  AORTIC VALVE                   PULMONIC VALVE AV Area (Vmax):    2.11 cm    PV Vmax:        0.83 m/s AV Area (Vmean):   2.27 cm    PV Peak grad:   2.7 mmHg AV Area (VTI):     2.28 cm    RVOT Peak grad: 1 mmHg AV Vmax:           118.00 cm/s AV Vmean:          80.400 cm/s AV VTI:            0.158 m AV Peak Grad:      5.6 mmHg AV Mean Grad:      3.0 mmHg LVOT Vmax:         71.80 cm/s LVOT Vmean:         52.700 cm/s LVOT VTI:          0.104 m LVOT/AV VTI ratio: 0.66  AORTA Ao Root diam: 3.40 cm MITRAL VALVE  TRICUSPID VALVE MV Area (PHT): 3.89 cm             TR Peak grad:   43.3 mmHg MV PHT:        56.55 msec           TR Vmax:        332.00 cm/s MV Decel Time: 195 msec MV E velocity: 103.00 cm/s 103 cm/s SHUNTS                                     Systemic VTI:  0.10 m                                     Systemic Diam: 2.10 cm  Julien Nordmann MD Electronically signed by Julien Nordmann MD Signature Date/Time: 03/01/2019/1:08:54 PM    Final     EKG: Normal sinus rhythm with inferior infarct and PVCs  Weights: Filed Weights   03/02/19 2345  Weight: 95.3 kg     Physical Exam: Blood pressure 121/79, pulse 72, temperature 99 F (37.2 C), temperature source Oral, resp. rate 11, height 6' (1.829 m), weight 95.3 kg, SpO2 96 %. Body mass index is 28.48 kg/m. General: Well developed, well nourished, in no acute distress. Head eyes ears nose throat: Normocephalic, atraumatic, sclera non-icteric, no xanthomas, nares are without discharge. No apparent thyromegaly and/or mass  Lungs: Normal respiratory effort.  Some wheezes, basilar rales, no rhonchi.  Heart: RRR with normal S1 S2. no murmur gallop, no rub, PMI is normal size and placement, carotid upstroke normal without bruit, jugular venous pressure is normal Abdomen: Soft, non-tender, non-distended with normoactive bowel sounds. No hepatomegaly. No rebound/guarding. No obvious abdominal masses. Abdominal aorta is normal size without bruit Extremities: No edema. no cyanosis, no clubbing, no ulcers  Peripheral : 2+ bilateral upper extremity pulses, 2+ bilateral femoral pulses, 2+ bilateral dorsal pedal pulse Neuro: Alert and oriented. No facial asymmetry. No focal deficit. Moves all extremities spontaneously. Musculoskeletal: Normal muscle tone without kyphosis Psych:  Responds to questions appropriately with a normal affect.     Assessment: 59 year old male with acute systolic dysfunction congestive heart failure ventricular tachycardia abnormal stress test consistent with ischemia and infarct with elevated troponin consistent with non-ST elevation myocardial infarction and chest x-ray with pulmonary edema slightly improved at this time needing further medical management  Plan: 1.  Continue furosemide for pulmonary edema due to acute LV systolic dysfunction 2.  Continue amiodarone infusion for ventricular tachycardia likely secondary to ischemic cardiomyopathy 3.  Beta-blocker ACE inhibitor for myocardial infarction LV systolic dysfunction cardiomyopathy and treatment of congestive heart failure 4.  Proceed to cardiac catheterization to assess coronary anatomy and further treatment thereof as necessary.  Patient understands the risk and benefits of cardiac catheterization.  This includes the possibility of death stroke heart attack infection bleeding or blood clot.  He is low risk for conscious sedation  Signed, Lamar Blinks M.D. [facility] Cardiology 03/03/2019, 9:56 AM

## 2019-03-03 NOTE — ED Notes (Signed)
Pt denies CP/SOB.Diziines a this time. Pt A/Ox4. NAD noted upon assessment.

## 2019-03-03 NOTE — ED Notes (Addendum)
Pt noted to have 9 beat run of PVCs on monitor.  Rhythm strip printed and EDP notified.  EDP informed admitting MD.

## 2019-03-03 NOTE — ED Notes (Signed)
Pt provide with lunch at time.

## 2019-03-04 ENCOUNTER — Encounter
Admission: EM | Disposition: A | Discharge status: Home / Self Care | Payer: Self-pay | Source: Home / Self Care | Attending: Family Medicine

## 2019-03-04 ENCOUNTER — Encounter: Payer: Self-pay | Admitting: Cardiology

## 2019-03-04 DIAGNOSIS — I509 Heart failure, unspecified: Secondary | ICD-10-CM

## 2019-03-04 DIAGNOSIS — I25118 Atherosclerotic heart disease of native coronary artery with other forms of angina pectoris: Secondary | ICD-10-CM

## 2019-03-04 HISTORY — PX: LEFT HEART CATH AND CORONARY ANGIOGRAPHY: CATH118249

## 2019-03-04 LAB — CBC
HCT: 37.1 % — ABNORMAL LOW (ref 39.0–52.0)
Hemoglobin: 12.2 g/dL — ABNORMAL LOW (ref 13.0–17.0)
MCH: 28.5 pg (ref 26.0–34.0)
MCHC: 32.9 g/dL (ref 30.0–36.0)
MCV: 86.7 fL (ref 80.0–100.0)
Platelets: 197 10*3/uL (ref 150–400)
RBC: 4.28 MIL/uL (ref 4.22–5.81)
RDW: 13.7 % (ref 11.5–15.5)
WBC: 10.5 10*3/uL (ref 4.0–10.5)
nRBC: 0 % (ref 0.0–0.2)

## 2019-03-04 LAB — BASIC METABOLIC PANEL
Anion gap: 9 (ref 5–15)
BUN: 21 mg/dL — ABNORMAL HIGH (ref 6–20)
CO2: 29 mmol/L (ref 22–32)
Calcium: 8 mg/dL — ABNORMAL LOW (ref 8.9–10.3)
Chloride: 98 mmol/L (ref 98–111)
Creatinine, Ser: 0.66 mg/dL (ref 0.61–1.24)
GFR calc Af Amer: >60 mL/min (ref >60)
GFR calc non Af Amer: >60 mL/min (ref >60)
Glucose, Bld: 116 mg/dL — ABNORMAL HIGH (ref 70–99)
Potassium: 3.2 mmol/L — ABNORMAL LOW (ref 3.5–5.1)
Sodium: 136 mmol/L (ref 135–145)

## 2019-03-04 SURGERY — LEFT HEART CATH AND CORONARY ANGIOGRAPHY
Anesthesia: Moderate Sedation

## 2019-03-04 MED ORDER — SODIUM CHLORIDE 0.9 % WEIGHT BASED INFUSION
3.0000 mL/kg/h | INTRAVENOUS | Status: DC
  Administered 2019-03-04: 3 mL/kg/h via INTRAVENOUS

## 2019-03-04 MED ORDER — SODIUM CHLORIDE 0.9 % WEIGHT BASED INFUSION: 1.0000 mL/kg/h | INTRAVENOUS | Status: DC

## 2019-03-04 MED ORDER — FENTANYL CITRATE (PF) 100 MCG/2ML IJ SOLN
INTRAMUSCULAR | Status: DC | PRN
  Administered 2019-03-04: 25 ug via INTRAVENOUS

## 2019-03-04 MED ORDER — ACETAMINOPHEN 325 MG PO TABS: 650.0000 mg | ORAL_TABLET | ORAL | Status: DC | PRN

## 2019-03-04 MED ORDER — ONDANSETRON HCL 4 MG/2ML IJ SOLN: 4.0000 mg | Freq: Four times a day (QID) | INTRAMUSCULAR | Status: DC | PRN

## 2019-03-04 MED ORDER — ATORVASTATIN CALCIUM 80 MG PO TABS
80.0000 mg | ORAL_TABLET | Freq: Every day | ORAL | Status: DC
  Administered 2019-03-04: 18:00:00 80 mg via ORAL
  Filled 2019-03-04: qty 1

## 2019-03-04 MED ORDER — HEPARIN (PORCINE) IN NACL 1000-0.9 UT/500ML-% IV SOLN
INTRAVENOUS | Status: AC
  Filled 2019-03-04: qty 1000

## 2019-03-04 MED ORDER — SODIUM CHLORIDE 0.9 % WEIGHT BASED INFUSION: 1.0000 mL/kg/h | INTRAVENOUS | Status: AC

## 2019-03-04 MED ORDER — POTASSIUM CHLORIDE CRYS ER 20 MEQ PO TBCR
40.0000 meq | EXTENDED_RELEASE_TABLET | Freq: Two times a day (BID) | ORAL | Status: DC
  Administered 2019-03-04: 12:00:00 40 meq via ORAL
  Filled 2019-03-04: qty 2

## 2019-03-04 MED ORDER — CARVEDILOL 3.125 MG PO TABS
3.1250 mg | ORAL_TABLET | Freq: Two times a day (BID) | ORAL | Status: DC
  Administered 2019-03-04 – 2019-03-05 (×2): 3.125 mg via ORAL
  Filled 2019-03-04 (×2): qty 1

## 2019-03-04 MED ORDER — SODIUM CHLORIDE 0.9 % IV SOLN: 250.0000 mL | INTRAVENOUS | Status: DC | PRN

## 2019-03-04 MED ORDER — MIDAZOLAM HCL 2 MG/2ML IJ SOLN
INTRAMUSCULAR | Status: AC
  Filled 2019-03-04: qty 2

## 2019-03-04 MED ORDER — IOHEXOL 300 MG/ML  SOLN
INTRAMUSCULAR | Status: DC | PRN
  Administered 2019-03-04: 14:00:00 150 mL

## 2019-03-04 MED ORDER — NICOTINE 21 MG/24HR TD PT24
21.0000 mg | MEDICATED_PATCH | Freq: Every day | TRANSDERMAL | Status: DC
  Administered 2019-03-04 – 2019-03-05 (×2): 21 mg via TRANSDERMAL
  Filled 2019-03-04 (×3): qty 1

## 2019-03-04 MED ORDER — FENTANYL CITRATE (PF) 100 MCG/2ML IJ SOLN
INTRAMUSCULAR | Status: AC
  Filled 2019-03-04: qty 2

## 2019-03-04 MED ORDER — HEPARIN (PORCINE) IN NACL 1000-0.9 UT/500ML-% IV SOLN
INTRAVENOUS | Status: DC | PRN
  Administered 2019-03-04: 500 mL

## 2019-03-04 MED ORDER — SPIRONOLACTONE 25 MG PO TABS
25.0000 mg | ORAL_TABLET | Freq: Every day | ORAL | Status: DC
  Administered 2019-03-04 – 2019-03-05 (×2): 25 mg via ORAL
  Filled 2019-03-04 (×2): qty 1

## 2019-03-04 MED ORDER — HYDRALAZINE HCL 20 MG/ML IJ SOLN: 10.0000 mg | INTRAMUSCULAR | Status: AC | PRN

## 2019-03-04 MED ORDER — LABETALOL HCL 5 MG/ML IV SOLN: 10.0000 mg | INTRAVENOUS | Status: AC | PRN

## 2019-03-04 MED ORDER — MIDAZOLAM HCL 2 MG/2ML IJ SOLN
INTRAMUSCULAR | Status: DC | PRN
  Administered 2019-03-04: 1 mg via INTRAVENOUS

## 2019-03-04 MED ORDER — SODIUM CHLORIDE 0.9% FLUSH: 3.0000 mL | INTRAVENOUS | Status: DC | PRN

## 2019-03-04 MED ORDER — ASPIRIN 81 MG PO CHEW: 81.0000 mg | CHEWABLE_TABLET | ORAL | Status: DC

## 2019-03-04 SURGICAL SUPPLY — 10 items
CATH INFINITI 5FR ANG PIGTAIL (CATHETERS) ×2 IMPLANT
CATH INFINITI 5FR JL4 (CATHETERS) ×2 IMPLANT
CATH INFINITI JR4 5F (CATHETERS) ×2 IMPLANT
DEVICE CLOSURE MYNXGRIP 5F (Vascular Products) ×2 IMPLANT
KIT MANI 3VAL PERCEP (MISCELLANEOUS) ×3 IMPLANT
NDL PERC 18GX7CM (NEEDLE) IMPLANT
NEEDLE PERC 18GX7CM (NEEDLE) ×3 IMPLANT
PACK CARDIAC CATH (CUSTOM PROCEDURE TRAY) ×3 IMPLANT
SHEATH AVANTI 5FR X 11CM (SHEATH) ×2 IMPLANT
WIRE GUIDERIGHT .035X150 (WIRE) ×2 IMPLANT

## 2019-03-04 NOTE — Progress Notes (Signed)
PROGRESS NOTE    Gerod Caligiuri  AJO:[id number] DOB: Sep 01, 1960 DOA: 03/02/2019 PCP: Patient, No Pcp Per      Brief Narrative:  Mr. Cory is a 59 y.o. M with COPD not on O2, chronic pain on daily oxycodone and HTN who presented 1/23 with Vtach requiring electrical cardioversion and found to have new reduced EF 25-30%.  Left AMA on 1/25, returned later that day.  In the ER here he was saturating well on room air, had RR 22.  CXR showed bilateral opacities.  Started on IV Lasix and Cardiology were consulted.       Assessment & Plan:  Acute on chronic systolic CHF Patient presents with hypoxia, bilateral infiltrates on CXR, BNP 990.  -3.7 L yesterday, creatinine stable, potassium trending down -Continue furosemide 40 mg IV twice a day  -Start K supplement -Strict I/Os, daily weights, telemetry  -Daily monitoring renal function -Consult Cardiology -Will need ischemic evaluation and then EP evaluation -Continue lisinopril -Start carvedilol and spironolactone as able with BP -Continue atorvastatin, aspirin    Ventricular tachycardia Brief, asymptomatic -Continue amiodarone -Continue telemetry -Defer timing of ICD placement to Cardiology  Smoking Smoking cessation recommended -Modalities discussed  COPD No active bronchospasm   Hypertension Blood pressure controlled -Continue lisinopril  Chronic pain -Continue home oxycodone 15 five times daily       Disposition: The patient was admitted with acute on chronic systolic CHF.  He is diuresing and has been weaned off O2 but remains somewhat fluid overloaded on exam.  He underwent cath today showing chronic occlusion, no intervention possible.  Marland Kitchen  He will need continued IV Lasix.  Possibly home tomorrow if euvolemic and if Cardiology feel outpatient referral for ICD is reasonable.         MDM: The below labs and imaging reports reviewed and summarized above.  Medication management as above.    DVT  prophylaxis: Lovenox Code Status: Full code Family Communication: Wife at bedside    Consultants:   Cardiology  Procedures:   1/27 LHC -- chronically ocluded LCx  Antimicrobials:      Culture data:              Subjective: Patient is feeling well, his dyspnea is improved, he still has some mild orthopnea and leg swelling.  No confusion, chest pain, vomiting.        Objective: Vitals:   03/04/19 1415 03/04/19 1430 03/04/19 1445 03/04/19 1503  BP: 107/66 110/64 113/73 109/75  Pulse: 71 72 69 71  Resp: 18 12 14    Temp:    (!) 97.5 F (36.4 C)  TempSrc:    Oral  SpO2: 96% 97% 97% 97%  Weight:      Height:        Intake/Output Summary (Last 24 hours) at 03/04/2019 1703 Last data filed at 03/03/2019 2024 Gross per 24 hour  Intake --  Output 1480 ml  Net -1480 ml   Filed Weights   03/02/19 2345 03/03/19 2051 03/04/19 0423  Weight: 95.3 kg 96.2 kg 96.2 kg    Examination: General appearance:  adult male, alert and in no acute distress.  Lying in bed, eating sandwich. HEENT: Anicteric, conjunctiva pink, lids and lashes normal. No nasal deformity, discharge, epistaxis.  Lips moist, oropharynx moist, no oral lesions.  Hearing normal. Skin: Warm and dry.  No suspicious rashes or lesions. Cardiac: RRR, no murmurs appreciated.  Trace LE edema.    Respiratory: Normal respiratory rate and rhythm.  CTAB without rales or  wheezes. Abdomen: Abdomen soft.  No tenderness palpation or guarding. No ascites, distension, hepatosplenomegaly.   MSK: No deformities or effusions of the large joints of the upper or lower extremities bilaterally. Neuro: Awake and alert. Naming is grossly intact, and the patient's recall, recent and remote, as well as general fund of knowledge seem within normal limits.  Muscle tone normal, without fasciculations.  Moves all extremities equally and with normal coordination.  Speech fluent.    Psych: Sensorium intact and responding to questions,  attention normal. Affect normal.  Judgment and insight appear normal.     Data Reviewed: I have personally reviewed following labs and imaging studies:  CBC: Recent Labs  Lab 02/28/19 1026 03/01/19 0443 03/02/19 0725 03/02/19 2347 03/04/19 0445  WBC 14.4* 12.5* 11.3* 10.7* 10.5  HGB 13.9 13.7 11.9* 11.8* 12.2*  HCT 42.0 41.4 35.5* 35.5* 37.1*  MCV 85.9 86.4 87.0 86.6 86.7  PLT 260 212 155 176 197   Basic Metabolic Panel: Recent Labs  Lab 02/28/19 1026 03/01/19 0802 03/02/19 0725 03/02/19 2347 03/03/19 0218 03/04/19 0445  NA 135 135 136 135  --  136  K 3.6 3.8 4.0 3.7  --  3.2*  CL 97* 99 99 98  --  98  CO2 24 24 28 27   --  29  GLUCOSE 176* 102* 120* 130*  --  116*  BUN 36* 40* 31* 30*  --  21*  CREATININE 0.97 1.06 0.73 0.77  --  0.66  CALCIUM 8.4* 8.0* 7.7* 7.7*  --  8.0*  MG 2.0  --   --   --  1.9  --    GFR: Estimated Creatinine Clearance: 122.5 mL/min (by C-G formula based on SCr of 0.66 mg/dL). Liver Function Tests: Recent Labs  Lab 02/28/19 1026  AST 96*  ALT 172*  ALKPHOS 76  BILITOT 1.6*  PROT 7.0  ALBUMIN 3.8   No results for input(s): LIPASE, AMYLASE in the last 168 hours. No results for input(s): AMMONIA in the last 168 hours. Coagulation Profile: Recent Labs  Lab 02/28/19 1026  INR 1.2   Cardiac Enzymes: No results for input(s): CKTOTAL, CKMB, CKMBINDEX, TROPONINI in the last 168 hours. BNP (last 3 results) No results for input(s): PROBNP in the last 8760 hours. HbA1C: No results for input(s): HGBA1C in the last 72 hours. CBG: Recent Labs  Lab 02/28/19 1041 02/28/19 1550 03/01/19 0723 03/01/19 1117  GLUCAP 154* 96 100* 125*   Lipid Profile: No results for input(s): CHOL, HDL, LDLCALC, TRIG, CHOLHDL, LDLDIRECT in the last 72 hours. Thyroid Function Tests: No results for input(s): TSH, T4TOTAL, FREET4, T3FREE, THYROIDAB in the last 72 hours. Anemia Panel: No results for input(s): VITAMINB12, FOLATE, FERRITIN, TIBC, IRON,  RETICCTPCT in the last 72 hours. Urine analysis: No results found for: COLORURINE, APPEARANCEUR, LABSPEC, PHURINE, GLUCOSEU, HGBUR, BILIRUBINUR, KETONESUR, PROTEINUR, UROBILINOGEN, NITRITE, LEUKOCYTESUR Sepsis Labs: @LABRCNTIP (procalcitonin:4,lacticacidven:4)  ) Recent Results (from the past 240 hour(s))  Respiratory Panel by RT PCR (Flu A&B, Covid) - Nasopharyngeal Swab     Status: None   Collection Time: 02/28/19 12:53 PM   Specimen: Nasopharyngeal Swab  Result Value Ref Range Status   SARS Coronavirus 2 by RT PCR NEGATIVE NEGATIVE Final    Comment: (NOTE) SARS-CoV-2 target nucleic acids are NOT DETECTED. The SARS-CoV-2 RNA is generally detectable in upper respiratoy specimens during the acute phase of infection. The lowest concentration of SARS-CoV-2 viral copies this assay can detect is 131 copies/mL. A negative result does not preclude SARS-Cov-2 infection  and should not be used as the sole basis for treatment or other patient management decisions. A negative result may occur with  improper specimen collection/handling, submission of specimen other than nasopharyngeal swab, presence of viral mutation(s) within the areas targeted by this assay, and inadequate number of viral copies (<131 copies/mL). A negative result must be combined with clinical observations, patient history, and epidemiological information. The expected result is Negative. Fact Sheet for Patients:  https://www.moore.com/ Fact Sheet for Healthcare Providers:  https://www.young.biz/ This test is not yet ap proved or cleared by the [place] FDA and  has been authorized for detection and/or diagnosis of SARS-CoV-2 by FDA under an Emergency Use Authorization (EUA). This EUA will remain  in effect (meaning this test can be used) for the duration of the COVID-19 declaration under Section 564(b)(1) of the Act, 21 U.S.C. section 360bbb-3(b)(1), unless the authorization is  terminated or revoked sooner.    Influenza A by PCR NEGATIVE NEGATIVE Final   Influenza B by PCR NEGATIVE NEGATIVE Final    Comment: (NOTE) The Xpert Xpress SARS-CoV-2/FLU/RSV assay is intended as an aid in  the diagnosis of influenza from Nasopharyngeal swab specimens and  should not be used as a sole basis for treatment. Nasal washings and  aspirates are unacceptable for Xpert Xpress SARS-CoV-2/FLU/RSV  testing. Fact Sheet for Patients: https://www.moore.com/ Fact Sheet for Healthcare Providers: https://www.young.biz/ This test is not yet approved or cleared by the [place] FDA and  has been authorized for detection and/or diagnosis of SARS-CoV-2 by  FDA under an Emergency Use Authorization (EUA). This EUA will remain  in effect (meaning this test can be used) for the duration of the  Covid-19 declaration under Section 564(b)(1) of the Act, 21  U.S.C. section 360bbb-3(b)(1), unless the authorization is  terminated or revoked. Performed at [facility], [address] [place], [place] [address]   MRSA PCR Screening     Status: None   Collection Time: 02/28/19  3:56 PM   Specimen: Nasopharyngeal  Result Value Ref Range Status   MRSA by PCR NEGATIVE NEGATIVE Final    Comment:        The GeneXpert MRSA Assay (FDA approved for NASAL specimens only), is one component of a comprehensive MRSA colonization surveillance program. It is not intended to diagnose MRSA infection nor to guide or monitor treatment for MRSA infections. Performed at [facility], [address] [place], [place] [address]   SARS CORONAVIRUS 2 (TAT 6-24 HRS) Nasopharyngeal Nasopharyngeal Swab     Status: None   Collection Time: 03/03/19  2:50 AM   Specimen: Nasopharyngeal Swab  Result Value Ref Range Status   SARS Coronavirus 2 NEGATIVE NEGATIVE Final    Comment: (NOTE) SARS-CoV-2 target nucleic acids are NOT DETECTED. The SARS-CoV-2 RNA  is generally detectable in upper and lower respiratory specimens during the acute phase of infection. Negative results do not preclude SARS-CoV-2 infection, do not rule out co-infections with other pathogens, and should not be used as the sole basis for treatment or other patient management decisions. Negative results must be combined with clinical observations, patient history, and epidemiological information. The expected result is Negative. Fact Sheet for Patients: HairSlick.no Fact Sheet for Healthcare Providers: quierodirigir.com This test is not yet approved or cleared by the [place] FDA and  has been authorized for detection and/or diagnosis of SARS-CoV-2 by FDA under an Emergency Use Authorization (EUA). This EUA will remain  in effect (meaning this test can be used) for the  duration of the COVID-19 declaration under Section 56 4(b)(1) of the Act, 21 U.S.C. section 360bbb-3(b)(1), unless the authorization is terminated or revoked sooner. Performed at [facility], [address] [place], [place] [address]          Radiology Studies: CARDIAC CATHETERIZATION  Result Date: 03/04/2019  Mid LAD lesion is 65% stenosed.  Mid Cx lesion is 100% stenosed.  Prox RCA to Mid RCA lesion is 30% stenosed.  59 year old male with hypertension hyperlipidemia tobacco abuse having shortness of breath and congestive heart failure with nonsustained ventricular tachycardia LV systolic dysfunction with anterior hypokinesis and inferior posterior lateral akinesis with ejection fraction of 25% Occlusion of mid circumflex after obtuse marginal 1 with codominant 65% proximal LAD stenosis Stress test showing inferior posterior lateral akinesis and infarct without evidence of ischemia Plan Increase medication management for severe dilated cardiomyopathy and LV systolic dysfunction including beta-blocker ACE inhibitor Aldactone High  intensity cholesterol therapy for coronary artery disease Continue amiodarone for 6 sustained ventricular tachycardia Consultation for possible defibrillator placement due to sustained ventricular tachycardia in setting of cardiomyopathy without evidence of myocardial infarction   DG Chest Port 1 View  Result Date: 03/02/2019 CLINICAL DATA:  Chest pain EXAM: PORTABLE CHEST 1 VIEW COMPARISON:  Radiograph and CT 02/28/2019 FINDINGS: Increasingly confluent areas of mixed airspace and reticular opacity throughout both lungs most pronounced in the left mid lung and lung bases. Small bilateral effusions are present, right slightly greater than left. No pneumothorax. The cardiomediastinal contours are stable from prior. No acute osseous or soft tissue abnormality. Degenerative changes are present in the imaged spine and shoulders. Telemetry leads overlie the chest. IMPRESSION: Increasingly confluent mixed interstitial and airspace disease throughout both lungs could reflect edema or atypical infection. Electronically Signed   By: Kreg Shropshire M.D.   On: 03/02/2019 23:54        Scheduled Meds: . amiodarone  400 mg Oral BID  . aspirin EC  81 mg Oral Daily  . atorvastatin  80 mg Oral q1800  . carvedilol  3.125 mg Oral BID WC  . enoxaparin (LOVENOX) injection  40 mg Subcutaneous Q24H  . furosemide  40 mg Intravenous BID  . influenza vac split quadrivalent PF  0.5 mL Intramuscular Tomorrow-1000  . lisinopril  5 mg Oral Daily  . nicotine  21 mg Transdermal Daily  . sodium chloride flush  3 mL Intravenous Q12H  . spironolactone  25 mg Oral Daily   Continuous Infusions:    LOS: 1 day    Time spent: 25 minutes    Alberteen Sam, MD [facility] 03/04/2019, 5:03 PM     Please page though AMION or Epic secure chat:  For password, contact charge nurse

## 2019-03-04 NOTE — Progress Notes (Signed)
[facility] Encounter Note  Patient: Brent [name] / Admit Date: 03/02/2019 / Date of Encounter: 03/04/2019, 1:40 PM   Subjective: Patient has done well overnight no evidence of chest discomfort.  Shortness of breath slowly improving.  No evidence of ventricular tachycardia or anginal symptoms. Minimal troponin elevation most consistent with demand ischemia rather than acute coronary syndrome Cardiac catheterization showing severe dilated cardiomyopathy with global hypokinesis with akinesis of the inferior posterior wall Coronary angiogram shows 60% proximal LAD and occluded circumflex with minimal atherosclerosis of right coronary artery Stress test showing normal perfusion of anterior wall but infarction of inferior posterior wall and no evidence of ischemia Review of Systems: Positive for: Shortness of breath Negative for: Vision change, hearing change, syncope, dizziness, nausea, vomiting,diarrhea, bloody stool, stomach pain, cough, congestion, diaphoresis, urinary frequency, urinary pain,skin lesions, skin rashes Others previously listed  Objective: Telemetry: Normal sinus rhythm Physical Exam: Blood pressure 115/70, pulse 68, temperature 98.4 F (36.9 C), temperature source Oral, resp. rate 19, height 6' (1.829 m), weight 96.2 kg, SpO2 97 %. Body mass index is 28.75 kg/m. General: Well developed, well nourished, in no acute distress. Head: Normocephalic, atraumatic, sclera non-icteric, no xanthomas, nares are without discharge. Neck: No apparent masses Lungs: Normal respirations with no wheezes, no rhonchi, no rales , no crackles   Heart: Regular rate and rhythm, normal S1 S2, no murmur, no rub, no gallop, PMI is normal size and placement, carotid upstroke normal without bruit, jugular venous pressure normal Abdomen: Soft, non-tender, non-distended with normoactive bowel sounds. No hepatosplenomegaly. Abdominal aorta is normal size without bruit Extremities: No  edema, no clubbing, no cyanosis, no ulcers,  Peripheral: 2+ radial, 2+ femoral, 2+ dorsal pedal pulses Neuro: Alert and oriented. Moves all extremities spontaneously. Psych:  Responds to questions appropriately with a normal affect.   Intake/Output Summary (Last 24 hours) at 03/04/2019 1340 Last data filed at 03/03/2019 2024 Gross per 24 hour  Intake --  Output 2280 ml  Net -2280 ml    Inpatient Medications:  . [MAR Hold] amiodarone  400 mg Oral BID  . [START ON 03/05/2019] aspirin  81 mg Oral Pre-Cath  . [MAR Hold] aspirin EC  81 mg Oral Daily  . atorvastatin  80 mg Oral q1800  . carvedilol  3.125 mg Oral BID WC  . [MAR Hold] enoxaparin (LOVENOX) injection  40 mg Subcutaneous Q24H  . [MAR Hold] furosemide  40 mg Intravenous BID  . influenza vac split quadrivalent PF  0.5 mL Intramuscular Tomorrow-1000  . [MAR Hold] lisinopril  5 mg Oral Daily  . [MAR Hold] sodium chloride flush  3 mL Intravenous Q12H  . spironolactone  25 mg Oral Daily   Infusions:  . sodium chloride    . [START ON 03/05/2019] sodium chloride 3 mL/kg/hr (03/04/19 1254)   Followed by  . [START ON 03/05/2019] sodium chloride      Labs: Recent Labs    03/02/19 2347 03/03/19 0218 03/04/19 0445  NA 135  --  136  K 3.7  --  3.2*  CL 98  --  98  CO2 27  --  29  GLUCOSE 130*  --  116*  BUN 30*  --  21*  CREATININE 0.77  --  0.66  CALCIUM 7.7*  --  8.0*  MG  --  1.9  --    No results for input(s): AST, ALT, ALKPHOS, BILITOT, PROT, ALBUMIN in the last 72 hours. Recent Labs    03/02/19 2347 03/04/19 0445  WBC 10.7* 10.5  HGB 11.8* 12.2*  HCT 35.5* 37.1*  MCV 86.6 86.7  PLT 176 197   No results for input(s): CKTOTAL, CKMB, TROPONINI in the last 72 hours. Invalid input(s): POCBNP No results for input(s): HGBA1C in the last 72 hours.   Weights: Filed Weights   03/02/19 2345 03/03/19 2051 03/04/19 0423  Weight: 95.3 kg 96.2 kg 96.2 kg     Radiology/Studies:  CT ANGIO CHEST PE W OR WO  CONTRAST  Result Date: 02/28/2019 CLINICAL DATA:  Respiratory failure EXAM: CT ANGIOGRAPHY CHEST WITH CONTRAST TECHNIQUE: Multidetector CT imaging of the chest was performed using the standard protocol during bolus administration of intravenous contrast. Multiplanar CT image reconstructions and MIPs were obtained to evaluate the vascular anatomy. CONTRAST:  29mL OMNIPAQUE IOHEXOL 350 MG/ML SOLN COMPARISON:  None. FINDINGS: Cardiovascular: Contrast injection is sufficient to demonstrate satisfactory opacification of the pulmonary arteries to the segmental level. There is no pulmonary embolus. The main pulmonary artery is within normal limits for size. There is no CT evidence of acute right heart strain. Atherosclerotic changes are noted of the thoracic aorta. Heart size is enlarged. Coronary artery calcifications are noted. There is no significant pericardial effusion. There is reflux of contrast into the IVC consistent with some degree of cardiac dysfunction. Mediastinum/Nodes: --enlarged mediastinal and hilar lymph nodes are noted. --No axillary lymphadenopathy. --No supraclavicular lymphadenopathy. --Normal thyroid gland. --The esophagus is unremarkable Lungs/Pleura: Emphysematous changes are noted bilaterally. There is interlobular septal thickening and scattered primarily perihilar ground-glass airspace opacities. Small bilateral pleural effusions are noted, right greater than left. The trachea is unremarkable. Upper Abdomen: There is a questionable nodular surface of the liver. There is a small amount of ascites in the upper abdomen. Musculoskeletal: No chest wall abnormality. No acute or significant osseous findings. Review of the MIP images confirms the above findings. IMPRESSION: 1. No acute pulmonary embolism. 2. Cardiomegaly with small bilateral pleural effusions and findings suggestive of pulmonary edema. A superimposed atypical infectious process is difficult to exclude. There is reflux of contrast in  the IVC consistent with underlying cardiac dysfunction. 3. Probable reactive mediastinal and hilar adenopathy. 4. Small volume ascites in the upper abdomen. 5. Questionable nodular surface of the liver which may indicate underlying cirrhosis. Aortic Atherosclerosis (ICD10-I70.0) and Emphysema (ICD10-J43.9). Electronically Signed   By: Constance Holster M.D.   On: 02/28/2019 17:30   NM Myocar Multi W/Spect W/Wall Motion / EF  Result Date: 03/02/2019  Blood pressure demonstrated a normal response to exercise.  There was no ST segment deviation noted during stress.  Defect 1: There is a medium defect of moderate severity present in the basal inferior, basal inferolateral, mid inferior and mid inferolateral location.  Findings consistent with prior myocardial infarction with peri-infarct ischemia.  This is an intermediate risk study.  The left ventricular ejection fraction is moderately decreased (30-44%).    DG Chest Port 1 View  Result Date: 03/02/2019 CLINICAL DATA:  Chest pain EXAM: PORTABLE CHEST 1 VIEW COMPARISON:  Radiograph and CT 02/28/2019 FINDINGS: Increasingly confluent areas of mixed airspace and reticular opacity throughout both lungs most pronounced in the left mid lung and lung bases. Small bilateral effusions are present, right slightly greater than left. No pneumothorax. The cardiomediastinal contours are stable from prior. No acute osseous or soft tissue abnormality. Degenerative changes are present in the imaged spine and shoulders. Telemetry leads overlie the chest. IMPRESSION: Increasingly confluent mixed interstitial and airspace disease throughout both lungs could reflect edema or atypical infection. Electronically  Signed   By: Kreg Shropshire M.D.   On: 03/02/2019 23:54   DG Chest Portable 1 View  Result Date: 02/28/2019 CLINICAL DATA:  59 year old male with a history of chest pain and shortness of breath with V-tach EXAM: PORTABLE CHEST 1 VIEW COMPARISON:  None. FINDINGS:  Cardiomediastinal silhouette borderline enlarged. Low lung volumes. Reticular opacities throughout the lungs. No confluent airspace disease. No pneumothorax. No large pleural effusion. Defibrillator pads on the low chest. Degenerative changes of the spine and bilateral shoulders IMPRESSION: Reticular opacity of the lungs may reflect early pulmonary edema and/or atypical infection. Defibrillator pads on the low chest wall. Electronically Signed   By: Gilmer Mor D.O.   On: 02/28/2019 11:02   ECHOCARDIOGRAM COMPLETE  Result Date: 03/01/2019   ECHOCARDIOGRAM REPORT   Patient Name:   Brent [name] Date of Exam: 03/01/2019 Medical Rec #:  [id number]  Height:       72.0 in Accession #:    [id number] Weight:       210.5 lb Date of Birth:  Nov 26, 1960 BSA:          2.18 m Patient Age:    58 years   BP:           123/90 mmHg Patient Gender: M          HR:           78 bpm. Exam Location:  [facility] Procedure: 2D Echo, Cardiac Doppler and Color Doppler Indications:     Abnormal ECG 794.31  History:         Patient has no prior history of Echocardiogram examinations.                  COPD; Risk Factors:Hypertension.  Sonographer:     Neysa Bonito Roar Referring Phys:  [id number] Eliezer Mccoy PATEL Diagnosing Phys: Julien Nordmann MD IMPRESSIONS  1. Left ventricular ejection fraction, by visual estimation, is 25 to 30%. The left ventricle has severely decreased function. There is mildly increased left ventricular hypertrophy.  2. Mildly dilated left ventricular internal cavity size.  3. The left ventricle demonstrates global hypokinesis.  4. Global right ventricle has low normal systolic function.The right ventricular size is normal. No increase in right ventricular wall thickness.  5. Left atrial size was moderately dilated.  6. The pulmonic valve was normal in structure. Pulmonic valve regurgitation is not visualized.  7. Moderately elevated pulmonary artery systolic pressure.  8. The tricuspid regurgitant velocity is 3.29 m/s, and with an  assumed right atrial pressure of 10 mmHg, the estimated right ventricular systolic pressure is moderately elevated at 53.3 mmHg. FINDINGS  Left Ventricle: Left ventricular ejection fraction, by visual estimation, is 25 to 30%. The left ventricle has severely decreased function. The left ventricle demonstrates global hypokinesis. The left ventricular internal cavity size was mildly dilated left ventricle. There is mildly increased left ventricular hypertrophy. Normal left atrial pressure. Right Ventricle: The right ventricular size is normal. No increase in right ventricular wall thickness. Global RV systolic function is has low normal systolic function. The tricuspid regurgitant velocity is 3.29 m/s, and with an assumed right atrial pressure of 10 mmHg, the estimated right ventricular systolic pressure is moderately elevated at 53.3 mmHg. Left Atrium: Left atrial size was moderately dilated. Right Atrium: Right atrial size was normal in size Pericardium: There is no evidence of pericardial effusion. Mitral Valve: The mitral valve is normal in structure. No evidence of mitral valve regurgitation. Moderate mitral valve stenosis by observation. Tricuspid Valve:  The tricuspid valve is normal in structure. Tricuspid valve regurgitation is mild. Aortic Valve: The aortic valve is normal in structure. Aortic valve regurgitation is not visualized. The aortic valve is structurally normal, with no evidence of sclerosis or stenosis. Aortic valve mean gradient measures 3.0 mmHg. Aortic valve peak gradient measures 5.6 mmHg. Aortic valve area, by VTI measures 2.28 cm. Pulmonic Valve: The pulmonic valve was normal in structure. Pulmonic valve regurgitation is not visualized. Pulmonic regurgitation is not visualized. Aorta: The aortic root, ascending aorta and aortic arch are all structurally normal, with no evidence of dilitation or obstruction. Venous: The inferior vena cava is normal in size with greater than 50% respiratory  variability, suggesting right atrial pressure of 3 mmHg. IAS/Shunts: No atrial level shunt detected by color flow Doppler. There is no evidence of a patent foramen ovale. No ventricular septal defect is seen or detected. There is no evidence of an atrial septal defect.  LEFT VENTRICLE PLAX 2D LVIDd:         6.07 cm       Diastology LVIDs:         5.35 cm       LV e' lateral:   4.79 cm/s LV PW:         1.14 cm       LV E/e' lateral: 21.5 LV IVS:        1.33 cm       LV e' medial:    6.74 cm/s LVOT diam:     2.10 cm       LV E/e' medial:  15.3 LV SV:         47 ml LV SV Index:   20.95 LVOT Area:     3.46 cm  LV Volumes (MOD) LV area d, A2C:    48.40 cm LV area d, A4C:    42.10 cm LV area s, A2C:    38.10 cm LV area s, A4C:    33.80 cm LV major d, A2C:   8.92 cm LV major d, A4C:   8.15 cm LV major s, A2C:   8.19 cm LV major s, A4C:   7.35 cm LV vol d, MOD A2C: 217.0 ml LV vol d, MOD A4C: 176.0 ml LV vol s, MOD A2C: 149.0 ml LV vol s, MOD A4C: 127.0 ml LV SV MOD A2C:     68.0 ml LV SV MOD A4C:     176.0 ml LV SV MOD BP:      59.2 ml RIGHT VENTRICLE RV Mid diam:    3.50 cm RV S prime:     13.40 cm/s TAPSE (M-mode): 1.8 cm LEFT ATRIUM              Index       RIGHT ATRIUM           Index LA diam:        5.20 cm  2.39 cm/m  RA Area:     17.10 cm LA Vol (A2C):   109.0 ml 50.06 ml/m RA Volume:   49.50 ml  22.73 ml/m LA Vol (A4C):   93.8 ml  43.08 ml/m LA Biplane Vol: 105.0 ml 48.22 ml/m  AORTIC VALVE                   PULMONIC VALVE AV Area (Vmax):    2.11 cm    PV Vmax:        0.83 m/s AV Area (Vmean):   2.27 cm  PV Peak grad:   2.7 mmHg AV Area (VTI):     2.28 cm    RVOT Peak grad: 1 mmHg AV Vmax:           118.00 cm/s AV Vmean:          80.400 cm/s AV VTI:            0.158 m AV Peak Grad:      5.6 mmHg AV Mean Grad:      3.0 mmHg LVOT Vmax:         71.80 cm/s LVOT Vmean:        52.700 cm/s LVOT VTI:          0.104 m LVOT/AV VTI ratio: 0.66  AORTA Ao Root diam: 3.40 cm MITRAL VALVE                         TRICUSPID VALVE MV Area (PHT): 3.89 cm             TR Peak grad:   43.3 mmHg MV PHT:        56.55 msec           TR Vmax:        332.00 cm/s MV Decel Time: 195 msec MV E velocity: 103.00 cm/s 103 cm/s SHUNTS                                     Systemic VTI:  0.10 m                                     Systemic Diam: 2.10 cm  Julien Nordmann MD Electronically signed by Julien Nordmann MD Signature Date/Time: 03/01/2019/1:08:54 PM    Final      Assessment and Recommendation  59 y.o. male with known essential hypertension mixed hyperlipidemia tobacco abuse with acute congestive heart failure with minimal appreciation of troponin consistent with demand ischemia and monomorphic ventricular tachycardia now improved with amiodarone and moderate atherosclerosis of coronary arteries with no evidence of need for intervention 1.  Patient is to increase medication management for cardiomyopathy and congestive heart failure adding carvedilol to lisinopril and adding spironolactone today 2.  No further cardiac intervention at this time due to completely occluded circumflex artery with no collateralization and noncritical left anterior descending artery atherosclerosis 3.  Increase Lipitor to 80 mg for high intensity cholesterol therapy 4.  Continue Lasix for pulmonary edema and congestive heart failure without change 5.  Begin ambulation and follow-up for improvements of above 6.  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 FACC

## 2019-03-05 DIAGNOSIS — I5023 Acute on chronic systolic (congestive) heart failure: Secondary | ICD-10-CM

## 2019-03-05 DIAGNOSIS — I472 Ventricular tachycardia: Secondary | ICD-10-CM

## 2019-03-05 LAB — CBC
HCT: 38.7 % — ABNORMAL LOW (ref 39.0–52.0)
Hemoglobin: 12.9 g/dL — ABNORMAL LOW (ref 13.0–17.0)
MCH: 28.7 pg (ref 26.0–34.0)
MCHC: 33.3 g/dL (ref 30.0–36.0)
MCV: 86 fL (ref 80.0–100.0)
Platelets: 241 10*3/uL (ref 150–400)
RBC: 4.5 MIL/uL (ref 4.22–5.81)
RDW: 13.6 % (ref 11.5–15.5)
WBC: 10.1 10*3/uL (ref 4.0–10.5)
nRBC: 0 % (ref 0.0–0.2)

## 2019-03-05 LAB — BASIC METABOLIC PANEL
Anion gap: 9 (ref 5–15)
BUN: 21 mg/dL — ABNORMAL HIGH (ref 6–20)
CO2: 29 mmol/L (ref 22–32)
Calcium: 8.4 mg/dL — ABNORMAL LOW (ref 8.9–10.3)
Chloride: 101 mmol/L (ref 98–111)
Creatinine, Ser: 0.62 mg/dL (ref 0.61–1.24)
GFR calc Af Amer: >60 mL/min (ref >60)
GFR calc non Af Amer: >60 mL/min (ref >60)
Glucose, Bld: 117 mg/dL — ABNORMAL HIGH (ref 70–99)
Potassium: 3.8 mmol/L (ref 3.5–5.1)
Sodium: 139 mmol/L (ref 135–145)

## 2019-03-05 MED ORDER — ENOXAPARIN SODIUM 40 MG/0.4ML ~~LOC~~ SOLN: 40.0000 mg | SUBCUTANEOUS | Status: DC

## 2019-03-05 MED ORDER — FUROSEMIDE 10 MG/ML IJ SOLN: 40.0000 mg | Freq: Two times a day (BID) | INTRAMUSCULAR | 0 refills | Status: DC

## 2019-03-05 MED ORDER — ONDANSETRON HCL 4 MG/2ML IJ SOLN: 4.0000 mg | Freq: Four times a day (QID) | INTRAMUSCULAR | 0 refills | Status: DC | PRN

## 2019-03-05 MED ORDER — NITROGLYCERIN 0.4 MG SL SUBL: 0.4000 mg | SUBLINGUAL_TABLET | SUBLINGUAL | 12 refills | Status: DC | PRN

## 2019-03-05 MED ORDER — ACETAMINOPHEN 325 MG PO TABS: 650.0000 mg | ORAL_TABLET | ORAL | Status: DC | PRN

## 2019-03-05 MED ORDER — ATORVASTATIN CALCIUM 80 MG PO TABS: 80.0000 mg | ORAL_TABLET | Freq: Every day | ORAL | Status: DC

## 2019-03-05 MED ORDER — AMIODARONE HCL 400 MG PO TABS: 400.0000 mg | ORAL_TABLET | Freq: Two times a day (BID) | ORAL | Status: DC

## 2019-03-05 MED ORDER — NICOTINE 21 MG/24HR TD PT24: 21.0000 mg | MEDICATED_PATCH | Freq: Every day | TRANSDERMAL | 0 refills | Status: DC

## 2019-03-05 MED ORDER — CARVEDILOL 3.125 MG PO TABS: 3.1250 mg | ORAL_TABLET | Freq: Two times a day (BID) | ORAL | Status: DC

## 2019-03-05 MED ORDER — OXYCODONE HCL 15 MG PO TABS: 15.0000 mg | ORAL_TABLET | ORAL | 0 refills | Status: AC | PRN

## 2019-03-05 MED ORDER — SPIRONOLACTONE 25 MG PO TABS: 25.0000 mg | ORAL_TABLET | Freq: Every day | ORAL | Status: DC

## 2019-03-05 MED ORDER — LISINOPRIL 5 MG PO TABS: 5.0000 mg | ORAL_TABLET | Freq: Every day | ORAL | Status: DC

## 2019-03-05 MED ORDER — ASPIRIN 81 MG PO TBEC: 81.0000 mg | DELAYED_RELEASE_TABLET | Freq: Every day | ORAL | Status: AC

## 2019-03-05 NOTE — Discharge Summary (Signed)
Physician Discharge Summary  Brent [name] ZOX:[id number] DOB: 1960/06/19 DOA: 03/02/2019  PCP: Patient, No Pcp Per  Admit date: 03/02/2019 Discharge date: 03/05/2019  Admitted From: Home  Disposition:  [facility]    Home Health: N/A  Equipment/Devices: TBD at [facility]  Discharge Condition: Fair  CODE STATUS: FULL Diet recommendation: Cardiac, reduced sodium  Brief/Interim Summary: Brent [name] is a 59 y.o. M with COPD not on O2, chronic pain on daily oxycodone and HTN who presented 1/23 with several days progressive dyspnea, finally severe and associated with chest pain.  EMS found in [place] administered amiodarone and fluids.    In the ER, patient underwent electrical cardioversion.  Subsequently admitted to ICU on amiodarone.  Within 48 hours, patient left AMA due to untreated pain on 1/25, returned later that day.  In the ER on return 1/25 he was tachypneic, required mild supplemental O2.  CXR showed bilateral opacities.  Started on IV Lasix and Cardiology were consulted.      PRINCIPAL HOSPITAL DIAGNOSIS: Acute on chronic systolic CHF, new onset with ventricular tachycardia requiring cardioversion      Discharge Diagnoses:   Acute on chronic systolic CHF Patient presented initially with chest pain, shortness of breath for 1 week, in ventricular tachycardia requiring cardioversion.  Subsequent echocardiogram showed EF 20 to 25%, and the patient was started on diuresis but left the hospital AMA.  He returned with hypoxia, bilateral infiltrates on chest x-ray, BNP 990.  Resumed Lasix, diuresed 4.0 L, with stable creatinine and potassium.  He underwent ischemic evaluation by cardiology, that showed occluded circumflex, no intervention possible.  He was started on lisinopril, spironolactone, carvedilol, aspirin, and atorvastatin for ischemic cardiomyopathy.  Case was discussed with cardiology at [facility], and the patient was recommended to have in-hospital  EP evaluation for possible ICD placement.    Ventricular tachycardia Patient has continued to have brief asymptomatic runs of nonsustained ventricular tachycardia.  Continue amiodarone.  Smoking Smoking cessation recommended, modalities discussed.  Continue nicotine patch.  COPD No active bronchospasm   Hypertension Blood pressure controlled.  Continue carvedilol, lisinopril, spironolactone.  Chronic pain Continue home oxycodone 15 five times daily            Discharge Instructions  Discharge Instructions    AMB Referral to Cardiac Rehabilitation - Phase II   Complete by: As directed    Diagnosis: Heart Failure (see criteria below if ordering Phase II)   Heart Failure Type: Chronic Systolic     Allergies as of 03/05/2019   No Known Allergies     Medication List    STOP taking these medications   cyclobenzaprine 10 MG tablet Commonly known as: FLEXERIL   gabapentin 300 MG capsule Commonly known as: NEURONTIN   multivitamin with minerals Tabs tablet   naproxen sodium 220 MG tablet Commonly known as: ALEVE     TAKE these medications   acetaminophen 325 MG tablet Commonly known as: TYLENOL Take 2 tablets (650 mg total) by mouth every 4 (four) hours as needed for headache or mild pain.   acetaminophen 325 MG tablet Commonly known as: TYLENOL Take 2 tablets (650 mg total) by mouth every 4 (four) hours as needed for headache or mild pain.   amiodarone 400 MG tablet Commonly known as: PACERONE Take 1 tablet (400 mg total) by mouth 2 (two) times daily.   aspirin 81 MG EC tablet Take 1 tablet (81 mg total) by mouth daily. Start taking on: March 06, 2019   atorvastatin 80  MG tablet Commonly known as: LIPITOR Take 1 tablet (80 mg total) by mouth daily at 6 PM.   carvedilol 3.125 MG tablet Commonly known as: COREG Take 1 tablet (3.125 mg total) by mouth 2 (two) times daily with a meal.   enoxaparin 40 MG/0.4ML injection Commonly known as:  LOVENOX Inject 0.4 mLs (40 mg total) into the skin daily. Start taking on: March 06, 2019   furosemide 10 MG/ML injection Commonly known as: LASIX Inject 4 mLs (40 mg total) into the vein 2 (two) times daily.   lisinopril 5 MG tablet Commonly known as: ZESTRIL Take 1 tablet (5 mg total) by mouth daily. Start taking on: March 06, 2019   nicotine 21 mg/24hr patch Commonly known as: NICODERM CQ - dosed in mg/24 hours Place 1 patch (21 mg total) onto the skin daily.   nitroGLYCERIN 0.4 MG SL tablet Commonly known as: NITROSTAT Place 1 tablet (0.4 mg total) under the tongue every 5 (five) minutes x 3 doses as needed for chest pain.   ondansetron 4 MG/2ML Soln injection Commonly known as: ZOFRAN Inject 2 mLs (4 mg total) into the vein every 6 (six) hours as needed for nausea.   ondansetron 4 MG/2ML Soln injection Commonly known as: ZOFRAN Inject 2 mLs (4 mg total) into the vein every 6 (six) hours as needed for nausea.   oxyCODONE 15 MG immediate release tablet Commonly known as: ROXICODONE Take 1 tablet (15 mg total) by mouth every 4 (four) hours as needed for moderate pain. What changed:   when to take this  reasons to take this  additional instructions   spironolactone 25 MG tablet Commonly known as: ALDACTONE Take 1 tablet (25 mg total) by mouth daily. Start taking on: March 06, 2019      Follow-up Information    [facility] Follow up on 03/11/2019.   Specialty: Cardiology Why: at 9:30am. Enter through the Medical Mall entrance Contact information: [address] [place] [address] [phone]         No Known Allergies  Consultations:  Cardiology   Procedures/Studies: CT ANGIO CHEST PE W OR WO CONTRAST  Result Date: 02/28/2019 CLINICAL DATA:  Respiratory failure EXAM: CT ANGIOGRAPHY CHEST WITH CONTRAST TECHNIQUE: Multidetector CT imaging of the chest was performed using  the standard protocol during bolus administration of intravenous contrast. Multiplanar CT image reconstructions and MIPs were obtained to evaluate the vascular anatomy. CONTRAST:  75mL OMNIPAQUE IOHEXOL 350 MG/ML SOLN COMPARISON:  None. FINDINGS: Cardiovascular: Contrast injection is sufficient to demonstrate satisfactory opacification of the pulmonary arteries to the segmental level. There is no pulmonary embolus. The main pulmonary artery is within normal limits for size. There is no CT evidence of acute right heart strain. Atherosclerotic changes are noted of the thoracic aorta. Heart size is enlarged. Coronary artery calcifications are noted. There is no significant pericardial effusion. There is reflux of contrast into the IVC consistent with some degree of cardiac dysfunction. Mediastinum/Nodes: --enlarged mediastinal and hilar lymph nodes are noted. --No axillary lymphadenopathy. --No supraclavicular lymphadenopathy. --Normal thyroid gland. --The esophagus is unremarkable Lungs/Pleura: Emphysematous changes are noted bilaterally. There is interlobular septal thickening and scattered primarily perihilar ground-glass airspace opacities. Small bilateral pleural effusions are noted, right greater than left. The trachea is unremarkable. Upper Abdomen: There is a questionable nodular surface of the liver. There is a small amount of ascites in the upper abdomen. Musculoskeletal: No chest wall abnormality. No acute or significant  osseous findings. Review of the MIP images confirms the above findings. IMPRESSION: 1. No acute pulmonary embolism. 2. Cardiomegaly with small bilateral pleural effusions and findings suggestive of pulmonary edema. A superimposed atypical infectious process is difficult to exclude. There is reflux of contrast in the IVC consistent with underlying cardiac dysfunction. 3. Probable reactive mediastinal and hilar adenopathy. 4. Small volume ascites in the upper abdomen. 5. Questionable nodular  surface of the liver which may indicate underlying cirrhosis. Aortic Atherosclerosis (ICD10-I70.0) and Emphysema (ICD10-J43.9). Electronically Signed   By: Katherine Mantle M.D.   On: 02/28/2019 17:30   CARDIAC CATHETERIZATION  Result Date: 03/04/2019  Mid LAD lesion is 65% stenosed.  Mid Cx lesion is 100% stenosed.  Prox RCA to Mid RCA lesion is 30% stenosed.  59 year old male with hypertension hyperlipidemia tobacco abuse having shortness of breath and congestive heart failure with nonsustained ventricular tachycardia LV systolic dysfunction with anterior hypokinesis and inferior posterior lateral akinesis with ejection fraction of 25% Occlusion of mid circumflex after obtuse marginal 1 with codominant 65% proximal LAD stenosis Stress test showing inferior posterior lateral akinesis and infarct without evidence of ischemia Plan Increase medication management for severe dilated cardiomyopathy and LV systolic dysfunction including beta-blocker ACE inhibitor Aldactone High intensity cholesterol therapy for coronary artery disease Continue amiodarone for 6 sustained ventricular tachycardia Consultation for possible defibrillator placement due to sustained ventricular tachycardia in setting of cardiomyopathy without evidence of myocardial infarction   NM Myocar Multi W/Spect W/Wall Motion / EF  Result Date: 03/02/2019  Blood pressure demonstrated a normal response to exercise.  There was no ST segment deviation noted during stress.  Defect 1: There is a medium defect of moderate severity present in the basal inferior, basal inferolateral, mid inferior and mid inferolateral location.  Findings consistent with prior myocardial infarction with peri-infarct ischemia.  This is an intermediate risk study.  The left ventricular ejection fraction is moderately decreased (30-44%).    DG Chest Port 1 View  Result Date: 03/02/2019 CLINICAL DATA:  Chest pain EXAM: PORTABLE CHEST 1 VIEW COMPARISON:   Radiograph and CT 02/28/2019 FINDINGS: Increasingly confluent areas of mixed airspace and reticular opacity throughout both lungs most pronounced in the left mid lung and lung bases. Small bilateral effusions are present, right slightly greater than left. No pneumothorax. The cardiomediastinal contours are stable from prior. No acute osseous or soft tissue abnormality. Degenerative changes are present in the imaged spine and shoulders. Telemetry leads overlie the chest. IMPRESSION: Increasingly confluent mixed interstitial and airspace disease throughout both lungs could reflect edema or atypical infection. Electronically Signed   By: Kreg Shropshire M.D.   On: 03/02/2019 23:54   DG Chest Portable 1 View  Result Date: 02/28/2019 CLINICAL DATA:  60 year old male with a history of chest pain and shortness of breath with V-tach EXAM: PORTABLE CHEST 1 VIEW COMPARISON:  None. FINDINGS: Cardiomediastinal silhouette borderline enlarged. Low lung volumes. Reticular opacities throughout the lungs. No confluent airspace disease. No pneumothorax. No large pleural effusion. Defibrillator pads on the low chest. Degenerative changes of the spine and bilateral shoulders IMPRESSION: Reticular opacity of the lungs may reflect early pulmonary edema and/or atypical infection. Defibrillator pads on the low chest wall. Electronically Signed   By: Gilmer Mor D.O.   On: 02/28/2019 11:02   ECHOCARDIOGRAM COMPLETE  Result Date: 03/01/2019   ECHOCARDIOGRAM REPORT   Patient Name:   Brent [name] Date of Exam: 03/01/2019 Medical Rec #:  [id number]  Height:       72.0  in Accession #:    [id number] Weight:       210.5 lb Date of Birth:  08-06-1960 BSA:          2.18 m Patient Age:    58 years   BP:           123/90 mmHg Patient Gender: M          HR:           78 bpm. Exam Location:  [facility] Procedure: 2D Echo, Cardiac Doppler and Color Doppler Indications:     Abnormal ECG 794.31  History:         Patient has no prior history of Echocardiogram  examinations.                  COPD; Risk Factors:Hypertension.  Sonographer:     Neysa Bonito Roar Referring Phys:  [id number] Eliezer Mccoy PATEL Diagnosing Phys: Julien Nordmann MD IMPRESSIONS  1. Left ventricular ejection fraction, by visual estimation, is 25 to 30%. The left ventricle has severely decreased function. There is mildly increased left ventricular hypertrophy.  2. Mildly dilated left ventricular internal cavity size.  3. The left ventricle demonstrates global hypokinesis.  4. Global right ventricle has low normal systolic function.The right ventricular size is normal. No increase in right ventricular wall thickness.  5. Left atrial size was moderately dilated.  6. The pulmonic valve was normal in structure. Pulmonic valve regurgitation is not visualized.  7. Moderately elevated pulmonary artery systolic pressure.  8. The tricuspid regurgitant velocity is 3.29 m/s, and with an assumed right atrial pressure of 10 mmHg, the estimated right ventricular systolic pressure is moderately elevated at 53.3 mmHg. FINDINGS  Left Ventricle: Left ventricular ejection fraction, by visual estimation, is 25 to 30%. The left ventricle has severely decreased function. The left ventricle demonstrates global hypokinesis. The left ventricular internal cavity size was mildly dilated left ventricle. There is mildly increased left ventricular hypertrophy. Normal left atrial pressure. Right Ventricle: The right ventricular size is normal. No increase in right ventricular wall thickness. Global RV systolic function is has low normal systolic function. The tricuspid regurgitant velocity is 3.29 m/s, and with an assumed right atrial pressure of 10 mmHg, the estimated right ventricular systolic pressure is moderately elevated at 53.3 mmHg. Left Atrium: Left atrial size was moderately dilated. Right Atrium: Right atrial size was normal in size Pericardium: There is no evidence of pericardial effusion. Mitral Valve: The mitral valve is normal in  structure. No evidence of mitral valve regurgitation. Moderate mitral valve stenosis by observation. Tricuspid Valve: The tricuspid valve is normal in structure. Tricuspid valve regurgitation is mild. Aortic Valve: The aortic valve is normal in structure. Aortic valve regurgitation is not visualized. The aortic valve is structurally normal, with no evidence of sclerosis or stenosis. Aortic valve mean gradient measures 3.0 mmHg. Aortic valve peak gradient measures 5.6 mmHg. Aortic valve area, by VTI measures 2.28 cm. Pulmonic Valve: The pulmonic valve was normal in structure. Pulmonic valve regurgitation is not visualized. Pulmonic regurgitation is not visualized. Aorta: The aortic root, ascending aorta and aortic arch are all structurally normal, with no evidence of dilitation or obstruction. Venous: The inferior vena cava is normal in size with greater than 50% respiratory variability, suggesting right atrial pressure of 3 mmHg. IAS/Shunts: No atrial level shunt detected by color flow Doppler. There is no evidence of a patent foramen ovale. No ventricular septal defect is seen or detected. There is no evidence of an atrial septal  defect.  LEFT VENTRICLE PLAX 2D LVIDd:         6.07 cm       Diastology LVIDs:         5.35 cm       LV e' lateral:   4.79 cm/s LV PW:         1.14 cm       LV E/e' lateral: 21.5 LV IVS:        1.33 cm       LV e' medial:    6.74 cm/s LVOT diam:     2.10 cm       LV E/e' medial:  15.3 LV SV:         47 ml LV SV Index:   20.95 LVOT Area:     3.46 cm  LV Volumes (MOD) LV area d, A2C:    48.40 cm LV area d, A4C:    42.10 cm LV area s, A2C:    38.10 cm LV area s, A4C:    33.80 cm LV major d, A2C:   8.92 cm LV major d, A4C:   8.15 cm LV major s, A2C:   8.19 cm LV major s, A4C:   7.35 cm LV vol d, MOD A2C: 217.0 ml LV vol d, MOD A4C: 176.0 ml LV vol s, MOD A2C: 149.0 ml LV vol s, MOD A4C: 127.0 ml LV SV MOD A2C:     68.0 ml LV SV MOD A4C:     176.0 ml LV SV MOD BP:      59.2 ml RIGHT  VENTRICLE RV Mid diam:    3.50 cm RV S prime:     13.40 cm/s TAPSE (M-mode): 1.8 cm LEFT ATRIUM              Index       RIGHT ATRIUM           Index LA diam:        5.20 cm  2.39 cm/m  RA Area:     17.10 cm LA Vol (A2C):   109.0 ml 50.06 ml/m RA Volume:   49.50 ml  22.73 ml/m LA Vol (A4C):   93.8 ml  43.08 ml/m LA Biplane Vol: 105.0 ml 48.22 ml/m  AORTIC VALVE                   PULMONIC VALVE AV Area (Vmax):    2.11 cm    PV Vmax:        0.83 m/s AV Area (Vmean):   2.27 cm    PV Peak grad:   2.7 mmHg AV Area (VTI):     2.28 cm    RVOT Peak grad: 1 mmHg AV Vmax:           118.00 cm/s AV Vmean:          80.400 cm/s AV VTI:            0.158 m AV Peak Grad:      5.6 mmHg AV Mean Grad:      3.0 mmHg LVOT Vmax:         71.80 cm/s LVOT Vmean:        52.700 cm/s LVOT VTI:          0.104 m LVOT/AV VTI ratio: 0.66  AORTA Ao Root diam: 3.40 cm MITRAL VALVE  TRICUSPID VALVE MV Area (PHT): 3.89 cm             TR Peak grad:   43.3 mmHg MV PHT:        56.55 msec           TR Vmax:        332.00 cm/s MV Decel Time: 195 msec MV E velocity: 103.00 cm/s 103 cm/s SHUNTS                                     Systemic VTI:  0.10 m                                     Systemic Diam: 2.10 cm  Julien Nordmann MD Electronically signed by Julien Nordmann MD Signature Date/Time: 03/01/2019/1:08:54 PM    Final       Subjective: Is feeling well.  Swelling is resolved, his orthopnea is better, he has longer has much dyspnea with exertion.  Discharge Exam: Vitals:   03/05/19 0817 03/05/19 1506  BP: 124/85 120/79  Pulse: 68 67  Resp: 18 18  Temp: 97.8 F (36.6 C) 97.8 F (36.6 C)  SpO2: 98% 95%   Vitals:   03/04/19 2115 03/05/19 0448 03/05/19 0817 03/05/19 1506  BP: 111/72 117/70 124/85 120/79  Pulse: 74 70 68 67  Resp:   18 18  Temp:  98 F (36.7 C) 97.8 F (36.6 C) 97.8 F (36.6 C)  TempSrc:  Oral Oral Oral  SpO2:  97% 98% 95%  Weight:  93.8 kg    Height:        General: Pt is alert,  awake, not in acute distress, sitting on the edge of the bed Cardiovascular: RRR, nl S1-S2, no murmurs appreciated.   No LE edema.   Respiratory: Normal respiratory rate and rhythm.  CTAB without rales or wheezes. Abdominal: Abdomen soft and non-tender.  No distension or HSM.   Neuro/Psych: Strength symmetric in upper and lower extremities.  Judgment and insight appear normal.   The results of significant diagnostics from this hospitalization (including imaging, microbiology, ancillary and laboratory) are listed below for reference.     Microbiology: Recent Results (from the past 240 hour(s))  Respiratory Panel by RT PCR (Flu A&B, Covid) - Nasopharyngeal Swab     Status: None   Collection Time: 02/28/19 12:53 PM   Specimen: Nasopharyngeal Swab  Result Value Ref Range Status   SARS Coronavirus 2 by RT PCR NEGATIVE NEGATIVE Final    Comment: (NOTE) SARS-CoV-2 target nucleic acids are NOT DETECTED. The SARS-CoV-2 RNA is generally detectable in upper respiratoy specimens during the acute phase of infection. The lowest concentration of SARS-CoV-2 viral copies this assay can detect is 131 copies/mL. A negative result does not preclude SARS-Cov-2 infection and should not be used as the sole basis for treatment or other patient management decisions. A negative result may occur with  improper specimen collection/handling, submission of specimen other than nasopharyngeal swab, presence of viral mutation(s) within the areas targeted by this assay, and inadequate number of viral copies (<131 copies/mL). A negative result must be combined with clinical observations, patient history, and epidemiological information. The expected result is Negative. Fact Sheet for Patients:  https://www.moore.com/ Fact Sheet for Healthcare Providers:  https://www.young.biz/ This test is not yet ap proved or cleared by the  [place] Architectural technologist and  has been authorized for  detection and/or diagnosis of SARS-CoV-2 by FDA under an Print production planner (EUA). This EUA will remain  in effect (meaning this test can be used) for the duration of the COVID-19 declaration under Section 564(b)(1) of the Act, 21 U.S.C. section 360bbb-3(b)(1), unless the authorization is terminated or revoked sooner.    Influenza A by PCR NEGATIVE NEGATIVE Final   Influenza B by PCR NEGATIVE NEGATIVE Final    Comment: (NOTE) The Xpert Xpress SARS-CoV-2/FLU/RSV assay is intended as an aid in  the diagnosis of influenza from Nasopharyngeal swab specimens and  should not be used as a sole basis for treatment. Nasal washings and  aspirates are unacceptable for Xpert Xpress SARS-CoV-2/FLU/RSV  testing. Fact Sheet for Patients: PinkCheek.be Fact Sheet for Healthcare Providers: GravelBags.it This test is not yet approved or cleared by the [place] FDA and  has been authorized for detection and/or diagnosis of SARS-CoV-2 by  FDA under an Emergency Use Authorization (EUA). This EUA will remain  in effect (meaning this test can be used) for the duration of the  Covid-19 declaration under Section 564(b)(1) of the Act, 21  U.S.C. section 360bbb-3(b)(1), unless the authorization is  terminated or revoked. Performed at [facility], [address] [place], [place] [address]   MRSA PCR Screening     Status: None   Collection Time: 02/28/19  3:56 PM   Specimen: Nasopharyngeal  Result Value Ref Range Status   MRSA by PCR NEGATIVE NEGATIVE Final    Comment:        The GeneXpert MRSA Assay (FDA approved for NASAL specimens only), is one component of a comprehensive MRSA colonization surveillance program. It is not intended to diagnose MRSA infection nor to guide or monitor treatment for MRSA infections. Performed at [facility], [place], [place] [address]   SARS CORONAVIRUS 2 (TAT  6-24 HRS) Nasopharyngeal Nasopharyngeal Swab     Status: None   Collection Time: 03/03/19  2:50 AM   Specimen: Nasopharyngeal Swab  Result Value Ref Range Status   SARS Coronavirus 2 NEGATIVE NEGATIVE Final    Comment: (NOTE) SARS-CoV-2 target nucleic acids are NOT DETECTED. The SARS-CoV-2 RNA is generally detectable in upper and lower respiratory specimens during the acute phase of infection. Negative results do not preclude SARS-CoV-2 infection, do not rule out co-infections with other pathogens, and should not be used as the sole basis for treatment or other patient management decisions. Negative results must be combined with clinical observations, patient history, and epidemiological information. The expected result is Negative. Fact Sheet for Patients: SugarRoll.be Fact Sheet for Healthcare Providers: https://www.woods-mathews.com/ This test is not yet approved or cleared by the [place] FDA and  has been authorized for detection and/or diagnosis of SARS-CoV-2 by FDA under an Emergency Use Authorization (EUA). This EUA will remain  in effect (meaning this test can be used) for the duration of the COVID-19 declaration under Section 56 4(b)(1) of the Act, 21 U.S.C. section 360bbb-3(b)(1), unless the authorization is terminated or revoked sooner. Performed at [facility], [place] [address], [place], [place] [address]      Labs: BNP (last 3 results) Recent Labs    02/28/19 1026 03/02/19 2347  BNP 1,216.0* 185.6*   Basic Metabolic Panel: Recent Labs  Lab 02/28/19 1026 02/28/19 1026 03/01/19 0802 03/02/19 0725 03/02/19 2347 03/03/19 0218 03/04/19 0445 03/05/19 0634  NA 135   < > 135 136 135  --  136 139  K 3.6   < > 3.8 4.0 3.7  --  3.2* 3.8  CL 97*   < > 99 99 98  --  98 101  CO2 24   < > 24 28 27   --  29 29  GLUCOSE 176*   < > 102* 120* 130*  --  116* 117*  BUN 36*   < > 40* 31* 30*  --  21* 21*  CREATININE 0.97    < > 1.06 0.73 0.77  --  0.66 0.62  CALCIUM 8.4*   < > 8.0* 7.7* 7.7*  --  8.0* 8.4*  MG 2.0  --   --   --   --  1.9  --   --    < > = values in this interval not displayed.   Liver Function Tests: Recent Labs  Lab 02/28/19 1026  AST 96*  ALT 172*  ALKPHOS 76  BILITOT 1.6*  PROT 7.0  ALBUMIN 3.8   No results for input(s): LIPASE, AMYLASE in the last 168 hours. No results for input(s): AMMONIA in the last 168 hours. CBC: Recent Labs  Lab 03/01/19 0443 03/02/19 0725 03/02/19 2347 03/04/19 0445 03/05/19 0634  WBC 12.5* 11.3* 10.7* 10.5 10.1  HGB 13.7 11.9* 11.8* 12.2* 12.9*  HCT 41.4 35.5* 35.5* 37.1* 38.7*  MCV 86.4 87.0 86.6 86.7 86.0  PLT 212 155 176 197 241   Cardiac Enzymes: No results for input(s): CKTOTAL, CKMB, CKMBINDEX, TROPONINI in the last 168 hours. BNP: Invalid input(s): POCBNP CBG: Recent Labs  Lab 02/28/19 1041 02/28/19 1550 03/01/19 0723 03/01/19 1117  GLUCAP 154* 96 100* 125*   D-Dimer No results for input(s): DDIMER in the last 72 hours. Hgb A1c No results for input(s): HGBA1C in the last 72 hours. Lipid Profile No results for input(s): CHOL, HDL, LDLCALC, TRIG, CHOLHDL, LDLDIRECT in the last 72 hours. Thyroid function studies No results for input(s): TSH, T4TOTAL, T3FREE, THYROIDAB in the last 72 hours.  Invalid input(s): FREET3 Anemia work up No results for input(s): VITAMINB12, FOLATE, FERRITIN, TIBC, IRON, RETICCTPCT in the last 72 hours. Urinalysis No results found for: COLORURINE, APPEARANCEUR, LABSPEC, PHURINE, GLUCOSEU, HGBUR, BILIRUBINUR, KETONESUR, PROTEINUR, UROBILINOGEN, NITRITE, LEUKOCYTESUR Sepsis Labs Invalid input(s): PROCALCITONIN,  WBC,  LACTICIDVEN Microbiology Recent Results (from the past 240 hour(s))  Respiratory Panel by RT PCR (Flu A&B, Covid) - Nasopharyngeal Swab     Status: None   Collection Time: 02/28/19 12:53 PM   Specimen: Nasopharyngeal Swab  Result Value Ref Range Status   SARS Coronavirus 2 by RT PCR  NEGATIVE NEGATIVE Final    Comment: (NOTE) SARS-CoV-2 target nucleic acids are NOT DETECTED. The SARS-CoV-2 RNA is generally detectable in upper respiratoy specimens during the acute phase of infection. The lowest concentration of SARS-CoV-2 viral copies this assay can detect is 131 copies/mL. A negative result does not preclude SARS-Cov-2 infection and should not be used as the sole basis for treatment or other patient management decisions. A negative result may occur with  improper specimen collection/handling, submission of specimen other than nasopharyngeal swab, presence of viral mutation(s) within the areas targeted by this assay, and inadequate number of viral copies (<131 copies/mL). A negative result must be combined with clinical observations, patient history, and epidemiological information. The expected result is Negative. Fact Sheet for Patients:  https://www.moore.com/ Fact Sheet for Healthcare Providers:  https://www.young.biz/ This test is not yet ap proved or cleared by the [place] FDA and  has been authorized for detection  and/or diagnosis of SARS-CoV-2 by FDA under an Emergency Use Authorization (EUA). This EUA will remain  in effect (meaning this test can be used) for the duration of the COVID-19 declaration under Section 564(b)(1) of the Act, 21 U.S.C. section 360bbb-3(b)(1), unless the authorization is terminated or revoked sooner.    Influenza A by PCR NEGATIVE NEGATIVE Final   Influenza B by PCR NEGATIVE NEGATIVE Final    Comment: (NOTE) The Xpert Xpress SARS-CoV-2/FLU/RSV assay is intended as an aid in  the diagnosis of influenza from Nasopharyngeal swab specimens and  should not be used as a sole basis for treatment. Nasal washings and  aspirates are unacceptable for Xpert Xpress SARS-CoV-2/FLU/RSV  testing. Fact Sheet for Patients: https://www.moore.com/ Fact Sheet for Healthcare  Providers: https://www.young.biz/ This test is not yet approved or cleared by the [place] FDA and  has been authorized for detection and/or diagnosis of SARS-CoV-2 by  FDA under an Emergency Use Authorization (EUA). This EUA will remain  in effect (meaning this test can be used) for the duration of the  Covid-19 declaration under Section 564(b)(1) of the Act, 21  U.S.C. section 360bbb-3(b)(1), unless the authorization is  terminated or revoked. Performed at [facility], [address] [place], [place] [address]   MRSA PCR Screening     Status: None   Collection Time: 02/28/19  3:56 PM   Specimen: Nasopharyngeal  Result Value Ref Range Status   MRSA by PCR NEGATIVE NEGATIVE Final    Comment:        The GeneXpert MRSA Assay (FDA approved for NASAL specimens only), is one component of a comprehensive MRSA colonization surveillance program. It is not intended to diagnose MRSA infection nor to guide or monitor treatment for MRSA infections. Performed at [facility], [address] [place], [place] [address]   SARS CORONAVIRUS 2 (TAT 6-24 HRS) Nasopharyngeal Nasopharyngeal Swab     Status: None   Collection Time: 03/03/19  2:50 AM   Specimen: Nasopharyngeal Swab  Result Value Ref Range Status   SARS Coronavirus 2 NEGATIVE NEGATIVE Final    Comment: (NOTE) SARS-CoV-2 target nucleic acids are NOT DETECTED. The SARS-CoV-2 RNA is generally detectable in upper and lower respiratory specimens during the acute phase of infection. Negative results do not preclude SARS-CoV-2 infection, do not rule out co-infections with other pathogens, and should not be used as the sole basis for treatment or other patient management decisions. Negative results must be combined with clinical observations, patient history, and epidemiological information. The expected result is Negative. Fact Sheet for  Patients: HairSlick.no Fact Sheet for Healthcare Providers: quierodirigir.com This test is not yet approved or cleared by the [place] FDA and  has been authorized for detection and/or diagnosis of SARS-CoV-2 by FDA under an Emergency Use Authorization (EUA). This EUA will remain  in effect (meaning this test can be used) for the duration of the COVID-19 declaration under Section 56 4(b)(1) of the Act, 21 U.S.C. section 360bbb-3(b)(1), unless the authorization is terminated or revoked sooner. Performed at [facility], [address], [place], [place] [address]      Time coordinating discharge: 35 minutes The [place] controlled substances registry was reviewed for this patient      SIGNED:   Alberteen Sam, MD  [facility] 03/05/2019, 3:07 PM

## 2019-03-05 NOTE — Progress Notes (Signed)
Patient transferred to [facility] via ambulance.  Tele removed prior to discharge.  Report called and given to [facility].
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
## 2019-03-05 NOTE — Progress Notes (Signed)
[facility] Encounter Note  Patient: Brent [name] / Admit Date: 03/02/2019 / Date of Encounter: 03/05/2019, 8:37 AM   Subjective: Patient has done well overnight no evidence of chest discomfort.  Shortness of breath slowly improving.  No evidence of ventricular tachycardia or anginal symptoms. Minimal troponin elevation most consistent with demand ischemia rather than acute coronary syndrome Cardiac catheterization showing severe dilated cardiomyopathy with global hypokinesis with akinesis of the inferior posterior wall Coronary angiogram shows 60% proximal LAD and occluded circumflex with minimal atherosclerosis of right coronary artery Stress test showing normal perfusion of anterior wall but infarction of inferior posterior wall and no evidence of ischemia Long discussion about monomorphic ventricular tachycardia and issues as above needing further intervention including defibrillator placement.  Patient wishes to progress to a defibrillator placement and will need transfer for further intervention Review of Systems: Positive for: Shortness of breath Negative for: Vision change, hearing change, syncope, dizziness, nausea, vomiting,diarrhea, bloody stool, stomach pain, cough, congestion, diaphoresis, urinary frequency, urinary pain,skin lesions, skin rashes Others previously listed  Objective: Telemetry: Normal sinus rhythm Physical Exam: Blood pressure 124/85, pulse 68, temperature 97.8 F (36.6 C), temperature source Oral, resp. rate 18, height 6' (1.829 m), weight 93.8 kg, SpO2 98 %. Body mass index is 28.05 kg/m. General: Well developed, well nourished, in no acute distress. Head: Normocephalic, atraumatic, sclera non-icteric, no xanthomas, nares are without discharge. Neck: No apparent masses Lungs: Normal respirations with no wheezes, no rhonchi, no rales , no crackles   Heart: Regular rate and rhythm, normal S1 S2, no murmur, no rub, no gallop, PMI is normal  size and placement, carotid upstroke normal without bruit, jugular venous pressure normal Abdomen: Soft, non-tender, non-distended with normoactive bowel sounds. No hepatosplenomegaly. Abdominal aorta is normal size without bruit Extremities: No edema, no clubbing, no cyanosis, no ulcers,  Peripheral: 2+ radial, 2+ femoral, 2+ dorsal pedal pulses Neuro: Alert and oriented. Moves all extremities spontaneously. Psych:  Responds to questions appropriately with a normal affect.   Intake/Output Summary (Last 24 hours) at 03/05/2019 0837 Last data filed at 03/05/2019 0710 Gross per 24 hour  Intake 1379.95 ml  Output 1600 ml  Net -220.05 ml    Inpatient Medications:  . amiodarone  400 mg Oral BID  . aspirin EC  81 mg Oral Daily  . atorvastatin  80 mg Oral q1800  . carvedilol  3.125 mg Oral BID WC  . enoxaparin (LOVENOX) injection  40 mg Subcutaneous Q24H  . furosemide  40 mg Intravenous BID  . influenza vac split quadrivalent PF  0.5 mL Intramuscular Tomorrow-1000  . lisinopril  5 mg Oral Daily  . nicotine  21 mg Transdermal Daily  . sodium chloride flush  3 mL Intravenous Q12H  . spironolactone  25 mg Oral Daily   Infusions:    Labs: Recent Labs    03/02/19 2347 03/03/19 0218 03/04/19 0445 03/05/19 0634  NA   < >  --  136 139  K   < >  --  3.2* 3.8  CL   < >  --  98 101  CO2   < >  --  29 29  GLUCOSE   < >  --  116* 117*  BUN   < >  --  21* 21*  CREATININE   < >  --  0.66 0.62  CALCIUM   < >  --  8.0* 8.4*  MG  --  1.9  --   --    < > =  values in this interval not displayed.   No results for input(s): AST, ALT, ALKPHOS, BILITOT, PROT, ALBUMIN in the last 72 hours. Recent Labs    03/04/19 0445 03/05/19 0634  WBC 10.5 10.1  HGB 12.2* 12.9*  HCT 37.1* 38.7*  MCV 86.7 86.0  PLT 197 241   No results for input(s): CKTOTAL, CKMB, TROPONINI in the last 72 hours. Invalid input(s): POCBNP No results for input(s): HGBA1C in the last 72 hours.   Weights: Filed Weights    03/03/19 2051 03/04/19 0423 03/05/19 0448  Weight: 96.2 kg 96.2 kg 93.8 kg     Radiology/Studies:  CT ANGIO CHEST PE W OR WO CONTRAST  Result Date: 02/28/2019 CLINICAL DATA:  Respiratory failure EXAM: CT ANGIOGRAPHY CHEST WITH CONTRAST TECHNIQUE: Multidetector CT imaging of the chest was performed using the standard protocol during bolus administration of intravenous contrast. Multiplanar CT image reconstructions and MIPs were obtained to evaluate the vascular anatomy. CONTRAST:  75mL OMNIPAQUE IOHEXOL 350 MG/ML SOLN COMPARISON:  None. FINDINGS: Cardiovascular: Contrast injection is sufficient to demonstrate satisfactory opacification of the pulmonary arteries to the segmental level. There is no pulmonary embolus. The main pulmonary artery is within normal limits for size. There is no CT evidence of acute right heart strain. Atherosclerotic changes are noted of the thoracic aorta. Heart size is enlarged. Coronary artery calcifications are noted. There is no significant pericardial effusion. There is reflux of contrast into the IVC consistent with some degree of cardiac dysfunction. Mediastinum/Nodes: --enlarged mediastinal and hilar lymph nodes are noted. --No axillary lymphadenopathy. --No supraclavicular lymphadenopathy. --Normal thyroid gland. --The esophagus is unremarkable Lungs/Pleura: Emphysematous changes are noted bilaterally. There is interlobular septal thickening and scattered primarily perihilar ground-glass airspace opacities. Small bilateral pleural effusions are noted, right greater than left. The trachea is unremarkable. Upper Abdomen: There is a questionable nodular surface of the liver. There is a small amount of ascites in the upper abdomen. Musculoskeletal: No chest wall abnormality. No acute or significant osseous findings. Review of the MIP images confirms the above findings. IMPRESSION: 1. No acute pulmonary embolism. 2. Cardiomegaly with small bilateral pleural effusions and findings  suggestive of pulmonary edema. A superimposed atypical infectious process is difficult to exclude. There is reflux of contrast in the IVC consistent with underlying cardiac dysfunction. 3. Probable reactive mediastinal and hilar adenopathy. 4. Small volume ascites in the upper abdomen. 5. Questionable nodular surface of the liver which may indicate underlying cirrhosis. Aortic Atherosclerosis (ICD10-I70.0) and Emphysema (ICD10-J43.9). Electronically Signed   By: Katherine Mantle M.D.   On: 02/28/2019 17:30   CARDIAC CATHETERIZATION  Result Date: 03/04/2019  Mid LAD lesion is 65% stenosed.  Mid Cx lesion is 100% stenosed.  Prox RCA to Mid RCA lesion is 30% stenosed.  59 year old male with hypertension hyperlipidemia tobacco abuse having shortness of breath and congestive heart failure with nonsustained ventricular tachycardia LV systolic dysfunction with anterior hypokinesis and inferior posterior lateral akinesis with ejection fraction of 25% Occlusion of mid circumflex after obtuse marginal 1 with codominant 65% proximal LAD stenosis Stress test showing inferior posterior lateral akinesis and infarct without evidence of ischemia Plan Increase medication management for severe dilated cardiomyopathy and LV systolic dysfunction including beta-blocker ACE inhibitor Aldactone High intensity cholesterol therapy for coronary artery disease Continue amiodarone for 6 sustained ventricular tachycardia Consultation for possible defibrillator placement due to sustained ventricular tachycardia in setting of cardiomyopathy without evidence of myocardial infarction   NM Myocar Multi W/Spect W/Wall Motion / EF  Result Date: 03/02/2019  Blood pressure demonstrated a normal response to exercise.  There was no ST segment deviation noted during stress.  Defect 1: There is a medium defect of moderate severity present in the basal inferior, basal inferolateral, mid inferior and mid inferolateral location.  Findings  consistent with prior myocardial infarction with peri-infarct ischemia.  This is an intermediate risk study.  The left ventricular ejection fraction is moderately decreased (30-44%).    DG Chest Port 1 View  Result Date: 03/02/2019 CLINICAL DATA:  Chest pain EXAM: PORTABLE CHEST 1 VIEW COMPARISON:  Radiograph and CT 02/28/2019 FINDINGS: Increasingly confluent areas of mixed airspace and reticular opacity throughout both lungs most pronounced in the left mid lung and lung bases. Small bilateral effusions are present, right slightly greater than left. No pneumothorax. The cardiomediastinal contours are stable from prior. No acute osseous or soft tissue abnormality. Degenerative changes are present in the imaged spine and shoulders. Telemetry leads overlie the chest. IMPRESSION: Increasingly confluent mixed interstitial and airspace disease throughout both lungs could reflect edema or atypical infection. Electronically Signed   By: Kreg Shropshire M.D.   On: 03/02/2019 23:54   DG Chest Portable 1 View  Result Date: 02/28/2019 CLINICAL DATA:  59 year old male with a history of chest pain and shortness of breath with V-tach EXAM: PORTABLE CHEST 1 VIEW COMPARISON:  None. FINDINGS: Cardiomediastinal silhouette borderline enlarged. Low lung volumes. Reticular opacities throughout the lungs. No confluent airspace disease. No pneumothorax. No large pleural effusion. Defibrillator pads on the low chest. Degenerative changes of the spine and bilateral shoulders IMPRESSION: Reticular opacity of the lungs may reflect early pulmonary edema and/or atypical infection. Defibrillator pads on the low chest wall. Electronically Signed   By: Gilmer Mor D.O.   On: 02/28/2019 11:02   ECHOCARDIOGRAM COMPLETE  Result Date: 03/01/2019   ECHOCARDIOGRAM REPORT   Patient Name:   Brent [name] Date of Exam: 03/01/2019 Medical Rec #:  [id number]  Height:       72.0 in Accession #:    [id number] Weight:       210.5 lb Date of Birth:   05-25-1960 BSA:          2.18 m Patient Age:    58 years   BP:           123/90 mmHg Patient Gender: M          HR:           78 bpm. Exam Location:  [facility] Procedure: 2D Echo, Cardiac Doppler and Color Doppler Indications:     Abnormal ECG 794.31  History:         Patient has no prior history of Echocardiogram examinations.                  COPD; Risk Factors:Hypertension.  Sonographer:     Neysa Bonito Roar Referring Phys:  [id number] Eliezer Mccoy PATEL Diagnosing Phys: Julien Nordmann MD IMPRESSIONS  1. Left ventricular ejection fraction, by visual estimation, is 25 to 30%. The left ventricle has severely decreased function. There is mildly increased left ventricular hypertrophy.  2. Mildly dilated left ventricular internal cavity size.  3. The left ventricle demonstrates global hypokinesis.  4. Global right ventricle has low normal systolic function.The right ventricular size is normal. No increase in right ventricular wall thickness.  5. Left atrial size was moderately dilated.  6. The pulmonic valve was normal in structure. Pulmonic valve regurgitation is not visualized.  7. Moderately elevated pulmonary artery systolic pressure.  8. The tricuspid  regurgitant velocity is 3.29 m/s, and with an assumed right atrial pressure of 10 mmHg, the estimated right ventricular systolic pressure is moderately elevated at 53.3 mmHg. FINDINGS  Left Ventricle: Left ventricular ejection fraction, by visual estimation, is 25 to 30%. The left ventricle has severely decreased function. The left ventricle demonstrates global hypokinesis. The left ventricular internal cavity size was mildly dilated left ventricle. There is mildly increased left ventricular hypertrophy. Normal left atrial pressure. Right Ventricle: The right ventricular size is normal. No increase in right ventricular wall thickness. Global RV systolic function is has low normal systolic function. The tricuspid regurgitant velocity is 3.29 m/s, and with an assumed right atrial  pressure of 10 mmHg, the estimated right ventricular systolic pressure is moderately elevated at 53.3 mmHg. Left Atrium: Left atrial size was moderately dilated. Right Atrium: Right atrial size was normal in size Pericardium: There is no evidence of pericardial effusion. Mitral Valve: The mitral valve is normal in structure. No evidence of mitral valve regurgitation. Moderate mitral valve stenosis by observation. Tricuspid Valve: The tricuspid valve is normal in structure. Tricuspid valve regurgitation is mild. Aortic Valve: The aortic valve is normal in structure. Aortic valve regurgitation is not visualized. The aortic valve is structurally normal, with no evidence of sclerosis or stenosis. Aortic valve mean gradient measures 3.0 mmHg. Aortic valve peak gradient measures 5.6 mmHg. Aortic valve area, by VTI measures 2.28 cm. Pulmonic Valve: The pulmonic valve was normal in structure. Pulmonic valve regurgitation is not visualized. Pulmonic regurgitation is not visualized. Aorta: The aortic root, ascending aorta and aortic arch are all structurally normal, with no evidence of dilitation or obstruction. Venous: The inferior vena cava is normal in size with greater than 50% respiratory variability, suggesting right atrial pressure of 3 mmHg. IAS/Shunts: No atrial level shunt detected by color flow Doppler. There is no evidence of a patent foramen ovale. No ventricular septal defect is seen or detected. There is no evidence of an atrial septal defect.  LEFT VENTRICLE PLAX 2D LVIDd:         6.07 cm       Diastology LVIDs:         5.35 cm       LV e' lateral:   4.79 cm/s LV PW:         1.14 cm       LV E/e' lateral: 21.5 LV IVS:        1.33 cm       LV e' medial:    6.74 cm/s LVOT diam:     2.10 cm       LV E/e' medial:  15.3 LV SV:         47 ml LV SV Index:   20.95 LVOT Area:     3.46 cm  LV Volumes (MOD) LV area d, A2C:    48.40 cm LV area d, A4C:    42.10 cm LV area s, A2C:    38.10 cm LV area s, A4C:    33.80  cm LV major d, A2C:   8.92 cm LV major d, A4C:   8.15 cm LV major s, A2C:   8.19 cm LV major s, A4C:   7.35 cm LV vol d, MOD A2C: 217.0 ml LV vol d, MOD A4C: 176.0 ml LV vol s, MOD A2C: 149.0 ml LV vol s, MOD A4C: 127.0 ml LV SV MOD A2C:     68.0 ml LV SV MOD A4C:     176.0 ml  LV SV MOD BP:      59.2 ml RIGHT VENTRICLE RV Mid diam:    3.50 cm RV S prime:     13.40 cm/s TAPSE (M-mode): 1.8 cm LEFT ATRIUM              Index       RIGHT ATRIUM           Index LA diam:        5.20 cm  2.39 cm/m  RA Area:     17.10 cm LA Vol (A2C):   109.0 ml 50.06 ml/m RA Volume:   49.50 ml  22.73 ml/m LA Vol (A4C):   93.8 ml  43.08 ml/m LA Biplane Vol: 105.0 ml 48.22 ml/m  AORTIC VALVE                   PULMONIC VALVE AV Area (Vmax):    2.11 cm    PV Vmax:        0.83 m/s AV Area (Vmean):   2.27 cm    PV Peak grad:   2.7 mmHg AV Area (VTI):     2.28 cm    RVOT Peak grad: 1 mmHg AV Vmax:           118.00 cm/s AV Vmean:          80.400 cm/s AV VTI:            0.158 m AV Peak Grad:      5.6 mmHg AV Mean Grad:      3.0 mmHg LVOT Vmax:         71.80 cm/s LVOT Vmean:        52.700 cm/s LVOT VTI:          0.104 m LVOT/AV VTI ratio: 0.66  AORTA Ao Root diam: 3.40 cm MITRAL VALVE                        TRICUSPID VALVE MV Area (PHT): 3.89 cm             TR Peak grad:   43.3 mmHg MV PHT:        56.55 msec           TR Vmax:        332.00 cm/s MV Decel Time: 195 msec MV E velocity: 103.00 cm/s 103 cm/s SHUNTS                                     Systemic VTI:  0.10 m                                     Systemic Diam: 2.10 cm  Julien Nordmann MD Electronically signed by Julien Nordmann MD Signature Date/Time: 03/01/2019/1:08:54 PM    Final      Assessment and Recommendation  59 y.o. male with known essential hypertension mixed hyperlipidemia tobacco abuse with acute congestive heart failure with minimal appreciation of troponin consistent with demand ischemia and monomorphic ventricular tachycardia now improved with amiodarone and  moderate atherosclerosis of coronary arteries with no evidence of need for intervention 1.  Patient is to increase medication management for cardiomyopathy and congestive heart failure adding carvedilol to lisinopril and adding spironolactone yesterday and tolerating well 2.  No further cardiac intervention at this time  due to completely occluded circumflex artery with no collateralization and noncritical left anterior descending artery atherosclerosis 3.  Increase Lipitor to 80 mg for high intensity cholesterol therapy 4.  Continue Lasix for pulmonary edema and congestive heart failure without change 5.  Begin ambulation and follow-up for improvements of above 6.  Long discussion to the patient of possible defibrillator placement for further treatment of monomorphic ventricular tachycardia from dilated cardiomyopathy and patient and family wish to further intervene at this time and will discussed the possibility of transfer to outside facility for intervention  Signed, Arnoldo Hooker M.D. FACC

## 2019-03-06 MED ORDER — OXYCODONE HCL 5 MG PO TABS
15.00 | ORAL_TABLET | ORAL | Status: DC
Start: ? — End: 2019-03-06

## 2019-03-06 MED ORDER — ASPIRIN 81 MG PO TBEC
81.00 | DELAYED_RELEASE_TABLET | ORAL | Status: DC
Start: 2019-03-06 — End: 2019-03-06

## 2019-03-06 MED ORDER — LIDOCAINE HCL 1 % IJ SOLN
0.50 | INTRAMUSCULAR | Status: DC
Start: ? — End: 2019-03-06

## 2019-03-06 MED ORDER — ACETAMINOPHEN 325 MG PO TABS
650.00 | ORAL_TABLET | ORAL | Status: DC
Start: ? — End: 2019-03-06

## 2019-03-06 MED ORDER — LISINOPRIL 5 MG PO TABS
5.00 | ORAL_TABLET | ORAL | Status: DC
Start: 2019-03-06 — End: 2019-03-06

## 2019-03-06 MED ORDER — GABAPENTIN 300 MG PO CAPS
300.00 | ORAL_CAPSULE | ORAL | Status: DC
Start: 2019-03-06 — End: 2019-03-06

## 2019-03-06 MED ORDER — ATORVASTATIN CALCIUM 80 MG PO TABS
80.00 | ORAL_TABLET | ORAL | Status: DC
Start: 2019-03-06 — End: 2019-03-06

## 2019-03-06 MED ORDER — SPIRONOLACTONE 25 MG PO TABS
25.00 | ORAL_TABLET | ORAL | Status: DC
Start: 2019-03-06 — End: 2019-03-06

## 2019-03-06 MED ORDER — AMIODARONE HCL 200 MG PO TABS
400.00 | ORAL_TABLET | ORAL | Status: DC
Start: 2019-03-06 — End: 2019-03-06

## 2019-03-06 MED ORDER — CYCLOBENZAPRINE HCL 10 MG PO TABS
5.00 | ORAL_TABLET | ORAL | Status: DC
Start: ? — End: 2019-03-06

## 2019-03-06 MED ORDER — NICOTINE 21 MG/24HR TD PT24
1.00 | MEDICATED_PATCH | TRANSDERMAL | Status: DC
Start: 2019-03-06 — End: 2019-03-06

## 2019-03-06 MED ORDER — CARVEDILOL 3.125 MG PO TABS
3.13 | ORAL_TABLET | ORAL | Status: DC
Start: 2019-03-06 — End: 2019-03-06

## 2019-03-10 NOTE — Progress Notes (Deleted)
   Patient ID: Brent [name], male    DOB: March 29, 1960, 59 y.o.   MRN: [id number]  HPI  Brent [name] is a 59 y/o male with a history of  Echo report from 03/01/19 reviewed and showed an EF of 25-30% along with moderately elevated PA pressure.   LHC done 03/04/19 showed:  Mid LAD lesion is 65% stenosed.  Mid Cx lesion is 100% stenosed.  Prox RCA to Mid RCA lesion is 30% stenosed.  Admitted 03/05/19 due to needing ICD placement. Cardiology consult obtained.  [employer] ICD placed 03/06/19. No VT episodes. Discharged after 2 days. Admitted 03/02/19 due to acute on chronic heart failure. Cardiology consult obtained. Echo and LHC done. Initially given IV lasix with resultant loss of 4.0L with transition to oral diuretics. Transferred to [facility] after 3 days. Admitted 02/28/19 due to shortness of breath and chest pain. Cardiology consult obtained. Initially started on amiodarone drip with transition to oral med. Patient was on 8L oxygen and then took it off prior to leaving. Initially given IV lasix and then transitioned to oral diuretics. Left AMA after 2 days.   Review of Systems    Physical Exam  Assessment & Plan:  1: Chronic heart failure with reduced ejection fraction-  - NYHA class - BNP 03/02/19 was 990.0  2: HTN- - BP - BMP 03/07/19 reviewed and showed sodium 135, potassium 3.7, creatinine 0.8 and GFR 99  3: VT- - ICD place 03/06/19  4: Tobacco use-

## 2019-03-11 ENCOUNTER — Ambulatory Visit: Payer: Medicaid Other | Admitting: Family

## 2019-03-11 ENCOUNTER — Telehealth: Payer: Self-pay | Admitting: Family

## 2019-03-11 NOTE — Telephone Encounter (Signed)
Called Brent [name] on 2/2 and 2/3 to follow up with him since his recent hospital stay where he was then transferred to [facility]. We had scheduled him a new patient appointment for today( Feb 3rd) and he was a no show.   Deetta Perla, [place]

## 2019-05-27 NOTE — Progress Notes (Signed)
n  Patient ID: Brent [name], male    DOB: 1960-12-30, 59 y.o.   MRN: [id number]  HPI  Mr Nyquist is a 59 y/o male with a history of hyperlipidemia, HTN, COPD, chronic pain, hepatitis C, depression, spinal stenosis, subarachnoid hemorrhage, current tobacco use and chronic heart failure.   Echo report from 03/01/19 reviewed and showed an EF of 25-30% along with moderately elevated PA pressure.   Catheterization report from 03/04/19 showed:  Mid LAD lesion is 65% stenosed.  Mid Cx lesion is 100% stenosed.  Prox RCA to Mid RCA lesion is 30% stenosed.  Admitted 03/05/19 due to palpitations/ chest pain at outside hospital. Transferred. Initially received IV lasix and then transitioned to oral diuretics. Needed oxygen at outside hospital but now on room air. ICD implanted. Discharged after 3 days. Admitted 03/02/19 due to chest pain/ shortness of breath. Was in [place] and was shocked. Initially given IV lasix and then transitioned to oral diuretics. Cardiology consult obtained. Transferred to [facility] after 3 days. Admitted 02/28/19 due to chest pain. Cardiology consult obtained. Amiodarone drip started & then transitioned to oral medication. Was on IV heparin when he pulled out his IV. Given IV lasix. Patient left AMA.   Patient presents today for his initial visit by recommendation of his cardiologist. He has a chief complaint of moderate fatigue with little exertion. He describes this as chronic in nature having been present for several years with varying levels of severity. He has associated dizziness, numbness/weakness in his left leg, chronic back pain, depression and difficulty sleeping along with this. He denies any abdominal distention, palpitations, pedal edema, chest pain, cough or weight gain.   He says that his defibrillator has shocked him once for sure in the last week and he thinks it may have shocked him a second time but he was asleep at the time. He didn't call his cardiologist as he thought it was  being monitored.   Past Medical History:  Diagnosis Date  . CHF (congestive heart failure) ([place])   . COPD (chronic obstructive pulmonary disease) ([place])   . Hypertension    Past Surgical History:  Procedure Laterality Date  . LEFT HEART CATH AND CORONARY ANGIOGRAPHY N/A 03/04/2019   Procedure: LEFT HEART CATH AND CORONARY ANGIOGRAPHY;  Surgeon: Corey Skains, MD;  Location: [facility];  Service: Cardiovascular;  Laterality: N/A;   No family history on file. Social History   Tobacco Use  . Smoking status: Current Every Day Smoker    Packs/day: 2.00    Types: Cigarettes  . Smokeless tobacco: Never Used  Substance Use Topics  . Alcohol use: Not Currently   No Known Allergies Prior to Admission medications   Medication Sig Start Date End Date Taking? Authorizing Provider  amiodarone (PACERONE) 400 MG tablet Take 1 tablet (400 mg total) by mouth 2 (two) times daily. Patient taking differently: Take 400 mg by mouth daily.  03/05/19  Yes Danford, Suann Larry, MD  aspirin EC 81 MG EC tablet Take 1 tablet (81 mg total) by mouth daily. 03/06/19  Yes Danford, Suann Larry, MD  atorvastatin (LIPITOR) 80 MG tablet Take 1 tablet (80 mg total) by mouth daily at 6 PM. 03/05/19  Yes Danford, Suann Larry, MD  carvedilol (COREG) 3.125 MG tablet Take 1 tablet (3.125 mg total) by mouth 2 (two) times daily with a meal. 03/05/19  Yes Danford, Suann Larry, MD  lisinopril (ZESTRIL) 5 MG tablet Take 1 tablet (5 mg total) by mouth daily. 03/06/19  Yes Danford, Earl Lites, MD  naproxen sodium (ALEVE) 220 MG tablet Take 220 mg by mouth daily as needed.   Yes [provider]  nicotine (NICODERM CQ - DOSED IN MG/24 HOURS) 21 mg/24hr patch Place 1 patch (21 mg total) onto the skin daily. 03/05/19  Yes Danford, Earl Lites, MD  oxyCODONE (ROXICODONE) 15 MG immediate release tablet Take 1 tablet (15 mg total) by mouth every 4 (four) hours as needed for moderate pain. 03/05/19  Yes Danford,  Earl Lites, MD  spironolactone (ALDACTONE) 25 MG tablet Take 1 tablet (25 mg total) by mouth daily. 03/06/19  Yes Danford, Earl Lites, MD  nitroGLYCERIN (NITROSTAT) 0.4 MG SL tablet Place 1 tablet (0.4 mg total) under the tongue every 5 (five) minutes x 3 doses as needed for chest pain. Patient not taking: Reported on 05/28/2019 03/05/19   Alberteen Sam, MD     Review of Systems  Constitutional: Positive for fatigue (tire easily). Negative for appetite change.  HENT: Negative for congestion, postnasal drip and sore throat.   Eyes: Negative.   Respiratory: Positive for shortness of breath and wheezing. Negative for cough.   Cardiovascular: Negative for chest pain, palpitations and leg swelling.  Gastrointestinal: Negative for abdominal distention and abdominal pain.  Endocrine: Negative.   Genitourinary: Negative.   Musculoskeletal: Positive for arthralgias (left shoulder pain) and back pain.  Skin: Negative.   Allergic/Immunologic: Negative.   Neurological: Positive for dizziness, weakness (left lower leg) and numbness (left lower leg). Negative for light-headedness.  Hematological: Negative for adenopathy. Does not bruise/bleed easily.  Psychiatric/Behavioral: Positive for dysphoric mood and sleep disturbance (sleeping on 2 pillows). The patient is not nervous/anxious.    Vitals:   05/28/19 1026  BP: 134/87  Pulse: 68  Resp: 18  SpO2: 100%  Weight: 203 lb 4 oz (92.2 kg)  Height: 6' (1.829 m)   Wt Readings from Last 3 Encounters:  05/28/19 203 lb 4 oz (92.2 kg)  03/05/19 206 lb 12.8 oz (93.8 kg)  03/02/19 215 lb 13.3 oz (97.9 kg)   Lab Results  Component Value Date   CREATININE 0.62 03/05/2019   CREATININE 0.66 03/04/2019   CREATININE 0.77 03/02/2019    Physical Exam Vitals and nursing note reviewed.  Constitutional:      Appearance: He is well-developed.  HENT:     Head: Normocephalic and atraumatic.  Neck:     Vascular: No JVD.  Cardiovascular:      Rate and Rhythm: Normal rate and regular rhythm.  Pulmonary:     Effort: Pulmonary effort is normal. No respiratory distress.     Breath sounds: Examination of the right-upper field reveals rhonchi. Examination of the left-upper field reveals rhonchi. Examination of the right-lower field reveals rhonchi. Examination of the left-lower field reveals rhonchi. Rhonchi present. No wheezing or rales.  Abdominal:     Palpations: Abdomen is soft.     Tenderness: There is no abdominal tenderness.  Musculoskeletal:     Cervical back: Normal range of motion and neck supple.     Right lower leg: No tenderness. No edema.     Left lower leg: No tenderness. No edema.  Skin:    General: Skin is warm and dry.  Neurological:     General: No focal deficit present.     Mental Status: He is alert and oriented to person, place, and time.  Psychiatric:        Mood and Affect: Mood normal.  Behavior: Behavior normal.     Assessment & Plan:  1: Chronic heart failure with reduced ejection fraction- - NYHA class III - euvolemic today - weighing daily; instructed to call for an overnight weight gain of >2 pounds or a weekly weight gain of >5 pounds - not adding salt and they are reviewing food labels for sodium content; reviewed the importance of closely following a low sodium diet and written information and a low sodium cookbook were given to him - saw cardiology (Paraschos) 05/20/19 & returns 06/30/19 - will stop his lisinopril and begin entresto 24/26mg  BID; voucher was provided - instructed patient to not take anymore lisinopril (took it this morning) and take 1 dose of entresto tomorrow evening and then begin entresto twice daily on 05/30/19 - will check BMP at his next visit - BNP 03/02/19 was 990.0 - PharmD reconciled medications with the patient  2: HTN- - BP looks good today - girlfriend that is present with him says that she's going to call around and see about getting him established with  primary care - BMP 05/20/19 reviewed and showed sodium 137, potassium 4.9, creatinine 0.8 and GFR 100  3: COPD/ tobacco use-  - currently using nicotine patch as well as smoking 1 ppd of cigarettes and has been feeling dizzy - explained that he shouldn't be doing both as the whole point is to reduce nicotine consumption - he says that he wants to quit "a little bit" and is willing to try the nicotine inhaler so that was called in for him; he is not to smoke while using the inhaler  4: [place]- - ICD placed January 2021 - patient says that he's been shocked 1-2 times in the last week but he didn't call cardiology because he thought it was being monitored; 1 time for certain and 2nd time he woke from his sleep so he's unsure of what happened - cardiology (Paraschos) office called and message left as well as secure chat sent to provider - magnesium level drawn today - explained that he needed to call cardiologist after he gets shocked   Medication list was reviewed.   Return in 3 weeks or sooner for any questions/problems before then.

## 2019-05-28 ENCOUNTER — Other Ambulatory Visit: Payer: Self-pay

## 2019-05-28 ENCOUNTER — Ambulatory Visit: Payer: Medicaid Other | Attending: Family | Admitting: Family

## 2019-05-28 ENCOUNTER — Encounter: Payer: Self-pay | Admitting: Family

## 2019-05-28 VITALS — BP 134/87 | HR 68 | Resp 18 | Ht 72.0 in | Wt 203.2 lb

## 2019-05-28 DIAGNOSIS — I110 Hypertensive heart disease with heart failure: Secondary | ICD-10-CM | POA: Diagnosis not present

## 2019-05-28 DIAGNOSIS — Z7982 Long term (current) use of aspirin: Secondary | ICD-10-CM | POA: Insufficient documentation

## 2019-05-28 DIAGNOSIS — Z8619 Personal history of other infectious and parasitic diseases: Secondary | ICD-10-CM | POA: Insufficient documentation

## 2019-05-28 DIAGNOSIS — I472 Ventricular tachycardia, unspecified: Secondary | ICD-10-CM

## 2019-05-28 DIAGNOSIS — Z95810 Presence of automatic (implantable) cardiac defibrillator: Secondary | ICD-10-CM | POA: Insufficient documentation

## 2019-05-28 DIAGNOSIS — F17210 Nicotine dependence, cigarettes, uncomplicated: Secondary | ICD-10-CM | POA: Insufficient documentation

## 2019-05-28 DIAGNOSIS — R42 Dizziness and giddiness: Secondary | ICD-10-CM | POA: Insufficient documentation

## 2019-05-28 DIAGNOSIS — E785 Hyperlipidemia, unspecified: Secondary | ICD-10-CM | POA: Diagnosis not present

## 2019-05-28 DIAGNOSIS — I5022 Chronic systolic (congestive) heart failure: Secondary | ICD-10-CM | POA: Diagnosis not present

## 2019-05-28 DIAGNOSIS — Z79899 Other long term (current) drug therapy: Secondary | ICD-10-CM | POA: Insufficient documentation

## 2019-05-28 DIAGNOSIS — J449 Chronic obstructive pulmonary disease, unspecified: Secondary | ICD-10-CM | POA: Diagnosis not present

## 2019-05-28 DIAGNOSIS — I10 Essential (primary) hypertension: Secondary | ICD-10-CM

## 2019-05-28 LAB — MAGNESIUM: Magnesium: 2.2 mg/dL (ref 1.7–2.4)

## 2019-05-28 MED ORDER — SACUBITRIL-VALSARTAN 24-26 MG PO TABS: 1.0000 | ORAL_TABLET | Freq: Two times a day (BID) | ORAL | 3 refills | Status: DC

## 2019-05-28 NOTE — Patient Instructions (Addendum)
Continue weighing daily and call for an overnight weight gain of > 2 pounds or a weekly weight gain of >5 pounds.   Stop taking lisinopril. Take 1 entresto tablet tomorrow night and then beginning on Saturday, you will start taking entresto twice daily every day

## 2019-06-15 NOTE — Progress Notes (Signed)
Patient ID: Brent [name], male    DOB: 12-22-1960, 59 y.o.   MRN: [id number]  HPI  Brent [name] is a 59 y/o male with a history of hyperlipidemia, HTN, COPD, chronic pain, hepatitis C, depression, spinal stenosis, subarachnoid hemorrhage, current tobacco use and chronic heart failure.   Echo report from 03/01/19 reviewed and showed an EF of 25-30% along with moderately elevated PA pressure.   Catheterization report from 03/04/19 showed:  Mid LAD lesion is 65% stenosed.  Mid Cx lesion is 100% stenosed.  Prox RCA to Mid RCA lesion is 30% stenosed.  Admitted 03/05/19 due to palpitations/ chest pain at outside hospital. Transferred. Initially received IV lasix and then transitioned to oral diuretics. Needed oxygen at outside hospital but now on room air. ICD implanted. Discharged after 3 days. Admitted 03/02/19 due to chest pain/ shortness of breath. Was in [place] and was shocked. Initially given IV lasix and then transitioned to oral diuretics. Cardiology consult obtained. Transferred to [facility] after 3 days. Admitted 02/28/19 due to chest pain. Cardiology consult obtained. Amiodarone drip started & then transitioned to oral medication. Was on IV heparin when he pulled out his IV. Given IV lasix. Patient left AMA.   Patient presents today for a follow-up visit with a chief complaint of moderate fatigue upon minimal exertion. He describes this as chronic in nature having been present for several years. He has associated shortness of breath, wheezing, dizziness, left leg numbness/weakness, depression and chronic difficulty sleeping along with this. He denies any abdominal distention, palpitations, pedal edema, chest pain, cough or weight gain.   Says that when he first taking entresto, he felt more dizzy than normal so he decreased the dosage to 1/2 tablet BID for about 3-4 days. He's been taking the whole tablet BID since that time. Still gets dizzy with sudden position changes.   Past Medical History:  Diagnosis  Date  . CHF (congestive heart failure) ([facility])   . Chronic pain   . COPD (chronic obstructive pulmonary disease) ([facility])   . Depression   . Hepatitis C   . Hyperlipidemia   . Hypertension   . Spinal stenosis   . Subarachnoid hemorrhage [facility])    Past Surgical History:  Procedure Laterality Date  . LEFT HEART CATH AND CORONARY ANGIOGRAPHY N/A 03/04/2019   Procedure: LEFT HEART CATH AND CORONARY ANGIOGRAPHY;  Surgeon: Lamar Blinks, MD;  Location: [facility];  Service: Cardiovascular;  Laterality: N/A;   No family history on file. Social History   Tobacco Use  . Smoking status: Current Every Day Smoker    Packs/day: 2.00    Types: Cigarettes  . Smokeless tobacco: Never Used  Substance Use Topics  . Alcohol use: Not Currently   No Active Allergies  Prior to Admission medications   Medication Sig Start Date End Date Taking? Authorizing Provider  amiodarone (PACERONE) 400 MG tablet Take 1 tablet (400 mg total) by mouth 2 (two) times daily. Patient taking differently: Take 400 mg by mouth daily.  03/05/19  Yes Danford, Earl Lites, MD  aspirin EC 81 MG EC tablet Take 1 tablet (81 mg total) by mouth daily. 03/06/19  Yes Danford, Earl Lites, MD  atorvastatin (LIPITOR) 80 MG tablet Take 1 tablet (80 mg total) by mouth daily at 6 PM. 03/05/19  Yes Danford, Earl Lites, MD  budesonide-formoterol (SYMBICORT) 160-4.5 MCG/ACT inhaler Inhale 2 puffs into the lungs 2 (two) times daily. 06/05/19 06/04/20 Yes [provider]  buPROPion (WELLBUTRIN XL) 150 MG 24  hr tablet Take 150 mg by mouth daily. 06/05/19 06/04/20 Yes [provider]  carvedilol (COREG) 3.125 MG tablet Take 1 tablet (3.125 mg total) by mouth 2 (two) times daily with a meal. 03/05/19  Yes Danford, Earl Lites, MD  cyclobenzaprine (FLEXERIL) 10 MG tablet Take 5-10 mg by mouth 2 (two) times daily.   Yes [provider]  gabapentin (NEURONTIN) 300 MG capsule Take 300 mg by mouth 2 (two) times  daily.   Yes [provider]  naproxen sodium (ALEVE) 220 MG tablet Take 220 mg by mouth daily as needed.   Yes [provider]  nicotine (NICODERM CQ - DOSED IN MG/24 HOURS) 21 mg/24hr patch Place 1 patch (21 mg total) onto the skin daily. 03/05/19  Yes Danford, Earl Lites, MD  oxyCODONE (ROXICODONE) 15 MG immediate release tablet Take 1 tablet (15 mg total) by mouth every 4 (four) hours as needed for moderate pain. 03/05/19  Yes Danford, Earl Lites, MD  sacubitril-valsartan (ENTRESTO) 24-26 MG Take 1 tablet by mouth 2 (two) times daily. 05/28/19  Yes Clarisa Kindred A, FNP  spironolactone (ALDACTONE) 25 MG tablet Take 1 tablet (25 mg total) by mouth daily. 03/06/19  Yes Danford, Earl Lites, MD  albuterol (VENTOLIN HFA) 108 (90 Base) MCG/ACT inhaler Inhale 2 puffs into the lungs every 6 (six) hours as needed. 06/05/19 06/04/20  [provider]  Nicotine Polacrilex (NICORETTE MT) Use as directed in the mouth or throat.    [provider]  nitroGLYCERIN (NITROSTAT) 0.4 MG SL tablet Place 1 tablet (0.4 mg total) under the tongue every 5 (five) minutes x 3 doses as needed for chest pain. Patient not taking: Reported on 05/28/2019 03/05/19   Alberteen Sam, MD     Review of Systems  Constitutional: Positive for fatigue (tire easily). Negative for appetite change.  HENT: Negative for congestion, postnasal drip and sore throat.   Eyes: Negative.   Respiratory: Positive for shortness of breath and wheezing. Negative for cough.   Cardiovascular: Negative for chest pain, palpitations and leg swelling.  Gastrointestinal: Negative for abdominal distention and abdominal pain.  Endocrine: Negative.   Genitourinary: Negative.   Musculoskeletal: Positive for arthralgias (left shoulder pain) and back pain.  Skin: Negative.   Allergic/Immunologic: Negative.   Neurological: Positive for dizziness (when standing up), weakness (left lower leg) and numbness (left lower  leg). Negative for light-headedness.  Hematological: Negative for adenopathy. Does not bruise/bleed easily.  Psychiatric/Behavioral: Positive for dysphoric mood and sleep disturbance (sleeping on 2 pillows). The patient is not nervous/anxious.    Vitals:   06/16/19 0938  BP: 122/85  Pulse: 71  Resp: 20  SpO2: 94%  Weight: 204 lb (92.5 kg)  Height: 6' (1.829 m)   Wt Readings from Last 3 Encounters:  06/16/19 204 lb (92.5 kg)  05/28/19 203 lb 4 oz (92.2 kg)  03/05/19 206 lb 12.8 oz (93.8 kg)   Lab Results  Component Value Date   CREATININE 0.62 03/05/2019   CREATININE 0.66 03/04/2019   CREATININE 0.77 03/02/2019     Physical Exam Vitals and nursing note reviewed.  Constitutional:      Appearance: He is well-developed.  HENT:     Head: Normocephalic and atraumatic.  Neck:     Vascular: No JVD.  Cardiovascular:     Rate and Rhythm: Normal rate and regular rhythm.  Pulmonary:     Effort: Pulmonary effort is normal. No respiratory distress.     Breath sounds: No wheezing, rhonchi or  rales.  Abdominal:     Palpations: Abdomen is soft.     Tenderness: There is no abdominal tenderness.  Musculoskeletal:     Cervical back: Normal range of motion and neck supple.     Right lower leg: No tenderness. No edema.     Left lower leg: No tenderness. No edema.  Skin:    General: Skin is warm and dry.  Neurological:     General: No focal deficit present.     Mental Status: He is alert and oriented to person, place, and time.  Psychiatric:        Mood and Affect: Mood normal.        Behavior: Behavior normal.     Assessment & Plan:  1: Chronic heart failure with reduced ejection fraction- - NYHA class III - euvolemic today - weighing daily; reminded to call for an overnight weight gain of >2 pounds or a weekly weight gain of >5 pounds - weight stable from last visit here 3 weeks ago - not adding salt and they are reviewing food labels for sodium content; reviewed the  importance of closely following a low sodium diet  - saw cardiology (Paraschos) 06/03/19 - entresto 24/26mg  BID started at his last visit; will continue on this dose for another 2 weeks since he had a worsening of dizziness after starting it; plan to uptitrate it at his next visit - will check BMP today - BNP 03/02/19 was 990.0 - PharmD reconciled medications with the patient - has received both his COVID vaccines   2: HTN- - BP looks good today - saw PCP ([name]) 06/05/19 (waiting on liver specialist referral) - BMP 05/20/19 reviewed and showed sodium 137, potassium 4.9, creatinine 0.8 and GFR 100  3: COPD/ tobacco use-  - currently using nicotine patch as well as smoking 1 ppd of cigarettes  - complete cessation discussed for 3 minutes with him; used to smoke 2 ppd  4: [place]- - ICD placed January 2021 - ICD recently interrogated without any shocks noted - has device at home to send in transmissions but he's not sure if that's what he's supposed to do or not; advised him to call cardiology office and ask them what the process is   Medication list was reviewed.   Return in 2 weeks or sooner for any questions/problems before then.

## 2019-06-16 ENCOUNTER — Other Ambulatory Visit: Payer: Self-pay

## 2019-06-16 ENCOUNTER — Ambulatory Visit: Payer: Medicaid Other | Attending: Family | Admitting: Family

## 2019-06-16 ENCOUNTER — Encounter: Payer: Self-pay | Admitting: Family

## 2019-06-16 VITALS — BP 122/85 | HR 71 | Resp 20 | Ht 72.0 in | Wt 204.0 lb

## 2019-06-16 DIAGNOSIS — E785 Hyperlipidemia, unspecified: Secondary | ICD-10-CM | POA: Insufficient documentation

## 2019-06-16 DIAGNOSIS — Z79899 Other long term (current) drug therapy: Secondary | ICD-10-CM | POA: Diagnosis not present

## 2019-06-16 DIAGNOSIS — J449 Chronic obstructive pulmonary disease, unspecified: Secondary | ICD-10-CM | POA: Diagnosis not present

## 2019-06-16 DIAGNOSIS — R0602 Shortness of breath: Secondary | ICD-10-CM | POA: Insufficient documentation

## 2019-06-16 DIAGNOSIS — R531 Weakness: Secondary | ICD-10-CM | POA: Diagnosis not present

## 2019-06-16 DIAGNOSIS — R200 Anesthesia of skin: Secondary | ICD-10-CM | POA: Diagnosis not present

## 2019-06-16 DIAGNOSIS — R5383 Other fatigue: Secondary | ICD-10-CM | POA: Insufficient documentation

## 2019-06-16 DIAGNOSIS — F329 Major depressive disorder, single episode, unspecified: Secondary | ICD-10-CM | POA: Diagnosis not present

## 2019-06-16 DIAGNOSIS — Z7982 Long term (current) use of aspirin: Secondary | ICD-10-CM | POA: Insufficient documentation

## 2019-06-16 DIAGNOSIS — Z8619 Personal history of other infectious and parasitic diseases: Secondary | ICD-10-CM | POA: Insufficient documentation

## 2019-06-16 DIAGNOSIS — I110 Hypertensive heart disease with heart failure: Secondary | ICD-10-CM | POA: Diagnosis not present

## 2019-06-16 DIAGNOSIS — I472 Ventricular tachycardia, unspecified: Secondary | ICD-10-CM

## 2019-06-16 DIAGNOSIS — I5022 Chronic systolic (congestive) heart failure: Secondary | ICD-10-CM | POA: Diagnosis not present

## 2019-06-16 DIAGNOSIS — G479 Sleep disorder, unspecified: Secondary | ICD-10-CM | POA: Diagnosis not present

## 2019-06-16 DIAGNOSIS — Z95810 Presence of automatic (implantable) cardiac defibrillator: Secondary | ICD-10-CM | POA: Diagnosis not present

## 2019-06-16 DIAGNOSIS — F17210 Nicotine dependence, cigarettes, uncomplicated: Secondary | ICD-10-CM | POA: Insufficient documentation

## 2019-06-16 DIAGNOSIS — I10 Essential (primary) hypertension: Secondary | ICD-10-CM

## 2019-06-16 LAB — BASIC METABOLIC PANEL
Anion gap: 9 (ref 5–15)
BUN: 19 mg/dL (ref 6–20)
CO2: 28 mmol/L (ref 22–32)
Calcium: 8.9 mg/dL (ref 8.9–10.3)
Chloride: 103 mmol/L (ref 98–111)
Creatinine, Ser: 0.73 mg/dL (ref 0.61–1.24)
GFR calc Af Amer: >60 mL/min (ref >60)
GFR calc non Af Amer: >60 mL/min (ref >60)
Glucose, Bld: 92 mg/dL (ref 70–99)
Potassium: 4 mmol/L (ref 3.5–5.1)
Sodium: 140 mmol/L (ref 135–145)

## 2019-06-16 NOTE — Progress Notes (Signed)
[place] [facility] - PHARMACIST COUNSELING NOTE  ADHERENCE ASSESSMENT  Adherence strategy: Wife present at clinic visit today. She manages medications for patient at home. Utilizes a pillbox.    Do you ever forget to take your medication? [] Yes (1) [x] No (0)  Do you ever skip doses due to side effects? [x] Yes (1) [] No (0)  Do you have trouble affording your medicines? [] Yes (1) [x] No (0)  Are you ever unable to pick up your medication due to transportation difficulties? [] Yes (1) [x] No (0)  Do you ever stop taking your medications because you don't believe they are helping? [] Yes (1) [x] No (0)  Total score 1   Recommendations given to patient about increasing adherence: None needed  Guideline-Directed Medical Therapy/Evidence Based Medicine  ACE/ARB/ARNI: Entresto 24-26 mg BID Beta Blocker: Carvedilol 3.125 mg BID Aldosterone Antagonist: Spironolactone 25 mg daily Diuretic: None    SUBJECTIVE  HPI: Patient is a 59 y/o M with PMH as below who presents to CHF clinic for follow-up. He was hospitalized at [facility] earlier this year from 03/02/19 - 03/05/19 and 02/28/19 - 03/02/19 (left AMA and returned later that day) for Vtach, CHF exacerbation, COPD exacerbation. He was subsequently transferred to [facility] from 03/05/19-03/07/19 where he had ICD placed on 03/06/19.   Past Medical History:  Diagnosis Date  . CHF (congestive heart failure) ([place])   . Chronic pain   . COPD (chronic obstructive pulmonary disease) ([place])   . Depression   . Hepatitis C   . Hyperlipidemia   . Hypertension   . Spinal stenosis   . Subarachnoid hemorrhage ([facility])       OBJECTIVE   Vital signs: HR 71, BP 122/85, weight (pounds) 204 ECHO: Date 03/01/19, EF 25-30%, notes: Mild LVH, LV global hypokinesis, moderate LA dilation, RV systolic function low normal Cath: Date 03/04/19, EF 25%, notes: Occlusion of mid circumflex after obtuse marginal 1 with codominant 65% proximal LAD  stenosis  BMP Latest Ref Rng & Units 03/05/2019 03/04/2019 03/02/2019  Glucose 70 - 99 mg/dL 117(H) 116(H) 130(H)  BUN 6 - 20 mg/dL 21(H) 21(H) 30(H)  Creatinine 0.61 - 1.24 mg/dL 0.62 0.66 0.77  Sodium 135 - 145 mmol/L 139 136 135  Potassium 3.5 - 5.1 mmol/L 3.8 3.2(L) 3.7  Chloride 98 - 111 mmol/L 101 98 98  CO2 22 - 32 mmol/L 29 29 27   Calcium 8.9 - 10.3 mg/dL 8.4(L) 8.0(L) 7.7(L)    ASSESSMENT Patient is well-appearing in no acute distress. He is wearing a knee brace on his left knee. He is accompanied by his wife to clinic today. Reports breathing has been stable and denies edema.  Therapy was changed from lisinopril to Entresto at last visit. After starting Entresto, patient became dizzy with associated blurry vision and was only taking it once per day due to side effects. Side effects subsided and patient started taking it two times per day as prescribed with no further issues. He has had multiple mechanical falls at home that are not associated with dizziness or syncope. He states that he still experiences some dizziness. Patient reports it occurs while sitting and is not related positional changes/movement.   PLAN  1). CHF -Follows with CHF clinic + cardiologist (Dr. Saralyn Pilar) -Entresto 24-26 mg BID, carvedilol 3.125 mg BID, spironolactone 25 mg daily -Spoke with provider- no changes to meds, BMP today  2). Vtach s/p DC ICD on 03/06/19 -Amiodarone 400 mg daily (load complete) -BMP today with K 4 -Baseline TSH 2.095 02/28/19 -  Baseline LFTs elevated with Alk phos 76, AST 96, ALT 172 -Patient to establish care with pulmonology  3). Hypertension -Normotensive in office today. Patient reports that he has a history of high diastolic blood pressures -Patient does not check BP at home. Advised patient to obtain blood pressure cuff  4). CAD -Denies chest pain. Never received nitroglycerin SL tablets -Aspirin 81 mg daily + atorvastatin 80 mg daily  5). COPD -He is to establish care  with pulmonology -Respiratory medications include Symbicort 160-4.5 two puff BID + albuterol HFA PRN -Patient is still smoking 1 PPD and on nicotine 21 mg/24h patch. He was recently started on bupropion -Consider addition of LAMA to respiratory regimen  6). Cirrhosis  -He has established care with [facility] PCP and has been referred to hepatologist for further evaluation/treatment  7). Chronic pain -Gabapentin 300 mg BID, oxycodone 15 mg q4h for moderate pain, cyclobenzaprine 5-10 mg BID   Time spent: 25 minutes  [place] Resident 06/16/2019 9:42 AM    Current Outpatient Medications:  .  amiodarone (PACERONE) 400 MG tablet, Take 1 tablet (400 mg total) by mouth 2 (two) times daily. (Patient taking differently: Take 400 mg by mouth daily. ), Disp:  , Rfl:  .  aspirin EC 81 MG EC tablet, Take 1 tablet (81 mg total) by mouth daily., Disp:  , Rfl:  .  atorvastatin (LIPITOR) 80 MG tablet, Take 1 tablet (80 mg total) by mouth daily at 6 PM., Disp:  , Rfl:  .  carvedilol (COREG) 3.125 MG tablet, Take 1 tablet (3.125 mg total) by mouth 2 (two) times daily with a meal., Disp:  , Rfl:  .  naproxen sodium (ALEVE) 220 MG tablet, Take 220 mg by mouth daily as needed., Disp: , Rfl:  .  nicotine (NICODERM CQ - DOSED IN MG/24 HOURS) 21 mg/24hr patch, Place 1 patch (21 mg total) onto the skin daily., Disp: 28 patch, Rfl: 0 .  Nicotine Polacrilex (NICORETTE MT), Use as directed in the mouth or throat., Disp: , Rfl:  .  nitroGLYCERIN (NITROSTAT) 0.4 MG SL tablet, Place 1 tablet (0.4 mg total) under the tongue every 5 (five) minutes x 3 doses as needed for chest pain. (Patient not taking: Reported on 05/28/2019), Disp:  , Rfl: 12 .  oxyCODONE (ROXICODONE) 15 MG immediate release tablet, Take 1 tablet (15 mg total) by mouth every 4 (four) hours as needed for moderate pain., Disp: 30 tablet, Rfl: 0 .  sacubitril-valsartan (ENTRESTO) 24-26 MG, Take 1 tablet by mouth 2 (two) times daily., Disp: 60  tablet, Rfl: 3 .  spironolactone (ALDACTONE) 25 MG tablet, Take 1 tablet (25 mg total) by mouth daily., Disp:  , Rfl:    COUNSELING POINTS/CLINICAL PEARLS  Carvedilol (Goal: weight less than 85 kg is 25 mg BID, weight greater than 85 kg is 50 mg BID)  Patient should avoid activities requiring coordination until drug effects are realized, as drug may cause dizziness.  This drug may cause diarrhea, nausea, vomiting, arthralgia, back pain, myalgia, headache, vision disorder, erectile dysfunction, reduced libido, or fatigue.  Instruct patient to report signs/symptoms of adverse cardiovascular effects such as hypotension (especially in elderly patients), arrhythmias, syncope, palpitations, angina, or edema.  Drug may mask symptoms of hypoglycemia. Advise diabetic patients to carefully monitor blood sugar levels.  Patient should take drug with food.  Advise patient against sudden discontinuation of drug. Entresto (Goal: 97/103 mg twice daily)  Warn male patient to avoid pregnancy during therapy and to  report a pregnancy to a physician.  Advise patient to report symptomatic hypotension.  Side effects may include hyperkalemia, cough, dizziness, or renal failure. Spironolactone  Warn patient to report dehydration, hypotension, or symptoms of worsening renal function.  Counsel male patient to report gynecomastia.  Side effects may include diarrhea, nausea, vomiting, abdominal cramping, fever, leg cramps, lethargy, mental confusion, decreased libido, irregular menses, and rash. Suspension: Tell patient to take drug consistently with respect to food, either before or after a meal.  Advise patient to avoid potassium supplements and foods containing high levels of potassium, including salt substitutes.  DRUGS TO AVOID IN HEART FAILURE  Drug or Class Mechanism  Analgesics . NSAIDs . COX-2 inhibitors . Glucocorticoids  Sodium and water retention, increased systemic vascular resistance, decreased  response to diuretics   Diabetes Medications . Metformin . Thiazolidinediones o Rosiglitazone (Avandia) o Pioglitazone (Actos) . DPP4 Inhibitors o Saxagliptin (Onglyza) o Sitagliptin (Januvia)   Lactic acidosis Possible calcium channel blockade   Unknown  Antiarrhythmics . Class I  o Flecainide o Disopyramide . Class III o Sotalol . Other o Dronedarone  Negative inotrope, proarrhythmic   Proarrhythmic, beta blockade  Negative inotrope  Antihypertensives . Alpha Blockers o Doxazosin . Calcium Channel Blockers o Diltiazem o Verapamil o Nifedipine . Central Alpha Adrenergics o Moxonidine . Peripheral Vasodilators o Minoxidil  Increases renin and aldosterone  Negative inotrope    Possible sympathetic withdrawal  Unknown  Anti-infective . Itraconazole . Amphotericin B  Negative inotrope Unknown  Hematologic . Anagrelide . Cilostazol   Possible inhibition of PD IV Inhibition of PD III causing arrhythmias  Neurologic/Psychiatric . Stimulants . Anti-Seizure Drugs o Carbamazepine o Pregabalin . Antidepressants o Tricyclics o Citalopram . Parkinsons o Bromocriptine o Pergolide o Pramipexole . Antipsychotics o Clozapine . Antimigraine o Ergotamine o Methysergide . Appetite suppressants . Bipolar o Lithium  Peripheral alpha and beta agonist activity  Negative inotrope and chronotrope Calcium channel blockade  Negative inotrope, proarrhythmic Dose-dependent QT prolongation  Excessive serotonin activity/valvular damage Excessive serotonin activity/valvular damage Unknown  IgE mediated hypersensitivy, calcium channel blockade  Excessive serotonin activity/valvular damage Excessive serotonin activity/valvular damage Valvular damage  Direct myofibrillar degeneration, adrenergic stimulation  Antimalarials . Chloroquine . Hydroxychloroquine Intracellular inhibition of lysosomal enzymes  Urologic Agents . Alpha  Blockers o Doxazosin o Prazosin o Tamsulosin o Terazosin  Increased renin and aldosterone  Adapted from Page RL, et al. "Drugs That May Cause or Exacerbate Heart Failure: A Scientific Statement from the [place]." Circulation 2016; 683:M19-Q22. DOI: 10.1161/CIR.0000000000000426   MEDICATION ADHERENCES TIPS AND STRATEGIES 1. Taking medication as prescribed improves patient outcomes in heart failure (reduces hospitalizations, improves symptoms, increases survival) 2. Side effects of medications can be managed by decreasing doses, switching agents, stopping drugs, or adding additional therapy. Please let someone in the [facility] know if you have having bothersome side effects so we can modify your regimen. Do not alter your medication regimen without talking to [place].  3. Medication reminders can help patients remember to take drugs on time. If you are missing or forgetting doses you can try linking behaviors, using pill boxes, or an electronic reminder like an alarm on your phone or an app. Some people can also get automated phone calls as medication reminders.

## 2019-06-16 NOTE — Patient Instructions (Signed)
Continue weighing daily and call for an overnight weight gain of > 2 pounds or a weekly weight gain of >5 pounds. 

## 2019-06-29 NOTE — Progress Notes (Signed)
Patient ID: Brent [name], male    DOB: [birth year], 59 y.o.   MRN: [id number]  HPI  Brent [name] is a 59 y/o male with a history of hyperlipidemia, HTN, COPD, chronic pain, hepatitis C, depression, spinal stenosis, subarachnoid hemorrhage, current tobacco use and chronic heart failure.   Echo report from 03/01/19 reviewed and showed an EF of 25-30% along with moderately elevated PA pressure.   Catheterization report from 03/04/19 showed:  Mid LAD lesion is 65% stenosed.  Mid Cx lesion is 100% stenosed.  Prox RCA to Mid RCA lesion is 30% stenosed.  Admitted 03/05/19 due to palpitations/ chest pain at outside hospital. Transferred. Initially received IV lasix and then transitioned to oral diuretics. Needed oxygen at outside hospital but now on room air. ICD implanted. Discharged after 3 days. Admitted 03/02/19 due to chest pain/ shortness of breath. Was in [place] and was shocked. Initially given IV lasix and then transitioned to oral diuretics. Cardiology consult obtained. Transferred to [facility] after 3 days. Admitted 02/28/19 due to chest pain. Cardiology consult obtained. Amiodarone drip started & then transitioned to oral medication. Was on IV heparin when he pulled out his IV. Given IV lasix. Patient left AMA.   Patient presents today for a follow-up visit with a chief complaint of moderate fatigue upon minimal exertion. He says that this has been present for several months although it isn't any worse. He has associated shortness of breath, wheezing, left leg numbness/ weakness, depression, chronic difficulty sleeping and chronic pain along with this. He denies any dizziness, anxiety, abdominal distention, palpitations, pedal edema, chest pain, cough or weight gain.   Has recently been started on wellbutrin and his partner says that she's noticed that he seems more irritable than usual.   Past Medical History:  Diagnosis Date  . CHF (congestive heart failure) ([place])   . Chronic pain   . COPD (chronic  obstructive pulmonary disease) ([place])   . Depression   . Hepatitis C   . Hyperlipidemia   . Hypertension   . Spinal stenosis   . Subarachnoid hemorrhage [facility])    Past Surgical History:  Procedure Laterality Date  . LEFT HEART CATH AND CORONARY ANGIOGRAPHY N/A 03/04/2019   Procedure: LEFT HEART CATH AND CORONARY ANGIOGRAPHY;  Surgeon: Corey Skains, MD;  Location: [facility];  Service: Cardiovascular;  Laterality: N/A;   No family history on file. Social History   Tobacco Use  . Smoking status: Current Every Day Smoker    Packs/day: 2.00    Types: Cigarettes  . Smokeless tobacco: Never Used  Substance Use Topics  . Alcohol use: Not Currently   No Active Allergies  Prior to Admission medications   Medication Sig Start Date End Date Taking? Authorizing Provider  albuterol (VENTOLIN HFA) 108 (90 Base) MCG/ACT inhaler Inhale 2 puffs into the lungs every 6 (six) hours as needed. 06/05/19 06/04/20  [provider]  amiodarone (PACERONE) 400 MG tablet Take 1 tablet (400 mg total) by mouth 2 (two) times daily. Patient taking differently: Take 400 mg by mouth daily.  03/05/19   Danford, Suann Larry, MD  aspirin EC 81 MG EC tablet Take 1 tablet (81 mg total) by mouth daily. 03/06/19   Danford, Suann Larry, MD  atorvastatin (LIPITOR) 80 MG tablet Take 1 tablet (80 mg total) by mouth daily at 6 PM. 03/05/19   Danford, Suann Larry, MD  budesonide-formoterol (SYMBICORT) 160-4.5 MCG/ACT inhaler Inhale 2 puffs into the lungs 2 (two) times daily. 06/05/19 06/04/20  [provider]  buPROPion (WELLBUTRIN XL) 150 MG 24 hr tablet Take 150 mg by mouth daily. 06/05/19 06/04/20  [provider]  carvedilol (COREG) 3.125 MG tablet Take 1 tablet (3.125 mg total) by mouth 2 (two) times daily with a meal. 03/05/19   Danford, Earl Lites, MD  cyclobenzaprine (FLEXERIL) 10 MG tablet Take 5-10 mg by mouth 2 (two) times daily.    [provider]  gabapentin  (NEURONTIN) 300 MG capsule Take 300 mg by mouth 2 (two) times daily.    [provider]  naproxen sodium (ALEVE) 220 MG tablet Take 220 mg by mouth daily as needed.    [provider]  nicotine (NICODERM CQ - DOSED IN MG/24 HOURS) 21 mg/24hr patch Place 1 patch (21 mg total) onto the skin daily. 03/05/19   Danford, Earl Lites, MD  Nicotine Polacrilex (NICORETTE MT) Use as directed in the mouth or throat.    [provider]  nitroGLYCERIN (NITROSTAT) 0.4 MG SL tablet Place 1 tablet (0.4 mg total) under the tongue every 5 (five) minutes x 3 doses as needed for chest pain. Patient not taking: Reported on 05/28/2019 03/05/19   Alberteen Sam, MD  oxyCODONE (ROXICODONE) 15 MG immediate release tablet Take 1 tablet (15 mg total) by mouth every 4 (four) hours as needed for moderate pain. 03/05/19   Danford, Earl Lites, MD  sacubitril-valsartan (ENTRESTO) 24-26 MG Take 1 tablet by mouth 2 (two) times daily. 05/28/19   Delma Freeze, FNP  spironolactone (ALDACTONE) 25 MG tablet Take 1 tablet (25 mg total) by mouth daily. 03/06/19   Danford, Earl Lites, MD      Review of Systems  Constitutional: Positive for fatigue (tire easily). Negative for appetite change.  HENT: Negative for congestion, postnasal drip and sore throat.   Eyes: Negative.   Respiratory: Positive for shortness of breath and wheezing. Negative for cough.   Cardiovascular: Negative for chest pain, palpitations and leg swelling.  Gastrointestinal: Negative for abdominal distention and abdominal pain.  Endocrine: Negative.   Genitourinary: Negative.   Musculoskeletal: Positive for arthralgias (left shoulder pain) and back pain.  Skin: Negative.   Allergic/Immunologic: Negative.   Neurological: Positive for weakness (left lower leg) and numbness (left lower leg). Negative for dizziness and light-headedness.  Hematological: Negative for adenopathy. Does not bruise/bleed easily.   Psychiatric/Behavioral: Positive for dysphoric mood and sleep disturbance (sleeping on 2 pillows). The patient is not nervous/anxious.    Vitals:   06/30/19 0931  BP: 127/77  Pulse: 72  Resp: 20  SpO2: 100%  Weight: 203 lb (92.1 kg)  Height: 6' (1.829 m)   Wt Readings from Last 3 Encounters:  06/30/19 203 lb (92.1 kg)  06/16/19 204 lb (92.5 kg)  05/28/19 203 lb 4 oz (92.2 kg)   Lab Results  Component Value Date   CREATININE 0.73 06/16/2019   CREATININE 0.62 03/05/2019   CREATININE 0.66 03/04/2019    Physical Exam Vitals and nursing note reviewed.  Constitutional:      Appearance: He is well-developed.  HENT:     Head: Normocephalic and atraumatic.  Neck:     Vascular: No JVD.  Cardiovascular:     Rate and Rhythm: Normal rate and regular rhythm.  Pulmonary:     Effort: Pulmonary effort is normal. No respiratory distress.     Breath sounds: No wheezing, rhonchi or rales.  Abdominal:     Palpations: Abdomen is soft.     Tenderness: There is no abdominal tenderness.  Musculoskeletal:     Cervical back: Normal range of motion and neck supple.     Right lower leg: No tenderness. No edema.     Left lower leg: No tenderness. No edema.  Skin:    General: Skin is warm and dry.  Neurological:     General: No focal deficit present.     Mental Status: He is alert and oriented to person, place, and time.  Psychiatric:        Mood and Affect: Mood normal.        Behavior: Behavior normal.     Assessment & Plan:  1: Chronic heart failure with reduced ejection fraction- - NYHA class III - euvolemic today - weighing daily; reminded to call for an overnight weight gain of >2 pounds or a weekly weight gain of >5 pounds - weight down 1 pound from last visit here 2 weeks ago - not adding salt and they are reviewing food labels for sodium content; reviewed the importance of closely following a low sodium diet  - saw cardiology (Paraschos) 06/03/19 - finish current dose of  entresto and then begin entresto 49/51mg  BID; will check BMP at his next visit - BNP 03/02/19 was 990.0 - PharmD reconciled medications with the patient - has received both his COVID vaccines   2: HTN- - BP looks good today - saw PCP ([name]) 06/05/19 - BMP 06/16/19 reviewed and showed sodium 140, potassium 4.0, creatinine 0.73 and GFR >60  3: COPD/ tobacco use-  - currently using nicotine patch as well as smoking 1 ppd of cigarettes  - recently started on wellbutrin - complete cessation discussed for 3 minutes with him; used to smoke 2 ppd  4: [place]- - ICD placed January 2021 - ICD recently interrogated without any shocks noted - no recent shocks to report  5: 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   Return in 6 weeks or sooner for any questions/problems before then.

## 2019-06-30 ENCOUNTER — Encounter: Payer: Self-pay | Admitting: Family

## 2019-06-30 ENCOUNTER — Other Ambulatory Visit: Payer: Self-pay

## 2019-06-30 ENCOUNTER — Ambulatory Visit: Payer: Medicaid Other | Attending: Family | Admitting: Family

## 2019-06-30 VITALS — BP 127/77 | HR 72 | Resp 20 | Ht 72.0 in | Wt 203.0 lb

## 2019-06-30 DIAGNOSIS — E785 Hyperlipidemia, unspecified: Secondary | ICD-10-CM | POA: Diagnosis not present

## 2019-06-30 DIAGNOSIS — Z95810 Presence of automatic (implantable) cardiac defibrillator: Secondary | ICD-10-CM | POA: Diagnosis not present

## 2019-06-30 DIAGNOSIS — Z7982 Long term (current) use of aspirin: Secondary | ICD-10-CM | POA: Insufficient documentation

## 2019-06-30 DIAGNOSIS — J449 Chronic obstructive pulmonary disease, unspecified: Secondary | ICD-10-CM | POA: Diagnosis not present

## 2019-06-30 DIAGNOSIS — I110 Hypertensive heart disease with heart failure: Secondary | ICD-10-CM | POA: Diagnosis not present

## 2019-06-30 DIAGNOSIS — B182 Chronic viral hepatitis C: Secondary | ICD-10-CM

## 2019-06-30 DIAGNOSIS — I10 Essential (primary) hypertension: Secondary | ICD-10-CM

## 2019-06-30 DIAGNOSIS — F17210 Nicotine dependence, cigarettes, uncomplicated: Secondary | ICD-10-CM | POA: Diagnosis not present

## 2019-06-30 DIAGNOSIS — M4800 Spinal stenosis, site unspecified: Secondary | ICD-10-CM | POA: Diagnosis not present

## 2019-06-30 DIAGNOSIS — B1920 Unspecified viral hepatitis C without hepatic coma: Secondary | ICD-10-CM | POA: Diagnosis not present

## 2019-06-30 DIAGNOSIS — Z7951 Long term (current) use of inhaled steroids: Secondary | ICD-10-CM | POA: Insufficient documentation

## 2019-06-30 DIAGNOSIS — I5022 Chronic systolic (congestive) heart failure: Secondary | ICD-10-CM | POA: Diagnosis present

## 2019-06-30 DIAGNOSIS — I472 Ventricular tachycardia, unspecified: Secondary | ICD-10-CM

## 2019-06-30 DIAGNOSIS — Z79899 Other long term (current) drug therapy: Secondary | ICD-10-CM | POA: Diagnosis not present

## 2019-06-30 DIAGNOSIS — B1910 Unspecified viral hepatitis B without hepatic coma: Secondary | ICD-10-CM | POA: Diagnosis not present

## 2019-06-30 DIAGNOSIS — G8929 Other chronic pain: Secondary | ICD-10-CM | POA: Diagnosis not present

## 2019-06-30 DIAGNOSIS — F329 Major depressive disorder, single episode, unspecified: Secondary | ICD-10-CM | POA: Diagnosis not present

## 2019-06-30 DIAGNOSIS — K7460 Unspecified cirrhosis of liver: Secondary | ICD-10-CM | POA: Insufficient documentation

## 2019-06-30 MED ORDER — SACUBITRIL-VALSARTAN 49-51 MG PO TABS: 1.0000 | ORAL_TABLET | Freq: Two times a day (BID) | ORAL | 5 refills | Status: DC

## 2019-06-30 NOTE — Patient Instructions (Addendum)
Continue weighing daily and call for an overnight weight gain of > 2 pounds or a weekly weight gain of >5 pounds.   Finish current dose of entresto 24/26mg . When you finish that bottle, you will begin the new dose of 49/51mg  as 1 tablet twice daily.

## 2019-06-30 NOTE — Progress Notes (Signed)
[facility] - PHARMACIST COUNSELING NOTE  ADHERENCE ASSESSMENT  Adherence strategy: Wife helps to manage medications at home. Utilizes a pillbox.   Do you ever forget to take your medication? [] Yes (1) [x] No (0)  Do you ever skip doses due to side effects? [] Yes (1) [x] No (0)  Do you have trouble affording your medicines? [] Yes (1) [x] No (0)  Are you ever unable to pick up your medication due to transportation difficulties? [] Yes (1) [x] No (0)  Do you ever stop taking your medications because you don't believe they are helping? [] Yes (1) [x] No (0)  Total score 0    Recommendations given to patient about increasing adherence: None needed  Guideline-Directed Medical Therapy/Evidence Based Medicine  ACE/ARB/ARNI: Entresto 24-26 mg BID Beta Blocker: Carvedilol 3.125 mg BID Aldosterone Antagonist: Spironolactone 25 mg daily Diuretic: None    SUBJECTIVE  HPI: Patient is a 59 y/o M with PMH as below who presents to CHF clinic for follow-up. He was hospitalized at [facility] earlier this year from 03/02/19 - 03/05/19 and 02/28/19 - 03/02/19 (left AMA and returned later that day) for Vtach, CHF exacerbation, COPD exacerbation. He was subsequently transferred to [facility] from 03/05/19-03/07/19 where he had ICD placed on 03/06/19.   Past Medical History:  Diagnosis Date  . CHF (congestive heart failure) ([place])   . Chronic pain   . COPD (chronic obstructive pulmonary disease) ([place])   . Depression   . Hepatitis C   . Hyperlipidemia   . Hypertension   . Spinal stenosis   . Subarachnoid hemorrhage ([facility])      OBJECTIVE   Vital signs: HR 72, BP 127/77, weight (pounds) 203 ECHO: Date 03/01/19, EF 25-30%, notes: mild LVH, mildly dilated LV internal cavity. RV with low normal systolic function. LA moderately dilated. Moderately elevated PA systolic pressure. Cath: Date 03/04/19, EF 25%, notes: Occlusion of mid circumflex after obtuse marginal 1 with codominant 65%  proximal LAD stenosis  BMP Latest Ref Rng & Units 06/16/2019 03/05/2019 03/04/2019  Glucose 70 - 99 mg/dL 92 117(H) 116(H)  BUN 6 - 20 mg/dL 19 21(H) 21(H)  Creatinine 0.61 - 1.24 mg/dL 0.73 0.62 0.66  Sodium 135 - 145 mmol/L 140 139 136  Potassium 3.5 - 5.1 mmol/L 4.0 3.8 3.2(L)  Chloride 98 - 111 mmol/L 103 101 98  CO2 22 - 32 mmol/L 28 29 29   Calcium 8.9 - 10.3 mg/dL 8.9 8.4(L) 8.0(L)    ASSESSMENT  Patient is well-appearing in no acute distress. Endorses shortness of breath associated with walk to clinic. Denies edema. Patient endorses taking medications daily as prescribed. Patient reports dizziness and blurry vision has improved. He was recently started on Wellbutrin to help with smoking cessation and reports that he has been agitated and it has not helped decrease tobacco use.  PLAN  1). CHF -Follows with CHF clinic + cardiologist (Dr. Saralyn Pilar) -Entresto 24-26 mg BID, carvedilol 3.125 mg BID, spironolactone 25 mg daily -Spoke with provider with plan to increase Entresto to 49-51 mg BID  2). Vtach s/p DC ICD on 03/06/19 -Amiodarone 400 mg daily (load complete) -5/11 K 4, 4/22 Mg 2.2 -Baseline TSH 2.095 02/28/19 -Baseline LFTs elevated with Alk phos 76, AST 96, ALT 172 on 02/28/19 -Patient to establish care with pulmonology  3). Hypertension -Normotensive in office today -Patient does not check BP at home. Advised patient to obtain blood pressure cuff  4). CAD -Denies chest pain. Never received nitroglycerin SL tablets -Aspirin 81 mg daily + atorvastatin  80 mg daily  5). COPD -He is to establish care with pulmonology -Respiratory medications include Symbicort 160-4.5 two puff BID + albuterol HFA PRN -Patient is still smoking 1 PPD and on nicotine patch + gum. He was recently started on bupropion -Consider addition of LAMA to respiratory regimen  6). Cirrhosis  -He has established care with [facility] PCP and has been referred to hepatologist for further evaluation of  hepatitis B and C  7). Chronic pain -Gabapentin 300 mg BID, oxycodone 15 mg q4h for moderate pain, cyclobenzaprine 5-10 mg BID  Time spent: 15 minutes  [place] Resident 06/30/2019 9:29 AM    Current Outpatient Medications:  .  albuterol (VENTOLIN HFA) 108 (90 Base) MCG/ACT inhaler, Inhale 2 puffs into the lungs every 6 (six) hours as needed., Disp: , Rfl:  .  amiodarone (PACERONE) 400 MG tablet, Take 1 tablet (400 mg total) by mouth 2 (two) times daily. (Patient taking differently: Take 400 mg by mouth daily. ), Disp:  , Rfl:  .  aspirin EC 81 MG EC tablet, Take 1 tablet (81 mg total) by mouth daily., Disp:  , Rfl:  .  atorvastatin (LIPITOR) 80 MG tablet, Take 1 tablet (80 mg total) by mouth daily at 6 PM., Disp:  , Rfl:  .  budesonide-formoterol (SYMBICORT) 160-4.5 MCG/ACT inhaler, Inhale 2 puffs into the lungs 2 (two) times daily., Disp: , Rfl:  .  buPROPion (WELLBUTRIN XL) 150 MG 24 hr tablet, Take 150 mg by mouth daily., Disp: , Rfl:  .  carvedilol (COREG) 3.125 MG tablet, Take 1 tablet (3.125 mg total) by mouth 2 (two) times daily with a meal., Disp:  , Rfl:  .  cyclobenzaprine (FLEXERIL) 10 MG tablet, Take 5-10 mg by mouth 2 (two) times daily., Disp: , Rfl:  .  gabapentin (NEURONTIN) 300 MG capsule, Take 300 mg by mouth 2 (two) times daily., Disp: , Rfl:  .  naproxen sodium (ALEVE) 220 MG tablet, Take 220 mg by mouth daily as needed., Disp: , Rfl:  .  nicotine (NICODERM CQ - DOSED IN MG/24 HOURS) 21 mg/24hr patch, Place 1 patch (21 mg total) onto the skin daily., Disp: 28 patch, Rfl: 0 .  Nicotine Polacrilex (NICORETTE MT), Use as directed in the mouth or throat., Disp: , Rfl:  .  nitroGLYCERIN (NITROSTAT) 0.4 MG SL tablet, Place 1 tablet (0.4 mg total) under the tongue every 5 (five) minutes x 3 doses as needed for chest pain. (Patient not taking: Reported on 05/28/2019), Disp:  , Rfl: 12 .  oxyCODONE (ROXICODONE) 15 MG immediate release tablet, Take 1 tablet (15 mg  total) by mouth every 4 (four) hours as needed for moderate pain., Disp: 30 tablet, Rfl: 0 .  sacubitril-valsartan (ENTRESTO) 24-26 MG, Take 1 tablet by mouth 2 (two) times daily., Disp: 60 tablet, Rfl: 3 .  spironolactone (ALDACTONE) 25 MG tablet, Take 1 tablet (25 mg total) by mouth daily., Disp:  , Rfl:    COUNSELING POINTS/CLINICAL PEARLS  Carvedilol (Goal: weight less than 85 kg is 25 mg BID, weight greater than 85 kg is 50 mg BID)  Patient should avoid activities requiring coordination until drug effects are realized, as drug may cause dizziness.  This drug may cause diarrhea, nausea, vomiting, arthralgia, back pain, myalgia, headache, vision disorder, erectile dysfunction, reduced libido, or fatigue.  Instruct patient to report signs/symptoms of adverse cardiovascular effects such as hypotension (especially in elderly patients), arrhythmias, syncope, palpitations, angina, or edema.  Drug may mask  symptoms of hypoglycemia. Advise diabetic patients to carefully monitor blood sugar levels.  Patient should take drug with food.  Advise patient against sudden discontinuation of drug. Entresto (Goal: 97/103 mg twice daily)  Warn male patient to avoid pregnancy during therapy and to report a pregnancy to a physician.  Advise patient to report symptomatic hypotension.  Side effects may include hyperkalemia, cough, dizziness, or renal failure. Spironolactone  Warn patient to report dehydration, hypotension, or symptoms of worsening renal function.  Counsel male patient to report gynecomastia.  Side effects may include diarrhea, nausea, vomiting, abdominal cramping, fever, leg cramps, lethargy, mental confusion, decreased libido, irregular menses, and rash. Suspension: Tell patient to take drug consistently with respect to food, either before or after a meal.  Advise patient to avoid potassium supplements and foods containing high levels of potassium, including salt substitutes.  DRUGS TO AVOID  IN HEART FAILURE  Drug or Class Mechanism  Analgesics . NSAIDs . COX-2 inhibitors . Glucocorticoids  Sodium and water retention, increased systemic vascular resistance, decreased response to diuretics   Diabetes Medications . Metformin . Thiazolidinediones o Rosiglitazone (Avandia) o Pioglitazone (Actos) . DPP4 Inhibitors o Saxagliptin (Onglyza) o Sitagliptin (Januvia)   Lactic acidosis Possible calcium channel blockade   Unknown  Antiarrhythmics . Class I  o Flecainide o Disopyramide . Class III o Sotalol . Other o Dronedarone  Negative inotrope, proarrhythmic   Proarrhythmic, beta blockade  Negative inotrope  Antihypertensives . Alpha Blockers o Doxazosin . Calcium Channel Blockers o Diltiazem o Verapamil o Nifedipine . Central Alpha Adrenergics o Moxonidine . Peripheral Vasodilators o Minoxidil  Increases renin and aldosterone  Negative inotrope    Possible sympathetic withdrawal  Unknown  Anti-infective . Itraconazole . Amphotericin B  Negative inotrope Unknown  Hematologic . Anagrelide . Cilostazol   Possible inhibition of PD IV Inhibition of PD III causing arrhythmias  Neurologic/Psychiatric . Stimulants . Anti-Seizure Drugs o Carbamazepine o Pregabalin . Antidepressants o Tricyclics o Citalopram . Parkinsons o Bromocriptine o Pergolide o Pramipexole . Antipsychotics o Clozapine . Antimigraine o Ergotamine o Methysergide . Appetite suppressants . Bipolar o Lithium  Peripheral alpha and beta agonist activity  Negative inotrope and chronotrope Calcium channel blockade  Negative inotrope, proarrhythmic Dose-dependent QT prolongation  Excessive serotonin activity/valvular damage Excessive serotonin activity/valvular damage Unknown  IgE mediated hypersensitivy, calcium channel blockade  Excessive serotonin activity/valvular damage Excessive serotonin activity/valvular damage Valvular damage  Direct myofibrillar  degeneration, adrenergic stimulation  Antimalarials . Chloroquine . Hydroxychloroquine Intracellular inhibition of lysosomal enzymes  Urologic Agents . Alpha Blockers o Doxazosin o Prazosin o Tamsulosin o Terazosin  Increased renin and aldosterone  Adapted from Page RL, et al. "Drugs That May Cause or Exacerbate Heart Failure: A Scientific Statement from the [employer]." Circulation 2016; 099:I33-A25. DOI: 10.1161/CIR.0000000000000426   MEDICATION ADHERENCES TIPS AND STRATEGIES 1. Taking medication as prescribed improves patient outcomes in heart failure (reduces hospitalizations, improves symptoms, increases survival) 2. Side effects of medications can be managed by decreasing doses, switching agents, stopping drugs, or adding additional therapy. Please let someone in the [facility] know if you have having bothersome side effects so we can modify your regimen. Do not alter your medication regimen without talking to [place].  3. Medication reminders can help patients remember to take drugs on time. If you are missing or forgetting doses you can try linking behaviors, using pill boxes, or an electronic reminder like an alarm on your phone or an app. Some people can also get automated phone  calls as medication reminders.

## 2019-07-13 ENCOUNTER — Other Ambulatory Visit: Payer: Self-pay | Admitting: Family

## 2019-07-13 ENCOUNTER — Encounter: Payer: Self-pay | Admitting: Family

## 2019-07-13 MED ORDER — NICOTINE 21 MG/24HR TD PT24: 21.0000 mg | MEDICATED_PATCH | Freq: Every day | TRANSDERMAL | 5 refills | Status: DC

## 2019-08-11 NOTE — Progress Notes (Deleted)
Patient ID: Brent [name], male    DOB: February 27, 1960, 59 y.o.   MRN: [id number]  HPI  Mr Swallows is a 59 y/o male with a history of hyperlipidemia, HTN, COPD, chronic pain, hepatitis C, depression, spinal stenosis, subarachnoid hemorrhage, current tobacco use and chronic heart failure.   Echo report from 03/01/19 reviewed and showed an EF of 25-30% along with moderately elevated PA pressure.   Catheterization report from 03/04/19 showed:  Mid LAD lesion is 65% stenosed.  Mid Cx lesion is 100% stenosed.  Prox RCA to Mid RCA lesion is 30% stenosed.  Admitted 03/05/19 due to palpitations/ chest pain at outside hospital. Transferred. Initially received IV lasix and then transitioned to oral diuretics. Needed oxygen at outside hospital but now on room air. ICD implanted. Discharged after 3 days. Admitted 03/02/19 due to chest pain/ shortness of breath. Was in [place] and was shocked. Initially given IV lasix and then transitioned to oral diuretics. Cardiology consult obtained. Transferred to [facility] after 3 days. Admitted 02/28/19 due to chest pain. Cardiology consult obtained. Amiodarone drip started & then transitioned to oral medication. Was on IV heparin when he pulled out his IV. Given IV lasix. Patient left AMA.   Patient presents today for a follow-up visit with a chief complaint of   Past Medical History:  Diagnosis Date  . CHF (congestive heart failure) (H[facility])   . Chronic pain   . COPD (chronic obstructive pulmonary disease) ([facility])   . Depression   . Hepatitis C   . Hyperlipidemia   . Hypertension   . Spinal stenosis   . Subarachnoid hemorrhage [facility])    Past Surgical History:  Procedure Laterality Date  . LEFT HEART CATH AND CORONARY ANGIOGRAPHY N/A 03/04/2019   Procedure: LEFT HEART CATH AND CORONARY ANGIOGRAPHY;  Surgeon: Lamar Blinks, MD;  Location: [facility] INVASIVE CV LAB;  Service: Cardiovascular;  Laterality: N/A;   No family history on file. Social History   Tobacco Use  . Smoking  status: Current Every Day Smoker    Packs/day: 2.00    Types: Cigarettes  . Smokeless tobacco: Never Used  Substance Use Topics  . Alcohol use: Not Currently   No Active Allergies      Review of Systems  Constitutional: Positive for fatigue (tire easily). Negative for appetite change.  HENT: Negative for congestion, postnasal drip and sore throat.   Eyes: Negative.   Respiratory: Positive for shortness of breath and wheezing. Negative for cough.   Cardiovascular: Negative for chest pain, palpitations and leg swelling.  Gastrointestinal: Negative for abdominal distention and abdominal pain.  Endocrine: Negative.   Genitourinary: Negative.   Musculoskeletal: Positive for arthralgias (left shoulder pain) and back pain.  Skin: Negative.   Allergic/Immunologic: Negative.   Neurological: Positive for weakness (left lower leg) and numbness (left lower leg). Negative for dizziness and light-headedness.  Hematological: Negative for adenopathy. Does not bruise/bleed easily.  Psychiatric/Behavioral: Positive for dysphoric mood and sleep disturbance (sleeping on 2 pillows). The patient is not nervous/anxious.      Physical Exam Vitals and nursing note reviewed.  Constitutional:      Appearance: He is well-developed.  HENT:     Head: Normocephalic and atraumatic.  Neck:     Vascular: No JVD.  Cardiovascular:     Rate and Rhythm: Normal rate and regular rhythm.  Pulmonary:     Effort: Pulmonary effort is normal. No respiratory distress.     Breath sounds: No wheezing, rhonchi or rales.  Abdominal:  Palpations: Abdomen is soft.     Tenderness: There is no abdominal tenderness.  Musculoskeletal:     Cervical back: Normal range of motion and neck supple.     Right lower leg: No tenderness. No edema.     Left lower leg: No tenderness. No edema.  Skin:    General: Skin is warm and dry.  Neurological:     General: No focal deficit present.     Mental Status: He is alert and  oriented to person, place, and time.  Psychiatric:        Mood and Affect: Mood normal.        Behavior: Behavior normal.     Assessment & Plan:  1: Chronic heart failure with reduced ejection fraction- - NYHA class III - euvolemic today - weighing daily; reminded to call for an overnight weight gain of >2 pounds or a weekly weight gain of >5 pounds - weight 203 pound from last visit here 6 weeks ago - not adding salt and they are reviewing food labels for sodium content; reviewed the importance of closely following a low sodium diet  - saw cardiology (Paraschos) 06/03/19 - entresto increased to 49/51mg  BID at last visit - check BMP today - BNP 03/02/19 was 990.0 - PharmD reconciled medications with the patient - has received both his COVID vaccines   2: HTN- - BP  - saw PCP ([name]) 06/05/19 - BMP 06/16/19 reviewed and showed sodium 140, potassium 4.0, creatinine 0.73 and GFR >60  3: COPD/ tobacco use-  - currently using nicotine patch as well as smoking 1 ppd of cigarettes  - recently started on wellbutrin - complete cessation discussed for 3 minutes with him; used to smoke 2 ppd  4: VT- - ICD placed January 2021 - ICD recently interrogated without any shocks noted - no recent shocks to report  5: 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

## 2019-08-12 ENCOUNTER — Ambulatory Visit: Payer: Medicaid Other | Admitting: Family

## 2019-08-12 ENCOUNTER — Encounter: Payer: Self-pay | Admitting: Family

## 2019-08-14 ENCOUNTER — Encounter: Payer: Self-pay | Admitting: Family

## 2019-08-14 ENCOUNTER — Other Ambulatory Visit: Payer: Self-pay | Admitting: Family

## 2019-08-14 MED ORDER — CARVEDILOL 3.125 MG PO TABS: 3.1250 mg | ORAL_TABLET | Freq: Two times a day (BID) | ORAL | 5 refills | Status: DC

## 2019-08-16 NOTE — Progress Notes (Signed)
Patient ID: Brent [name], male    DOB: 09-07-1960, 59 y.o.   MRN: [id number]  HPI  [name] is a 59 y/o male with a history of hyperlipidemia, HTN, COPD, chronic pain, hepatitis C, depression, spinal stenosis, subarachnoid hemorrhage, current tobacco use and chronic heart failure.   Echo report from 03/01/19 reviewed and showed an EF of 25-30% along with moderately elevated PA pressure.   Catheterization report from 03/04/19 showed:  Mid LAD lesion is 65% stenosed.  Mid Cx lesion is 100% stenosed.  Prox RCA to Mid RCA lesion is 30% stenosed.  Admitted 03/05/19 due to palpitations/ chest pain at outside hospital. Transferred. Initially received IV lasix and then transitioned to oral diuretics. Needed oxygen at outside hospital but now on room air. ICD implanted. Discharged after 3 days. Admitted 03/02/19 due to chest pain/ shortness of breath. Was in [place] and was shocked. Initially given IV lasix and then transitioned to oral diuretics. Cardiology consult obtained. Transferred to [facility] after 3 days. Admitted 02/28/19 due to chest pain. Cardiology consult obtained. Amiodarone drip started & then transitioned to oral medication. Was on IV heparin when he pulled out his IV. Given IV lasix. Patient left AMA.   Patient presents today for a follow-up visit with a chief complaint of moderate fatigue upon moderate exertion. He describes this as having been present for several years but may be improving as he's able to do more activity than previously. He has associated cough, shortness of breath, wheezing, palpitations, weakness, depression, intermittent blurry vision and chronic difficulty sleeping along with this. He denies any dizziness, abdominal distention, pedal edema, chest pain or weight gain.   Has been taking an OTC supplement called force factor to help with his energy level but has noticed that his symptoms have worsened some since taking it.    Past Medical History:  Diagnosis Date  . CHF  (congestive heart failure) (HCC)   . Chronic pain   . COPD (chronic obstructive pulmonary disease) (HCC)   . Depression   . Hepatitis C   . Hyperlipidemia   . Hypertension   . Spinal stenosis   . Subarachnoid hemorrhage [facility])    Past Surgical History:  Procedure Laterality Date  . LEFT HEART CATH AND CORONARY ANGIOGRAPHY N/A 03/04/2019   Procedure: LEFT HEART CATH AND CORONARY ANGIOGRAPHY;  Surgeon: Lamar Blinks, MD;  Location: [facility] INVASIVE CV LAB;  Service: Cardiovascular;  Laterality: N/A;   No family history on file. Social History   Tobacco Use  . Smoking status: Current Every Day Smoker    Packs/day: 2.00    Types: Cigarettes  . Smokeless tobacco: Never Used  Substance Use Topics  . Alcohol use: Not Currently   No Active Allergies  Prior to Admission medications   Medication Sig Start Date End Date Taking? Authorizing Provider  albuterol (VENTOLIN HFA) 108 (90 Base) MCG/ACT inhaler Inhale 2 puffs into the lungs every 6 (six) hours as needed. 06/05/19 06/04/20 Yes [provider]  amiodarone (PACERONE) 400 MG tablet Take 1 tablet (400 mg total) by mouth daily. 08/17/19  Yes Clarisa Kindred A, FNP  aspirin EC 81 MG EC tablet Take 1 tablet (81 mg total) by mouth daily. 03/06/19  Yes Danford, Earl Lites, MD  atorvastatin (LIPITOR) 80 MG tablet Take 1 tablet (80 mg total) by mouth daily at 6 PM. 08/17/19  Yes Shalinda Burkholder, Jarold Song, FNP  budesonide-formoterol (SYMBICORT) 160-4.5 MCG/ACT inhaler Inhale 2 puffs into the lungs 2 (two) times daily. 06/05/19 06/04/20 Yes  [provider]  carvedilol (COREG) 3.125 MG tablet Take 1 tablet (3.125 mg total) by mouth 2 (two) times daily with a meal. 08/14/19  Yes Artist Bloom A, FNP  cyclobenzaprine (FLEXERIL) 10 MG tablet Take 5-10 mg by mouth 2 (two) times daily.   Yes [provider]  gabapentin (NEURONTIN) 300 MG capsule Take 300 mg by mouth 2 (two) times daily.   Yes [provider]  naproxen sodium (ALEVE)  220 MG tablet Take 220 mg by mouth daily as needed.   Yes [provider]  nicotine (NICODERM CQ - DOSED IN MG/24 HOURS) 21 mg/24hr patch Place 1 patch (21 mg total) onto the skin daily. 07/13/19  Yes Arrian Manson, Inetta Fermo A, FNP  nitroGLYCERIN (NITROSTAT) 0.4 MG SL tablet Place 1 tablet (0.4 mg total) under the tongue every 5 (five) minutes x 3 doses as needed for chest pain. 03/05/19  Yes Danford, Earl Lites, MD  oxyCODONE (ROXICODONE) 15 MG immediate release tablet Take 1 tablet (15 mg total) by mouth every 4 (four) hours as needed for moderate pain. 03/05/19  Yes Danford, Earl Lites, MD  sacubitril-valsartan (ENTRESTO) 49-51 MG Take 1 tablet by mouth 2 (two) times daily. 08/17/19  Yes Clarisa Kindred A, FNP  spironolactone (ALDACTONE) 25 MG tablet Take 1 tablet (25 mg total) by mouth daily. 08/17/19  Yes Okey Zelek A, FNP  buPROPion (WELLBUTRIN XL) 150 MG 24 hr tablet Take 150 mg by mouth daily. Patient not taking: Reported on 08/17/2019 06/05/19 06/04/20  [provider]    Review of Systems  Constitutional: Positive for fatigue (tire easily). Negative for appetite change.  HENT: Negative for congestion, postnasal drip and sore throat.   Eyes: Positive for visual disturbance.  Respiratory: Positive for cough (at times), shortness of breath and wheezing.   Cardiovascular: Positive for palpitations. Negative for chest pain and leg swelling.  Gastrointestinal: Negative for abdominal distention and abdominal pain.  Endocrine: Negative.   Genitourinary: Negative.   Musculoskeletal: Positive for arthralgias (left shoulder pain) and back pain.  Skin: Negative.   Allergic/Immunologic: Negative.   Neurological: Positive for weakness (left lower leg) and numbness (left lower leg). Negative for dizziness and light-headedness.  Hematological: Negative for adenopathy. Does not bruise/bleed easily.  Psychiatric/Behavioral: Positive for dysphoric mood and sleep disturbance (sleeping on 2  pillows). The patient is not nervous/anxious.    Vitals:   08/17/19 1106  BP: (!) 149/87  Pulse: 98  Resp: 20  SpO2: 100%  Weight: 203 lb 2 oz (92.1 kg)  Height: 6' (1.829 m)   Wt Readings from Last 3 Encounters:  08/17/19 203 lb 2 oz (92.1 kg)  06/30/19 203 lb (92.1 kg)  06/16/19 204 lb (92.5 kg)   Lab Results  Component Value Date   CREATININE 0.73 06/16/2019   CREATININE 0.62 03/05/2019   CREATININE 0.66 03/04/2019    Physical Exam Vitals and nursing note reviewed.  Constitutional:      Appearance: He is well-developed.  HENT:     Head: Normocephalic and atraumatic.  Neck:     Vascular: No JVD.  Cardiovascular:     Rate and Rhythm: Normal rate and regular rhythm.  Pulmonary:     Effort: Pulmonary effort is normal. No respiratory distress.     Breath sounds: No wheezing, rhonchi or rales.  Abdominal:     Palpations: Abdomen is soft.     Tenderness: There is no abdominal tenderness.  Musculoskeletal:     Cervical back: Normal range of motion and neck  supple.     Right lower leg: No tenderness. No edema.     Left lower leg: No tenderness. No edema.  Skin:    General: Skin is warm and dry.  Neurological:     General: No focal deficit present.     Mental Status: He is alert and oriented to person, place, and time.  Psychiatric:        Mood and Affect: Mood normal.        Behavior: Behavior normal.     Assessment & Plan:  1: Chronic heart failure with reduced ejection fraction- - NYHA class III - euvolemic today - weighing daily; reminded to call for an overnight weight gain of >2 pounds or a weekly weight gain of >5 pounds - weight stable from last visit here 6 weeks ago - not adding salt and they are reviewing food labels for sodium content; reviewed the importance of closely following a low sodium diet  - saw cardiology (Paraschos) 06/03/19 & returns next month (August) - entresto increased to 49/51mg  BID at last visit; discussed titrating up at his next  visit - check BMP today - BNP 03/02/19 was 990.0 - has received both his COVID vaccines   2: HTN- - BP mildly elevated today; instructed to stop the OTC supplement called force factor - saw PCP ([name]) 06/05/19 - BMP 06/16/19 reviewed and showed sodium 140, potassium 4.0, creatinine 0.73 and GFR >60  3: COPD/ tobacco use-  - currently using nicotine patch as well as smoking 1/2 ppd of cigarettes  - complete cessation discussed for 3 minutes with him; used to smoke up to 3 ppd  4: VT- - ICD placed January 2021 - no recent shocks to report  5: 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   Return in 2 months or sooner for any questions/problems before then.

## 2019-08-17 ENCOUNTER — Other Ambulatory Visit: Payer: Self-pay

## 2019-08-17 ENCOUNTER — Ambulatory Visit: Payer: Medicaid Other | Attending: Family | Admitting: Family

## 2019-08-17 ENCOUNTER — Encounter: Payer: Self-pay | Admitting: Family

## 2019-08-17 VITALS — BP 149/87 | HR 98 | Resp 20 | Ht 72.0 in | Wt 203.1 lb

## 2019-08-17 DIAGNOSIS — I10 Essential (primary) hypertension: Secondary | ICD-10-CM

## 2019-08-17 DIAGNOSIS — Z7982 Long term (current) use of aspirin: Secondary | ICD-10-CM | POA: Diagnosis not present

## 2019-08-17 DIAGNOSIS — I472 Ventricular tachycardia, unspecified: Secondary | ICD-10-CM

## 2019-08-17 DIAGNOSIS — I110 Hypertensive heart disease with heart failure: Secondary | ICD-10-CM | POA: Insufficient documentation

## 2019-08-17 DIAGNOSIS — B1920 Unspecified viral hepatitis C without hepatic coma: Secondary | ICD-10-CM | POA: Insufficient documentation

## 2019-08-17 DIAGNOSIS — J449 Chronic obstructive pulmonary disease, unspecified: Secondary | ICD-10-CM | POA: Diagnosis not present

## 2019-08-17 DIAGNOSIS — B1910 Unspecified viral hepatitis B without hepatic coma: Secondary | ICD-10-CM | POA: Diagnosis not present

## 2019-08-17 DIAGNOSIS — Z7951 Long term (current) use of inhaled steroids: Secondary | ICD-10-CM | POA: Diagnosis not present

## 2019-08-17 DIAGNOSIS — E785 Hyperlipidemia, unspecified: Secondary | ICD-10-CM | POA: Insufficient documentation

## 2019-08-17 DIAGNOSIS — F17210 Nicotine dependence, cigarettes, uncomplicated: Secondary | ICD-10-CM | POA: Insufficient documentation

## 2019-08-17 DIAGNOSIS — B182 Chronic viral hepatitis C: Secondary | ICD-10-CM

## 2019-08-17 DIAGNOSIS — M4800 Spinal stenosis, site unspecified: Secondary | ICD-10-CM | POA: Diagnosis not present

## 2019-08-17 DIAGNOSIS — F329 Major depressive disorder, single episode, unspecified: Secondary | ICD-10-CM | POA: Insufficient documentation

## 2019-08-17 DIAGNOSIS — I5022 Chronic systolic (congestive) heart failure: Secondary | ICD-10-CM | POA: Diagnosis present

## 2019-08-17 DIAGNOSIS — K7460 Unspecified cirrhosis of liver: Secondary | ICD-10-CM | POA: Insufficient documentation

## 2019-08-17 DIAGNOSIS — Z79899 Other long term (current) drug therapy: Secondary | ICD-10-CM | POA: Diagnosis not present

## 2019-08-17 DIAGNOSIS — G8929 Other chronic pain: Secondary | ICD-10-CM | POA: Diagnosis not present

## 2019-08-17 DIAGNOSIS — Z95810 Presence of automatic (implantable) cardiac defibrillator: Secondary | ICD-10-CM | POA: Diagnosis not present

## 2019-08-17 LAB — BASIC METABOLIC PANEL
Anion gap: 7 (ref 5–15)
BUN: 20 mg/dL (ref 6–20)
CO2: 27 mmol/L (ref 22–32)
Calcium: 9.2 mg/dL (ref 8.9–10.3)
Chloride: 101 mmol/L (ref 98–111)
Creatinine, Ser: 0.96 mg/dL (ref 0.61–1.24)
GFR calc Af Amer: >60 mL/min (ref >60)
GFR calc non Af Amer: >60 mL/min (ref >60)
Glucose, Bld: 118 mg/dL — ABNORMAL HIGH (ref 70–99)
Potassium: 4.1 mmol/L (ref 3.5–5.1)
Sodium: 135 mmol/L (ref 135–145)

## 2019-08-17 MED ORDER — ATORVASTATIN CALCIUM 80 MG PO TABS: 80.0000 mg | ORAL_TABLET | Freq: Every day | ORAL | Status: DC

## 2019-08-17 MED ORDER — SACUBITRIL-VALSARTAN 49-51 MG PO TABS: 1.0000 | ORAL_TABLET | Freq: Two times a day (BID) | ORAL | 5 refills | Status: DC

## 2019-08-17 MED ORDER — ATORVASTATIN CALCIUM 80 MG PO TABS: 80.0000 mg | ORAL_TABLET | Freq: Every day | ORAL | 3 refills | Status: DC

## 2019-08-17 MED ORDER — AMIODARONE HCL 400 MG PO TABS: 400.0000 mg | ORAL_TABLET | Freq: Every day | ORAL | 3 refills | Status: DC

## 2019-08-17 MED ORDER — SPIRONOLACTONE 25 MG PO TABS: 25.0000 mg | ORAL_TABLET | Freq: Every day | ORAL | 3 refills | Status: DC

## 2019-08-17 NOTE — Patient Instructions (Signed)
Continue weighing daily and call for an overnight weight gain of > 2 pounds or a weekly weight gain of >5 pounds. 

## 2019-09-17 ENCOUNTER — Ambulatory Visit: Payer: Medicaid Other | Admitting: Family

## 2019-10-20 NOTE — Progress Notes (Signed)
Patient ID: Brent [name], male    DOB: 02-07-1960, 59 y.o.   MRN: [id number]  HPI  Mr Brent [name] is a 59 y/o male with a history of hyperlipidemia, HTN, COPD, chronic pain, hepatitis C, depression, spinal stenosis, subarachnoid hemorrhage, current tobacco use and chronic heart failure.   Echo report from 03/01/19 reviewed and showed an EF of 25-30% along with moderately elevated PA pressure.   Catheterization report from 03/04/19 showed:  Mid LAD lesion is 65% stenosed.  Mid Cx lesion is 100% stenosed.  Prox RCA to Mid RCA lesion is 30% stenosed.  Has not been admitted or been in the ED in the last 6 months.   Patient presents today for a follow-up visit with a chief complaint of moderate fatigue upon minimal exertion. He describes this as chronic in nature having been present for several years. He has associated shortness of breath, numbness/weakness in left lower leg, chronic back pain, depression and difficulty sleeping along with this. He denies any dizziness, abdominal distention, palpitations, pedal edema, chest pain, wheezing, cough or weight gain.   Was recently started on isosorbide due to chest tightness/pain and those symptoms have since resolved. He takes it at night because it does give him a headache otherwise.     Past Medical History:  Diagnosis Date  . CHF (congestive heart failure) (HCC)   . Chronic pain   . COPD (chronic obstructive pulmonary disease) (HCC)   . Depression   . Hepatitis C   . Hyperlipidemia   . Hypertension   . Spinal stenosis   . Subarachnoid hemorrhage [facility])    Past Surgical History:  Procedure Laterality Date  . LEFT HEART CATH AND CORONARY ANGIOGRAPHY N/A 03/04/2019   Procedure: LEFT HEART CATH AND CORONARY ANGIOGRAPHY;  Surgeon: Lamar Blinks, MD;  Location: [facility] INVASIVE CV LAB;  Service: Cardiovascular;  Laterality: N/A;   No family history on file. Social History   Tobacco Use  . Smoking status: Current Every Day Smoker    Packs/day:  2.00    Types: Cigarettes  . Smokeless tobacco: Never Used  Substance Use Topics  . Alcohol use: Not Currently   No Active Allergies  Prior to Admission medications   Medication Sig Start Date End Date Taking? Authorizing Provider  albuterol (VENTOLIN HFA) 108 (90 Base) MCG/ACT inhaler Inhale 2 puffs into the lungs every 6 (six) hours as needed. 06/05/19 06/04/20 Yes [provider]  amiodarone (PACERONE) 400 MG tablet Take 1 tablet (400 mg total) by mouth daily. 08/17/19  Yes Clarisa Kindred A, FNP  aspirin EC 81 MG EC tablet Take 1 tablet (81 mg total) by mouth daily. 03/06/19  Yes Danford, Earl Lites, MD  atorvastatin (LIPITOR) 80 MG tablet Take 1 tablet (80 mg total) by mouth daily at 6 PM. 08/17/19  Yes Kamalei Roeder, Jarold Song, FNP  budesonide-formoterol (SYMBICORT) 160-4.5 MCG/ACT inhaler Inhale 2 puffs into the lungs 2 (two) times daily. 06/05/19 06/04/20 Yes [provider]  carvedilol (COREG) 3.125 MG tablet Take 1 tablet (3.125 mg total) by mouth 2 (two) times daily with a meal. 08/14/19  Yes Suleika Donavan A, FNP  cyclobenzaprine (FLEXERIL) 10 MG tablet Take 5-10 mg by mouth 2 (two) times daily.   Yes [provider]  gabapentin (NEURONTIN) 300 MG capsule Take 300 mg by mouth 2 (two) times daily.   Yes [provider]  isosorbide mononitrate (IMDUR) 30 MG 24 hr tablet Take 30 mg by mouth daily.   Yes [provider]  naproxen  sodium (ALEVE) 220 MG tablet Take 220 mg by mouth daily as needed.   Yes [provider]  nicotine (NICODERM CQ - DOSED IN MG/24 HOURS) 21 mg/24hr patch Place 1 patch (21 mg total) onto the skin daily. 07/13/19  Yes Noach Calvillo, Inetta Fermo A, FNP  nitroGLYCERIN (NITROSTAT) 0.4 MG SL tablet Place 1 tablet (0.4 mg total) under the tongue every 5 (five) minutes x 3 doses as needed for chest pain. 03/05/19  Yes Danford, Earl Lites, MD  oxyCODONE (ROXICODONE) 15 MG immediate release tablet Take 1 tablet (15 mg total) by mouth every 4 (four)  hours as needed for moderate pain. 03/05/19  Yes Danford, Earl Lites, MD  sacubitril-valsartan (ENTRESTO) 49-51 MG Take 1 tablet by mouth 2 (two) times daily. 08/17/19  Yes Clarisa Kindred A, FNP  spironolactone (ALDACTONE) 25 MG tablet Take 1 tablet (25 mg total) by mouth daily. 08/17/19  Yes Carlee Vonderhaar A, FNP  buPROPion (WELLBUTRIN XL) 150 MG 24 hr tablet Take 150 mg by mouth daily. Patient not taking: Reported on 08/17/2019 06/05/19 06/04/20  [provider]     Review of Systems  Constitutional: Positive for fatigue (tire easily). Negative for appetite change.  HENT: Negative for congestion, postnasal drip and sore throat.   Eyes: Positive for visual disturbance.  Respiratory: Positive for shortness of breath. Negative for cough and wheezing.   Cardiovascular: Negative for chest pain, palpitations and leg swelling.  Gastrointestinal: Negative for abdominal distention and abdominal pain.  Endocrine: Negative.   Genitourinary: Negative.   Musculoskeletal: Positive for arthralgias (left shoulder pain) and back pain.  Skin: Negative.   Allergic/Immunologic: Negative.   Neurological: Positive for weakness (left lower leg) and numbness (left lower leg). Negative for dizziness and light-headedness.  Hematological: Negative for adenopathy. Does not bruise/bleed easily.  Psychiatric/Behavioral: Positive for dysphoric mood and sleep disturbance (sleeping on 2 pillows). The patient is not nervous/anxious.    Vitals:   10/21/19 1011  BP: 140/90  Pulse: 73  Resp: 18  SpO2: 100%  Weight: 211 lb 6 oz (95.9 kg)  Height: 6' (1.829 m)   Wt Readings from Last 3 Encounters:  10/21/19 211 lb 6 oz (95.9 kg)  08/17/19 203 lb 2 oz (92.1 kg)  06/30/19 203 lb (92.1 kg)   Lab Results  Component Value Date   CREATININE 0.96 08/17/2019   CREATININE 0.73 06/16/2019   CREATININE 0.62 03/05/2019    Physical Exam Vitals and nursing note reviewed.  Constitutional:      Appearance: He is  well-developed.  HENT:     Head: Normocephalic and atraumatic.  Neck:     Vascular: No JVD.  Cardiovascular:     Rate and Rhythm: Normal rate and regular rhythm.  Pulmonary:     Effort: Pulmonary effort is normal. No respiratory distress.     Breath sounds: No wheezing, rhonchi or rales.  Abdominal:     Palpations: Abdomen is soft.     Tenderness: There is no abdominal tenderness.  Musculoskeletal:     Cervical back: Normal range of motion and neck supple.     Right lower leg: No tenderness. No edema.     Left lower leg: No tenderness. No edema.  Skin:    General: Skin is warm and dry.  Neurological:     General: No focal deficit present.     Mental Status: He is alert and oriented to person, place, and time.  Psychiatric:        Mood and Affect: Mood normal.  Behavior: Behavior normal.     Assessment & Plan:  1: Chronic heart failure with reduced ejection fraction- - NYHA class III - euvolemic today - weighing daily; reminded to call for an overnight weight gain of >2 pounds or a weekly weight gain of >5 pounds - weight up 8 pounds from last visit here 2.5 months ago - not adding salt and they are reviewing food labels for sodium content; reviewed the importance of closely following a low sodium diet  - saw cardiology [name]) 10/08/19 & isosorbide 30mg  was started - will increase entresto to 97/103mg  BID; advised him that he could finish his current bottle by taking 2 tablets BID until gone and then when he picks up the new RX, to resume taking 1 tablet BID - will get BMP next visit - discussed titrating up carvedilol next time or adding farxiga - BNP 03/02/19 was 990.0 - has received both his COVID vaccines   2: HTN- - BP mildly elevated but he hasn't taken any of his medications yet today - saw PCP ([name]) 06/05/19 - BMP 10/16/19 reviewed and showed sodium 138, potassium 4.3, creatinine 1.0 and GFR 83  3: COPD/ tobacco use-  - currently using nicotine patch as well  as smoking 1/2 ppd of cigarettes; advised that he should stop the patches if he's going to continue to smoke - says that he recently stopped for 3 days but was so irritable, he resumed smoking; encouraged him to f/u with PCP about other alternatives - complete cessation discussed for 3 minutes with him; used to smoke up to 3 ppd  4: VT- - ICD placed January 2021 - no recent shocks to report  5: 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 Each medication was verbally reviewed with the patient and he was encouraged to bring the bottles to every visit to confirm accuracy of list.  Return in 1 month or sooner for any questions/problems before then.

## 2019-10-21 ENCOUNTER — Ambulatory Visit: Payer: Medicaid Other | Attending: Family | Admitting: Family

## 2019-10-21 ENCOUNTER — Other Ambulatory Visit: Payer: Self-pay

## 2019-10-21 ENCOUNTER — Encounter: Payer: Self-pay | Admitting: Family

## 2019-10-21 VITALS — BP 140/90 | HR 73 | Resp 18 | Ht 72.0 in | Wt 211.4 lb

## 2019-10-21 DIAGNOSIS — F17210 Nicotine dependence, cigarettes, uncomplicated: Secondary | ICD-10-CM | POA: Insufficient documentation

## 2019-10-21 DIAGNOSIS — I5022 Chronic systolic (congestive) heart failure: Secondary | ICD-10-CM | POA: Insufficient documentation

## 2019-10-21 DIAGNOSIS — I10 Essential (primary) hypertension: Secondary | ICD-10-CM

## 2019-10-21 DIAGNOSIS — B182 Chronic viral hepatitis C: Secondary | ICD-10-CM

## 2019-10-21 DIAGNOSIS — F329 Major depressive disorder, single episode, unspecified: Secondary | ICD-10-CM | POA: Insufficient documentation

## 2019-10-21 DIAGNOSIS — R200 Anesthesia of skin: Secondary | ICD-10-CM | POA: Diagnosis not present

## 2019-10-21 DIAGNOSIS — Z7982 Long term (current) use of aspirin: Secondary | ICD-10-CM | POA: Diagnosis not present

## 2019-10-21 DIAGNOSIS — Z8619 Personal history of other infectious and parasitic diseases: Secondary | ICD-10-CM | POA: Diagnosis not present

## 2019-10-21 DIAGNOSIS — Z95810 Presence of automatic (implantable) cardiac defibrillator: Secondary | ICD-10-CM | POA: Insufficient documentation

## 2019-10-21 DIAGNOSIS — R5383 Other fatigue: Secondary | ICD-10-CM | POA: Insufficient documentation

## 2019-10-21 DIAGNOSIS — G8929 Other chronic pain: Secondary | ICD-10-CM | POA: Insufficient documentation

## 2019-10-21 DIAGNOSIS — J449 Chronic obstructive pulmonary disease, unspecified: Secondary | ICD-10-CM | POA: Diagnosis not present

## 2019-10-21 DIAGNOSIS — I110 Hypertensive heart disease with heart failure: Secondary | ICD-10-CM | POA: Insufficient documentation

## 2019-10-21 DIAGNOSIS — R531 Weakness: Secondary | ICD-10-CM | POA: Insufficient documentation

## 2019-10-21 DIAGNOSIS — R0789 Other chest pain: Secondary | ICD-10-CM | POA: Diagnosis not present

## 2019-10-21 DIAGNOSIS — E785 Hyperlipidemia, unspecified: Secondary | ICD-10-CM | POA: Diagnosis not present

## 2019-10-21 DIAGNOSIS — Z7901 Long term (current) use of anticoagulants: Secondary | ICD-10-CM | POA: Diagnosis not present

## 2019-10-21 DIAGNOSIS — M25512 Pain in left shoulder: Secondary | ICD-10-CM | POA: Insufficient documentation

## 2019-10-21 DIAGNOSIS — M549 Dorsalgia, unspecified: Secondary | ICD-10-CM | POA: Insufficient documentation

## 2019-10-21 DIAGNOSIS — Z79899 Other long term (current) drug therapy: Secondary | ICD-10-CM | POA: Insufficient documentation

## 2019-10-21 DIAGNOSIS — R0602 Shortness of breath: Secondary | ICD-10-CM | POA: Insufficient documentation

## 2019-10-21 DIAGNOSIS — K7460 Unspecified cirrhosis of liver: Secondary | ICD-10-CM | POA: Insufficient documentation

## 2019-10-21 DIAGNOSIS — I472 Ventricular tachycardia, unspecified: Secondary | ICD-10-CM

## 2019-10-21 MED ORDER — SACUBITRIL-VALSARTAN 97-103 MG PO TABS: 1.0000 | ORAL_TABLET | Freq: Two times a day (BID) | ORAL | 5 refills | Status: DC

## 2019-10-21 NOTE — Patient Instructions (Addendum)
Continue weighing daily and call for an overnight weight gain of > 2 pounds or a weekly weight gain of >5 pounds.   Finish your current entresto by taking 2 tablets twice a day until gone. When you pick up your new prescription, you will resume taking 1 tablet twice a day

## 2019-11-19 NOTE — Progress Notes (Signed)
Patient ID: Brent [name], male    DOB: Jan 21, 1961, 59 y.o.   MRN: [id number]  HPI  Mr Fitzmaurice is a 59 y/o male with a history of hyperlipidemia, HTN, COPD, chronic pain, hepatitis C, depression, spinal stenosis, subarachnoid hemorrhage, current tobacco use and chronic heart failure.   Echo report from 03/01/19 reviewed and showed an EF of 25-30% along with moderately elevated PA pressure.   Catheterization report from 03/04/19 showed:  Mid LAD lesion is 65% stenosed.  Mid Cx lesion is 100% stenosed.  Prox RCA to Mid RCA lesion is 30% stenosed.  Has not been admitted or been in the ED in the last 6 months.   Patient presents today for a follow-up visit with a chief complaint of moderate fatigue upon minimal exertion. He describes this as chronic in nature having been present for several years. He has associated shortness of breath, intermittent chest pain, depression, leg weakness, chronic pain and difficulty sleeping along with this.  He denies any dizziness, abdominal distention, palpitations, pedal edema, cough or weight gain.    Scheduled for PFT's next week. Says that he's been smoking more and is now up to 3/4 ppd of cigarettes.   Past Medical History:  Diagnosis Date  . CHF (congestive heart failure) (HCC)   . Chronic pain   . COPD (chronic obstructive pulmonary disease) (HCC)   . Depression   . Hepatitis C   . Hyperlipidemia   . Hypertension   . Spinal stenosis   . Subarachnoid hemorrhage [facility])    Past Surgical History:  Procedure Laterality Date  . LEFT HEART CATH AND CORONARY ANGIOGRAPHY N/A 03/04/2019   Procedure: LEFT HEART CATH AND CORONARY ANGIOGRAPHY;  Surgeon: Lamar Blinks, MD;  Location: [facility] INVASIVE CV LAB;  Service: Cardiovascular;  Laterality: N/A;   No family history on file. Social History   Tobacco Use  . Smoking status: Current Every Day Smoker    Packs/day: 2.00    Types: Cigarettes  . Smokeless tobacco: Never Used  Substance Use Topics  .  Alcohol use: Not Currently   No Active Allergies  Prior to Admission medications   Medication Sig Start Date End Date Taking? Authorizing Provider  albuterol (VENTOLIN HFA) 108 (90 Base) MCG/ACT inhaler Inhale 2 puffs into the lungs every 6 (six) hours as needed. 06/05/19 06/04/20 Yes [provider]  amiodarone (PACERONE) 400 MG tablet Take 1 tablet (400 mg total) by mouth daily. 08/17/19  Yes Clarisa Kindred A, FNP  aspirin EC 81 MG EC tablet Take 1 tablet (81 mg total) by mouth daily. 03/06/19  Yes Danford, Earl Lites, MD  atorvastatin (LIPITOR) 80 MG tablet Take 1 tablet (80 mg total) by mouth daily at 6 PM. 08/17/19  Yes Jaydrien Wassenaar, Jarold Song, FNP  budesonide-formoterol (SYMBICORT) 160-4.5 MCG/ACT inhaler Inhale 2 puffs into the lungs 2 (two) times daily. 06/05/19 06/04/20 Yes [provider]  carvedilol (COREG) 3.125 MG tablet Take 1 tablet (3.125 mg total) by mouth 2 (two) times daily with a meal. 08/14/19  Yes Jerrid Forgette A, FNP  cyclobenzaprine (FLEXERIL) 10 MG tablet Take 5-10 mg by mouth 2 (two) times daily.   Yes [provider]  DULoxetine (CYMBALTA) 30 MG capsule Take 30 mg by mouth daily.   Yes [provider]  gabapentin (NEURONTIN) 300 MG capsule Take 300 mg by mouth 2 (two) times daily.   Yes [provider]  isosorbide mononitrate (IMDUR) 30 MG 24 hr tablet Take 30 mg by mouth daily.  Yes [provider]  naproxen sodium (ALEVE) 220 MG tablet Take 220 mg by mouth daily as needed.   Yes [provider]  oxyCODONE (ROXICODONE) 15 MG immediate release tablet Take 1 tablet (15 mg total) by mouth every 4 (four) hours as needed for moderate pain. 03/05/19  Yes Danford, Earl Lites, MD  sacubitril-valsartan (ENTRESTO) 97-103 MG Take 1 tablet by mouth 2 (two) times daily. 10/21/19  Yes Clarisa Kindred A, FNP  spironolactone (ALDACTONE) 25 MG tablet Take 1 tablet (25 mg total) by mouth daily. 08/17/19  Yes Suraya Vidrine A, FNP  nicotine  (NICODERM CQ - DOSED IN MG/24 HOURS) 21 mg/24hr patch Place 1 patch (21 mg total) onto the skin daily. Patient not taking: Reported on 11/20/2019 07/13/19   Delma Freeze, FNP  nitroGLYCERIN (NITROSTAT) 0.4 MG SL tablet Place 1 tablet (0.4 mg total) under the tongue every 5 (five) minutes x 3 doses as needed for chest pain. 03/05/19   Danford, Earl Lites, MD     Review of Systems  Constitutional: Positive for fatigue (tire easily). Negative for appetite change.  HENT: Negative for congestion, postnasal drip and sore throat.   Eyes: Positive for visual disturbance.  Respiratory: Positive for shortness of breath. Negative for cough and wheezing.   Cardiovascular: Positive for chest pain ("very little"). Negative for palpitations and leg swelling.  Gastrointestinal: Negative for abdominal distention and abdominal pain.  Endocrine: Negative.   Genitourinary: Negative.   Musculoskeletal: Positive for arthralgias (left shoulder pain) and back pain.  Skin: Negative.   Allergic/Immunologic: Negative.   Neurological: Positive for weakness (left lower leg) and numbness (left lower leg). Negative for dizziness and light-headedness.  Hematological: Negative for adenopathy. Does not bruise/bleed easily.  Psychiatric/Behavioral: Positive for dysphoric mood and sleep disturbance (sleeping on 2 pillows). The patient is not nervous/anxious.    Vitals:   11/20/19 0854  BP: 123/73  Pulse: 66  Resp: 20  SpO2: 99%  Weight: 210 lb 2 oz (95.3 kg)  Height: 6' (1.829 m)   Wt Readings from Last 3 Encounters:  11/20/19 210 lb 2 oz (95.3 kg)  10/21/19 211 lb 6 oz (95.9 kg)  08/17/19 203 lb 2 oz (92.1 kg)   Lab Results  Component Value Date   CREATININE 0.96 08/17/2019   CREATININE 0.73 06/16/2019   CREATININE 0.62 03/05/2019     Physical Exam Vitals and nursing note reviewed.  Constitutional:      Appearance: He is well-developed.  HENT:     Head: Normocephalic and atraumatic.  Neck:      Vascular: No JVD.  Cardiovascular:     Rate and Rhythm: Normal rate and regular rhythm.  Pulmonary:     Effort: Pulmonary effort is normal. No respiratory distress.     Breath sounds: Wheezing present. No rhonchi or rales.  Abdominal:     Palpations: Abdomen is soft.     Tenderness: There is no abdominal tenderness.  Musculoskeletal:     Cervical back: Normal range of motion and neck supple.     Right lower leg: No tenderness. No edema.     Left lower leg: No tenderness. No edema.  Skin:    General: Skin is warm and dry.  Neurological:     General: No focal deficit present.     Mental Status: He is alert and oriented to person, place, and time.  Psychiatric:        Mood and Affect: Mood normal.  Behavior: Behavior normal.     Assessment & Plan:  1: Chronic heart failure with reduced ejection fraction- - NYHA class III - euvolemic today - weighing daily; reminded to call for an overnight weight gain of >2 pounds or a weekly weight gain of >5 pounds - weight down 1 pound from last visit here 1 month ago - not adding salt and they are reviewing food labels for sodium content; reviewed the importance of closely following a low sodium diet  - saw cardiology [name]) 11/06/19 - entresto increased to 97/103mg  BID at last visit - will get BMP today - HR in the 60's so will not titrate carvedilol today; discussed adding farxiga/ jardiance but will get lab work back first - BNP 03/02/19 was 990.0 - has received both his COVID vaccines   2: HTN- - BP looks good today - saw PCP ([name]) 06/05/19 - BMP 10/16/19 reviewed and showed sodium 138, potassium 4.3, creatinine 1.0 and GFR 83  3: COPD/ tobacco use-  - no longer wearing nicotine patch; now smoking 3/4 ppd of cigarettes - 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   Return in 1 month or sooner for any questions/problems before then.

## 2019-11-20 ENCOUNTER — Ambulatory Visit: Payer: Medicaid Other | Attending: Family | Admitting: Family

## 2019-11-20 ENCOUNTER — Other Ambulatory Visit: Payer: Self-pay

## 2019-11-20 ENCOUNTER — Encounter: Payer: Self-pay | Admitting: Family

## 2019-11-20 ENCOUNTER — Telehealth: Payer: Self-pay | Admitting: Family

## 2019-11-20 VITALS — BP 123/73 | HR 66 | Resp 20 | Ht 72.0 in | Wt 210.1 lb

## 2019-11-20 DIAGNOSIS — B1920 Unspecified viral hepatitis C without hepatic coma: Secondary | ICD-10-CM | POA: Insufficient documentation

## 2019-11-20 DIAGNOSIS — B1910 Unspecified viral hepatitis B without hepatic coma: Secondary | ICD-10-CM | POA: Diagnosis not present

## 2019-11-20 DIAGNOSIS — I110 Hypertensive heart disease with heart failure: Secondary | ICD-10-CM | POA: Insufficient documentation

## 2019-11-20 DIAGNOSIS — E785 Hyperlipidemia, unspecified: Secondary | ICD-10-CM | POA: Diagnosis not present

## 2019-11-20 DIAGNOSIS — Z79899 Other long term (current) drug therapy: Secondary | ICD-10-CM | POA: Insufficient documentation

## 2019-11-20 DIAGNOSIS — Z7982 Long term (current) use of aspirin: Secondary | ICD-10-CM | POA: Diagnosis not present

## 2019-11-20 DIAGNOSIS — K7460 Unspecified cirrhosis of liver: Secondary | ICD-10-CM | POA: Diagnosis not present

## 2019-11-20 DIAGNOSIS — I472 Ventricular tachycardia, unspecified: Secondary | ICD-10-CM

## 2019-11-20 DIAGNOSIS — Z95810 Presence of automatic (implantable) cardiac defibrillator: Secondary | ICD-10-CM | POA: Insufficient documentation

## 2019-11-20 DIAGNOSIS — M25512 Pain in left shoulder: Secondary | ICD-10-CM | POA: Diagnosis not present

## 2019-11-20 DIAGNOSIS — F17210 Nicotine dependence, cigarettes, uncomplicated: Secondary | ICD-10-CM | POA: Insufficient documentation

## 2019-11-20 DIAGNOSIS — J449 Chronic obstructive pulmonary disease, unspecified: Secondary | ICD-10-CM

## 2019-11-20 DIAGNOSIS — Z7951 Long term (current) use of inhaled steroids: Secondary | ICD-10-CM | POA: Insufficient documentation

## 2019-11-20 DIAGNOSIS — I5022 Chronic systolic (congestive) heart failure: Secondary | ICD-10-CM | POA: Insufficient documentation

## 2019-11-20 DIAGNOSIS — B182 Chronic viral hepatitis C: Secondary | ICD-10-CM

## 2019-11-20 DIAGNOSIS — I10 Essential (primary) hypertension: Secondary | ICD-10-CM

## 2019-11-20 DIAGNOSIS — F329 Major depressive disorder, single episode, unspecified: Secondary | ICD-10-CM | POA: Insufficient documentation

## 2019-11-20 DIAGNOSIS — G8929 Other chronic pain: Secondary | ICD-10-CM | POA: Diagnosis not present

## 2019-11-20 LAB — BASIC METABOLIC PANEL
Anion gap: 8 (ref 5–15)
BUN: 20 mg/dL (ref 6–20)
CO2: 29 mmol/L (ref 22–32)
Calcium: 8.7 mg/dL — ABNORMAL LOW (ref 8.9–10.3)
Chloride: 99 mmol/L (ref 98–111)
Creatinine, Ser: 0.9 mg/dL (ref 0.61–1.24)
GFR, Estimated: >60 mL/min (ref >60)
Glucose, Bld: 111 mg/dL — ABNORMAL HIGH (ref 70–99)
Potassium: 4.6 mmol/L (ref 3.5–5.1)
Sodium: 136 mmol/L (ref 135–145)

## 2019-11-20 MED ORDER — EMPAGLIFLOZIN 10 MG PO TABS
10.0000 mg | ORAL_TABLET | Freq: Every day | ORAL | 5 refills | Status: DC
Start: 2019-11-20 — End: 2019-12-21

## 2019-11-20 NOTE — Telephone Encounter (Signed)
Advised patient of BMP results from earlier today. Potassium and renal function are all normal.  Will begin jardiance 10mg  once daily. RX sent to patient's pharmacy.

## 2019-11-20 NOTE — Patient Instructions (Addendum)
Continue weighing daily and call for an overnight weight gain of > 2 pounds or a weekly weight gain of >5 pounds.   Will call you later today or Monday to review lab work and discuss adding either farxiga or jardiance

## 2019-12-17 NOTE — Progress Notes (Signed)
Patient ID: Brent [name], male    DOB: 01-08-1961, 59 y.o.   MRN: [id number]  HPI  Brent [name] is [name] 59 y/o male with [name] history of hyperlipidemia, HTN, COPD, chronic pain, hepatitis C, depression, spinal stenosis, subarachnoid hemorrhage, current tobacco use and chronic heart failure.   Echo report from 03/01/19 reviewed and showed an EF of 25-30% along with moderately elevated PA pressure.   Catheterization report from 03/04/19 showed:  Mid LAD lesion is 65% stenosed.  Mid Cx lesion is 100% stenosed.  Prox RCA to Mid RCA lesion is 30% stenosed.  Has not been admitted or been in the ED in the last 6 months.   Patient presents today for [name] follow-up visit with [name] chief complaint of moderate fatigue with little exertion. He describes this as chronic in nature having been present for several years. He has associated shortness of breath, weakness/ numbness in left leg, chronic pain and chronic difficulty sleeping along with this. He denies any dizziness, abdominal distention, palpitations, pedal edema, chest pain, wheezing, cough or weight gain.   Tolerating jardiance without known side effects. Says that he's had PFT's done since last here. Returns to cardiology first of the year.   Past Medical History:  Diagnosis Date  . CHF (congestive heart failure) (HCC)   . Chronic pain   . COPD (chronic obstructive pulmonary disease) (HCC)   . Depression   . Hepatitis C   . Hyperlipidemia   . Hypertension   . Spinal stenosis   . Subarachnoid hemorrhage [facility])    Past Surgical History:  Procedure Laterality Date  . LEFT HEART CATH AND CORONARY ANGIOGRAPHY N/[name] 03/04/2019   Procedure: LEFT HEART CATH AND CORONARY ANGIOGRAPHY;  Surgeon: Lamar Blinks, MD;  Location: [facility] INVASIVE CV LAB;  Service: Cardiovascular;  Laterality: N/[name];   No family history on file. Social History   Tobacco Use  . Smoking status: Current Every Day Smoker    Packs/day: 2.00    Types: Cigarettes  . Smokeless tobacco:  Never Used  Substance Use Topics  . Alcohol use: Not Currently   No Active Allergies  Prior to Admission medications   Medication Sig Start Date End Date Taking? Authorizing Provider  albuterol (VENTOLIN HFA) 108 (90 Base) MCG/ACT inhaler Inhale 2 puffs into the lungs every 6 (six) hours as needed. 06/05/19 06/04/20 Yes [provider]  amiodarone (PACERONE) 400 MG tablet Take 1 tablet (400 mg total) by mouth daily. 08/17/19  Yes Brent Kindred [name], Brent [name]  aspirin EC 81 MG EC tablet Take 1 tablet (81 mg total) by mouth daily. 03/06/19  Yes Danford, Earl Lites, MD  atorvastatin (LIPITOR) 80 MG tablet Take 1 tablet (80 mg total) by mouth daily at 6 PM. 08/17/19  Yes Brent [name], Brent Song, Brent [name]  budesonide-formoterol (SYMBICORT) 160-4.5 MCG/ACT inhaler Inhale 2 puffs into the lungs 2 (two) times daily. 06/05/19 06/04/20 Yes [provider]  carvedilol (COREG) 3.125 MG tablet Take 1 tablet (3.125 mg total) by mouth 2 (two) times daily with [name] meal. 12/21/19  Yes Brent Alguire [name], Brent [name]  cyclobenzaprine (FLEXERIL) 10 MG tablet Take 5-10 mg by mouth 2 (two) times daily.   Yes [provider]  DULoxetine (CYMBALTA) 30 MG capsule Take 30 mg by mouth daily.   Yes [provider]  empagliflozin (JARDIANCE) 10 MG TABS tablet Take 1 tablet (10 mg total) by mouth daily before breakfast. 12/21/19  Yes Brent Hilburn [name], Brent [name]  gabapentin (NEURONTIN) 300 MG capsule Take 300 mg by  mouth 2 (two) times daily.   Yes [provider]  isosorbide mononitrate (IMDUR) 30 MG 24 hr tablet Take 30 mg by mouth daily.   Yes [provider]  naproxen sodium (ALEVE) 220 MG tablet Take 220 mg by mouth daily as needed.   Yes [provider]  nicotine (NICODERM CQ - DOSED IN MG/24 HOURS) 21 mg/24hr patch Place 1 patch (21 mg total) onto the skin daily. 07/13/19  Yes Brent [name], Brent Fermo [name], Brent [name]  nitroGLYCERIN (NITROSTAT) 0.4 MG SL tablet Place 1 tablet (0.4 mg total) under the tongue every 5 (five)  minutes x 3 doses as needed for chest pain. 03/05/19  Yes Danford, Earl Lites, MD  oxyCODONE (ROXICODONE) 15 MG immediate release tablet Take 1 tablet (15 mg total) by mouth every 4 (four) hours as needed for moderate pain. 03/05/19  Yes Danford, Earl Lites, MD  sacubitril-valsartan (ENTRESTO) 97-103 MG Take 1 tablet by mouth 2 (two) times daily. 12/21/19  Yes Brent Kindred [name], Brent [name]  spironolactone (ALDACTONE) 25 MG tablet Take 1 tablet (25 mg total) by mouth daily. 08/17/19  Yes Brent Freeze, Brent [name]    Review of Systems  Constitutional: Positive for fatigue (tire easily). Negative for appetite change.  HENT: Negative for congestion, postnasal drip and sore throat.   Eyes: Negative.   Respiratory: Positive for shortness of breath. Negative for cough and wheezing.   Cardiovascular: Negative for chest pain, palpitations and leg swelling.  Gastrointestinal: Negative for abdominal distention and abdominal pain.  Endocrine: Negative.   Genitourinary: Negative.   Musculoskeletal: Positive for arthralgias (left shoulder pain) and back pain.  Skin: Negative.   Allergic/Immunologic: Negative.   Neurological: Positive for weakness (left lower leg) and numbness (left lower leg). Negative for dizziness and light-headedness.  Hematological: Negative for adenopathy. Does not bruise/bleed easily.  Psychiatric/Behavioral: Positive for dysphoric mood and sleep disturbance (sleeping on 2 pillows). The patient is not nervous/anxious.    Vitals:   12/21/19 0850  BP: 117/75  Pulse: 74  Resp: 18  SpO2: 93%  Weight: 211 lb 8 oz (95.9 kg)  Height: 6' (1.829 m)   Wt Readings from Last 3 Encounters:  12/21/19 211 lb 8 oz (95.9 kg)  11/20/19 210 lb 2 oz (95.3 kg)  10/21/19 211 lb 6 oz (95.9 kg)   Lab Results  Component Value Date   CREATININE 0.90 11/20/2019   CREATININE 0.96 08/17/2019   CREATININE 0.73 06/16/2019     Physical Exam Vitals and nursing note reviewed.  Constitutional:       Appearance: He is well-developed.  HENT:     Head: Normocephalic and atraumatic.  Neck:     Vascular: No JVD.  Cardiovascular:     Rate and Rhythm: Normal rate and regular rhythm.  Pulmonary:     Effort: Pulmonary effort is normal. No respiratory distress.     Breath sounds: Wheezing (lower lobes) present. No rhonchi or rales.  Abdominal:     Palpations: Abdomen is soft.     Tenderness: There is no abdominal tenderness.  Musculoskeletal:     Cervical back: Normal range of motion and neck supple.     Right lower leg: No tenderness. No edema.     Left lower leg: No tenderness. No edema.  Skin:    General: Skin is warm and dry.  Neurological:     General: No focal deficit present.     Mental Status: He is alert and oriented to person, place, and time.  Psychiatric:  Mood and Affect: Mood normal.        Behavior: Behavior normal.     Assessment & Plan:  1: Chronic heart failure with reduced ejection fraction- - NYHA class III - euvolemic today - weighing daily; reminded to call for an overnight weight gain of >2 pounds or [name] weekly weight gain of >5 pounds - weight stable from last visit here 1 month ago - not adding salt and are reviewing food labels for sodium content; reviewed the importance of closely following [name] low sodium diet  - saw cardiology Brent [name]) 11/06/19 - jardiance 10mg  added at last visit - will get BMP today - due to BP, may not be able to titrate carvedilol; on GDMT of jardiance, entresto and spironolactone - BNP 03/02/19 was 990.0 - has received both his COVID vaccines   2: HTN- - BP looks good today - saw PCP (Brent [name]) 06/05/19 - BMP 11/20/19 reviewed and showed sodium 136, potassium 4.6, creatinine 0.9 and GFR >60  3: COPD/ tobacco use-  - no longer wearing nicotine patch; now smoking 3/4 ppd of cigarettes - 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   Return in 3 months or sooner for any questions/problems before then.

## 2019-12-21 ENCOUNTER — Ambulatory Visit: Payer: Medicaid Other | Attending: Family | Admitting: Family

## 2019-12-21 ENCOUNTER — Other Ambulatory Visit: Payer: Self-pay

## 2019-12-21 ENCOUNTER — Encounter: Payer: Self-pay | Admitting: Family

## 2019-12-21 VITALS — BP 117/75 | HR 74 | Resp 18 | Ht 72.0 in | Wt 211.5 lb

## 2019-12-21 DIAGNOSIS — B1910 Unspecified viral hepatitis B without hepatic coma: Secondary | ICD-10-CM | POA: Insufficient documentation

## 2019-12-21 DIAGNOSIS — G479 Sleep disorder, unspecified: Secondary | ICD-10-CM | POA: Insufficient documentation

## 2019-12-21 DIAGNOSIS — Z7951 Long term (current) use of inhaled steroids: Secondary | ICD-10-CM | POA: Insufficient documentation

## 2019-12-21 DIAGNOSIS — G5792 Unspecified mononeuropathy of left lower limb: Secondary | ICD-10-CM | POA: Diagnosis not present

## 2019-12-21 DIAGNOSIS — F32A Depression, unspecified: Secondary | ICD-10-CM | POA: Diagnosis not present

## 2019-12-21 DIAGNOSIS — J449 Chronic obstructive pulmonary disease, unspecified: Secondary | ICD-10-CM | POA: Diagnosis not present

## 2019-12-21 DIAGNOSIS — Z79899 Other long term (current) drug therapy: Secondary | ICD-10-CM | POA: Insufficient documentation

## 2019-12-21 DIAGNOSIS — I472 Ventricular tachycardia, unspecified: Secondary | ICD-10-CM

## 2019-12-21 DIAGNOSIS — M4800 Spinal stenosis, site unspecified: Secondary | ICD-10-CM | POA: Diagnosis not present

## 2019-12-21 DIAGNOSIS — E785 Hyperlipidemia, unspecified: Secondary | ICD-10-CM | POA: Diagnosis not present

## 2019-12-21 DIAGNOSIS — I110 Hypertensive heart disease with heart failure: Secondary | ICD-10-CM | POA: Insufficient documentation

## 2019-12-21 DIAGNOSIS — G8929 Other chronic pain: Secondary | ICD-10-CM | POA: Insufficient documentation

## 2019-12-21 DIAGNOSIS — B182 Chronic viral hepatitis C: Secondary | ICD-10-CM

## 2019-12-21 DIAGNOSIS — Z95810 Presence of automatic (implantable) cardiac defibrillator: Secondary | ICD-10-CM | POA: Diagnosis not present

## 2019-12-21 DIAGNOSIS — F17210 Nicotine dependence, cigarettes, uncomplicated: Secondary | ICD-10-CM | POA: Diagnosis not present

## 2019-12-21 DIAGNOSIS — Z7982 Long term (current) use of aspirin: Secondary | ICD-10-CM | POA: Diagnosis not present

## 2019-12-21 DIAGNOSIS — I5022 Chronic systolic (congestive) heart failure: Secondary | ICD-10-CM | POA: Diagnosis not present

## 2019-12-21 DIAGNOSIS — Z8679 Personal history of other diseases of the circulatory system: Secondary | ICD-10-CM | POA: Insufficient documentation

## 2019-12-21 DIAGNOSIS — Z7984 Long term (current) use of oral hypoglycemic drugs: Secondary | ICD-10-CM | POA: Diagnosis not present

## 2019-12-21 DIAGNOSIS — K7460 Unspecified cirrhosis of liver: Secondary | ICD-10-CM | POA: Diagnosis not present

## 2019-12-21 DIAGNOSIS — I10 Essential (primary) hypertension: Secondary | ICD-10-CM

## 2019-12-21 DIAGNOSIS — B1920 Unspecified viral hepatitis C without hepatic coma: Secondary | ICD-10-CM | POA: Insufficient documentation

## 2019-12-21 LAB — BASIC METABOLIC PANEL
Anion gap: 8 (ref 5–15)
BUN: 20 mg/dL (ref 6–20)
CO2: 29 mmol/L (ref 22–32)
Calcium: 8.8 mg/dL — ABNORMAL LOW (ref 8.9–10.3)
Chloride: 103 mmol/L (ref 98–111)
Creatinine, Ser: 0.96 mg/dL (ref 0.61–1.24)
GFR, Estimated: >60 mL/min (ref >60)
Glucose, Bld: 87 mg/dL (ref 70–99)
Potassium: 4.2 mmol/L (ref 3.5–5.1)
Sodium: 140 mmol/L (ref 135–145)

## 2019-12-21 MED ORDER — SACUBITRIL-VALSARTAN 97-103 MG PO TABS
1.0000 | ORAL_TABLET | Freq: Two times a day (BID) | ORAL | 3 refills | Status: DC
Start: 2019-12-21 — End: 2020-12-22

## 2019-12-21 MED ORDER — EMPAGLIFLOZIN 10 MG PO TABS
10.0000 mg | ORAL_TABLET | Freq: Every day | ORAL | 3 refills | Status: DC
Start: 2019-12-21 — End: 2020-09-27

## 2019-12-21 MED ORDER — CARVEDILOL 3.125 MG PO TABS: 3.1250 mg | ORAL_TABLET | Freq: Two times a day (BID) | ORAL | 3 refills | Status: DC

## 2019-12-21 NOTE — Patient Instructions (Signed)
Continue weighing daily and call for an overnight weight gain of > 2 pounds or a weekly weight gain of >5 pounds. 

## 2020-03-21 NOTE — Progress Notes (Signed)
Patient ID: Brent [name], male    DOB: 11/30/1960, 60 y.o.   MRN: [id number]  HPI  Brent [name] is a 60 y/o male with a history of hyperlipidemia, HTN, COPD, chronic pain, hepatitis C, depression, spinal stenosis, subarachnoid hemorrhage, current tobacco use and chronic heart failure.   Echo report from 03/01/19 reviewed and showed an EF of 25-30% along with moderately elevated PA pressure.   Catheterization report from 03/04/19 showed:  Mid LAD lesion is 65% stenosed.  Mid Cx lesion is 100% stenosed.  Prox RCA to Mid RCA lesion is 30% stenosed.  Has not been admitted or been in the ED in the last 6 months.   Patient presents today for a follow-up visit with a chief complaint of moderate fatigue with little exertion. He describes this as chronic in nature having been present for several years. He has associated shortness of breath, intermittent light-headedness, depression, chronic difficulty sleeping, numbness/ weakness in left lower leg and chronic back pain along with this. He denies any abdominal distention, palpitations, pedal edema, chest pain, wheezing, cough or weight gain.   He says that he hasn't smoked anything in the last 4 days and is really trying hard this time. Does wear nicotine patches occasionally. His cymbalta was changed to wellbutrin for his depression but he feels like it's also helping with his nicotine cravings.   Past Medical History:  Diagnosis Date  . CHF (congestive heart failure) (HCC)   . Chronic pain   . COPD (chronic obstructive pulmonary disease) (HCC)   . Depression   . Hepatitis C   . Hyperlipidemia   . Hypertension   . Spinal stenosis   . Subarachnoid hemorrhage [facility])    Past Surgical History:  Procedure Laterality Date  . LEFT HEART CATH AND CORONARY ANGIOGRAPHY N/A 03/04/2019   Procedure: LEFT HEART CATH AND CORONARY ANGIOGRAPHY;  Surgeon: Lamar Blinks, MD;  Location: [facility] INVASIVE CV LAB;  Service: Cardiovascular;  Laterality: N/A;   No  family history on file. Social History   Tobacco Use  . Smoking status: Current Every Day Smoker    Packs/day: 2.00    Types: Cigarettes  . Smokeless tobacco: Never Used  Substance Use Topics  . Alcohol use: Not Currently   No Active Allergies  Prior to Admission medications   Medication Sig Start Date End Date Taking? Authorizing Provider  albuterol (VENTOLIN HFA) 108 (90 Base) MCG/ACT inhaler Inhale 2 puffs into the lungs every 6 (six) hours as needed. 06/05/19 06/04/20 Yes [provider]  amiodarone (PACERONE) 400 MG tablet Take 1 tablet (400 mg total) by mouth daily. 08/17/19  Yes Clarisa Kindred A, FNP  aspirin EC 81 MG EC tablet Take 1 tablet (81 mg total) by mouth daily. 03/06/19  Yes Danford, Earl Lites, MD  atorvastatin (LIPITOR) 80 MG tablet Take 1 tablet (80 mg total) by mouth daily at 6 PM. 08/17/19  Yes Darey Hershberger, Jarold Song, FNP  budesonide-formoterol (SYMBICORT) 160-4.5 MCG/ACT inhaler Inhale 2 puffs into the lungs 2 (two) times daily. 06/05/19 06/04/20 Yes [provider]  buPROPion (WELLBUTRIN XL) 150 MG 24 hr tablet Take 150 mg by mouth daily.   Yes [provider]  carvedilol (COREG) 3.125 MG tablet Take 1 tablet (3.125 mg total) by mouth 2 (two) times daily with a meal. 12/21/19  Yes Ramsey Midgett A, FNP  cyclobenzaprine (FLEXERIL) 10 MG tablet Take 5-10 mg by mouth 2 (two) times daily.   Yes [provider]  empagliflozin (JARDIANCE) 10 MG TABS  tablet Take 1 tablet (10 mg total) by mouth daily before breakfast. 12/21/19  Yes Clarisa Kindred A, FNP  gabapentin (NEURONTIN) 300 MG capsule Take 300 mg by mouth 2 (two) times daily.   Yes [provider]  isosorbide mononitrate (IMDUR) 30 MG 24 hr tablet Take 30 mg by mouth daily.   Yes [provider]  naproxen sodium (ALEVE) 220 MG tablet Take 220 mg by mouth daily as needed.   Yes [provider]  nicotine (NICODERM CQ - DOSED IN MG/24 HOURS) 21 mg/24hr patch Place 1 patch  (21 mg total) onto the skin daily. 07/13/19  Yes Kimball Manske, Inetta Fermo A, FNP  nitroGLYCERIN (NITROSTAT) 0.4 MG SL tablet Place 1 tablet (0.4 mg total) under the tongue every 5 (five) minutes x 3 doses as needed for chest pain. 03/05/19  Yes Danford, Earl Lites, MD  oxyCODONE (ROXICODONE) 15 MG immediate release tablet Take 1 tablet (15 mg total) by mouth every 4 (four) hours as needed for moderate pain. 03/05/19  Yes Danford, Earl Lites, MD  sacubitril-valsartan (ENTRESTO) 97-103 MG Take 1 tablet by mouth 2 (two) times daily. 12/21/19  Yes Clarisa Kindred A, FNP  spironolactone (ALDACTONE) 25 MG tablet Take 1 tablet (25 mg total) by mouth daily. 08/17/19  Yes Delma Freeze, FNP    Review of Systems  Constitutional: Positive for fatigue (tire easily). Negative for appetite change.  HENT: Negative for congestion, postnasal drip and sore throat.   Eyes: Negative.   Respiratory: Positive for shortness of breath. Negative for cough and wheezing.   Cardiovascular: Negative for chest pain, palpitations and leg swelling.  Gastrointestinal: Negative for abdominal distention and abdominal pain.  Endocrine: Negative.   Genitourinary: Negative.   Musculoskeletal: Positive for arthralgias (left shoulder pain) and back pain.  Skin: Negative.   Allergic/Immunologic: Negative.   Neurological: Positive for weakness (left lower leg), light-headedness (last couple of days) and numbness (left lower leg). Negative for dizziness.  Hematological: Negative for adenopathy. Does not bruise/bleed easily.  Psychiatric/Behavioral: Positive for dysphoric mood and sleep disturbance (sleeping on 2 pillows). The patient is not nervous/anxious.    Vitals:   03/22/20 0855  BP: (!) 143/91  Pulse: 82  Resp: 18  SpO2: 98%  Weight: 213 lb 2 oz (96.7 kg)  Height: 6' (1.829 m)   Wt Readings from Last 3 Encounters:  03/22/20 213 lb 2 oz (96.7 kg)  12/21/19 211 lb 8 oz (95.9 kg)  11/20/19 210 lb 2 oz (95.3 kg)   Lab Results   Component Value Date   CREATININE 0.96 12/21/2019   CREATININE 0.90 11/20/2019   CREATININE 0.96 08/17/2019    Physical Exam Vitals and nursing note reviewed.  Constitutional:      Appearance: He is well-developed.  HENT:     Head: Normocephalic and atraumatic.  Neck:     Vascular: No JVD.  Cardiovascular:     Rate and Rhythm: Normal rate and regular rhythm.  Pulmonary:     Effort: Pulmonary effort is normal. No respiratory distress.     Breath sounds: No wheezing, rhonchi or rales.  Abdominal:     Palpations: Abdomen is soft.     Tenderness: There is no abdominal tenderness.  Musculoskeletal:     Cervical back: Normal range of motion and neck supple.     Right lower leg: No tenderness. No edema.     Left lower leg: No tenderness. No edema.  Skin:    General: Skin is warm and dry.  Neurological:  General: No focal deficit present.     Mental Status: He is alert and oriented to person, place, and time.  Psychiatric:        Mood and Affect: Mood normal.        Behavior: Behavior normal.     Assessment & Plan:  1: Chronic heart failure with reduced ejection fraction- - NYHA class III - euvolemic today - weighing daily; reminded to call for an overnight weight gain of >2 pounds or a weekly weight gain of >5 pounds - weight up 2 pounds from last visit here 3 months ago - not adding; reviewing food labels for sodium content; reviewed the importance of closely following a low sodium diet  - saw cardiology (Paraschos) 02/09/20; returns early May - GDMT of jardiance, entresto, carvedilol and spironolactone; discussed titrating up carvedilol at next visit - BNP 03/02/19 was 990.0 - has received both his COVID vaccines   2: HTN- - BP mildly elevated today - saw PCP ([name]) 06/05/19 - BMP 12/21/19 reviewed and showed sodium 140, potassium 4.2, creatinine 0.96 and GFR >60  3: COPD/ tobacco use-  - hasn't smoked in the last 4 days and he was congratulated on that - building  model cars & motorcycles now to help keep himself busy - complete cessation discussed for 3 minutes with him; used to smoke up to 3 ppd - PFT's completed 11/26/19  4: VT- - ICD placed January 2021 - no recent shocks to report  5: 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   Return in 2 months or sooner for any questions/problems before then.

## 2020-03-22 ENCOUNTER — Encounter: Payer: Self-pay | Admitting: Family

## 2020-03-22 ENCOUNTER — Ambulatory Visit: Payer: Medicaid Other | Attending: Family | Admitting: Family

## 2020-03-22 ENCOUNTER — Other Ambulatory Visit: Payer: Self-pay

## 2020-03-22 VITALS — BP 143/91 | HR 82 | Resp 18 | Ht 72.0 in | Wt 213.1 lb

## 2020-03-22 DIAGNOSIS — R531 Weakness: Secondary | ICD-10-CM | POA: Diagnosis not present

## 2020-03-22 DIAGNOSIS — I5022 Chronic systolic (congestive) heart failure: Secondary | ICD-10-CM | POA: Diagnosis present

## 2020-03-22 DIAGNOSIS — Z7951 Long term (current) use of inhaled steroids: Secondary | ICD-10-CM | POA: Insufficient documentation

## 2020-03-22 DIAGNOSIS — F32A Depression, unspecified: Secondary | ICD-10-CM | POA: Insufficient documentation

## 2020-03-22 DIAGNOSIS — Z79899 Other long term (current) drug therapy: Secondary | ICD-10-CM | POA: Diagnosis not present

## 2020-03-22 DIAGNOSIS — M549 Dorsalgia, unspecified: Secondary | ICD-10-CM | POA: Insufficient documentation

## 2020-03-22 DIAGNOSIS — Z95810 Presence of automatic (implantable) cardiac defibrillator: Secondary | ICD-10-CM | POA: Insufficient documentation

## 2020-03-22 DIAGNOSIS — Z8619 Personal history of other infectious and parasitic diseases: Secondary | ICD-10-CM | POA: Diagnosis not present

## 2020-03-22 DIAGNOSIS — G8929 Other chronic pain: Secondary | ICD-10-CM | POA: Diagnosis not present

## 2020-03-22 DIAGNOSIS — F17210 Nicotine dependence, cigarettes, uncomplicated: Secondary | ICD-10-CM | POA: Insufficient documentation

## 2020-03-22 DIAGNOSIS — I472 Ventricular tachycardia, unspecified: Secondary | ICD-10-CM

## 2020-03-22 DIAGNOSIS — I110 Hypertensive heart disease with heart failure: Secondary | ICD-10-CM | POA: Insufficient documentation

## 2020-03-22 DIAGNOSIS — K7460 Unspecified cirrhosis of liver: Secondary | ICD-10-CM | POA: Diagnosis not present

## 2020-03-22 DIAGNOSIS — Z7982 Long term (current) use of aspirin: Secondary | ICD-10-CM | POA: Insufficient documentation

## 2020-03-22 DIAGNOSIS — I10 Essential (primary) hypertension: Secondary | ICD-10-CM

## 2020-03-22 DIAGNOSIS — J449 Chronic obstructive pulmonary disease, unspecified: Secondary | ICD-10-CM | POA: Insufficient documentation

## 2020-03-22 NOTE — Patient Instructions (Signed)
Continue weighing daily and call for an overnight weight gain of > 2 pounds or a weekly weight gain of >5 pounds. 

## 2020-04-06 ENCOUNTER — Other Ambulatory Visit: Payer: Self-pay | Admitting: Family

## 2020-04-06 MED ORDER — NICOTINE 21 MG/24HR TD PT24
21.0000 mg | MEDICATED_PATCH | Freq: Every day | TRANSDERMAL | 5 refills | Status: DC
Start: 2020-04-06 — End: 2020-06-22

## 2020-04-06 NOTE — Progress Notes (Signed)
Renewed nicotine patch

## 2020-05-06 DIAGNOSIS — A419 Sepsis, unspecified organism: Secondary | ICD-10-CM

## 2020-05-06 HISTORY — PX: IR CHOLANGIOGRAM EXISTING TUBE: IMG6040

## 2020-05-06 HISTORY — DX: Sepsis, unspecified organism: A41.9

## 2020-05-13 ENCOUNTER — Inpatient Hospital Stay
Admission: EM | Admit: 2020-05-13 | Discharge: 2020-05-18 | DRG: 871 | Disposition: A | Discharge status: Home / Self Care | Payer: Medicaid Other | Attending: Internal Medicine | Admitting: Internal Medicine

## 2020-05-13 ENCOUNTER — Inpatient Hospital Stay: Payer: Medicaid Other

## 2020-05-13 ENCOUNTER — Other Ambulatory Visit: Payer: Self-pay

## 2020-05-13 ENCOUNTER — Emergency Department: Payer: Medicaid Other

## 2020-05-13 DIAGNOSIS — G8929 Other chronic pain: Secondary | ICD-10-CM | POA: Diagnosis present

## 2020-05-13 DIAGNOSIS — R571 Hypovolemic shock: Secondary | ICD-10-CM | POA: Diagnosis present

## 2020-05-13 DIAGNOSIS — N179 Acute kidney failure, unspecified: Secondary | ICD-10-CM | POA: Diagnosis not present

## 2020-05-13 DIAGNOSIS — B1920 Unspecified viral hepatitis C without hepatic coma: Secondary | ICD-10-CM | POA: Diagnosis present

## 2020-05-13 DIAGNOSIS — K810 Acute cholecystitis: Secondary | ICD-10-CM

## 2020-05-13 DIAGNOSIS — F17210 Nicotine dependence, cigarettes, uncomplicated: Secondary | ICD-10-CM | POA: Diagnosis present

## 2020-05-13 DIAGNOSIS — Z95810 Presence of automatic (implantable) cardiac defibrillator: Secondary | ICD-10-CM | POA: Diagnosis not present

## 2020-05-13 DIAGNOSIS — A419 Sepsis, unspecified organism: Principal | ICD-10-CM | POA: Diagnosis present

## 2020-05-13 DIAGNOSIS — J9621 Acute and chronic respiratory failure with hypoxia: Secondary | ICD-10-CM | POA: Diagnosis present

## 2020-05-13 DIAGNOSIS — Z7951 Long term (current) use of inhaled steroids: Secondary | ICD-10-CM | POA: Diagnosis not present

## 2020-05-13 DIAGNOSIS — E785 Hyperlipidemia, unspecified: Secondary | ICD-10-CM | POA: Diagnosis present

## 2020-05-13 DIAGNOSIS — R112 Nausea with vomiting, unspecified: Secondary | ICD-10-CM

## 2020-05-13 DIAGNOSIS — I255 Ischemic cardiomyopathy: Secondary | ICD-10-CM | POA: Diagnosis present

## 2020-05-13 DIAGNOSIS — K8000 Calculus of gallbladder with acute cholecystitis without obstruction: Secondary | ICD-10-CM | POA: Diagnosis present

## 2020-05-13 DIAGNOSIS — N170 Acute kidney failure with tubular necrosis: Secondary | ICD-10-CM | POA: Diagnosis present

## 2020-05-13 DIAGNOSIS — I5022 Chronic systolic (congestive) heart failure: Secondary | ICD-10-CM | POA: Diagnosis present

## 2020-05-13 DIAGNOSIS — I252 Old myocardial infarction: Secondary | ICD-10-CM

## 2020-05-13 DIAGNOSIS — K7460 Unspecified cirrhosis of liver: Secondary | ICD-10-CM | POA: Diagnosis present

## 2020-05-13 DIAGNOSIS — J449 Chronic obstructive pulmonary disease, unspecified: Secondary | ICD-10-CM | POA: Diagnosis present

## 2020-05-13 DIAGNOSIS — F32A Depression, unspecified: Secondary | ICD-10-CM | POA: Diagnosis present

## 2020-05-13 DIAGNOSIS — E871 Hypo-osmolality and hyponatremia: Secondary | ICD-10-CM | POA: Diagnosis present

## 2020-05-13 DIAGNOSIS — I2510 Atherosclerotic heart disease of native coronary artery without angina pectoris: Secondary | ICD-10-CM | POA: Diagnosis present

## 2020-05-13 DIAGNOSIS — Z7982 Long term (current) use of aspirin: Secondary | ICD-10-CM | POA: Diagnosis not present

## 2020-05-13 DIAGNOSIS — R9389 Abnormal findings on diagnostic imaging of other specified body structures: Secondary | ICD-10-CM | POA: Diagnosis not present

## 2020-05-13 DIAGNOSIS — Z79899 Other long term (current) drug therapy: Secondary | ICD-10-CM | POA: Diagnosis not present

## 2020-05-13 DIAGNOSIS — I110 Hypertensive heart disease with heart failure: Secondary | ICD-10-CM | POA: Diagnosis present

## 2020-05-13 DIAGNOSIS — R6521 Severe sepsis with septic shock: Secondary | ICD-10-CM | POA: Diagnosis present

## 2020-05-13 DIAGNOSIS — Z20822 Contact with and (suspected) exposure to covid-19: Secondary | ICD-10-CM | POA: Diagnosis present

## 2020-05-13 DIAGNOSIS — E860 Dehydration: Secondary | ICD-10-CM | POA: Diagnosis present

## 2020-05-13 DIAGNOSIS — Z452 Encounter for adjustment and management of vascular access device: Secondary | ICD-10-CM

## 2020-05-13 LAB — COMPREHENSIVE METABOLIC PANEL
ALT: 99 U/L — ABNORMAL HIGH (ref 0–44)
AST: 64 U/L — ABNORMAL HIGH (ref 15–41)
Albumin: 2.7 g/dL — ABNORMAL LOW (ref 3.5–5.0)
Alkaline Phosphatase: 82 U/L (ref 38–126)
Anion gap: 10 (ref 5–15)
BUN: 45 mg/dL — ABNORMAL HIGH (ref 6–20)
CO2: 24 mmol/L (ref 22–32)
Calcium: 7.9 mg/dL — ABNORMAL LOW (ref 8.9–10.3)
Chloride: 96 mmol/L — ABNORMAL LOW (ref 98–111)
Creatinine, Ser: 2.49 mg/dL — ABNORMAL HIGH (ref 0.61–1.24)
GFR, Estimated: 29 mL/min — ABNORMAL LOW (ref >60)
Glucose, Bld: 152 mg/dL — ABNORMAL HIGH (ref 70–99)
Potassium: 3.6 mmol/L (ref 3.5–5.1)
Sodium: 130 mmol/L — ABNORMAL LOW (ref 135–145)
Total Bilirubin: 1.1 mg/dL (ref 0.3–1.2)
Total Protein: 6.1 g/dL — ABNORMAL LOW (ref 6.5–8.1)

## 2020-05-13 LAB — TROPONIN I (HIGH SENSITIVITY)
Troponin I (High Sensitivity): 13 ng/L (ref <18)
Troponin I (High Sensitivity): 10 ng/L (ref <18)

## 2020-05-13 LAB — GLUCOSE, CAPILLARY
Glucose-Capillary: 119 mg/dL — ABNORMAL HIGH (ref 70–99)
Glucose-Capillary: 193 mg/dL — ABNORMAL HIGH (ref 70–99)
Glucose-Capillary: 199 mg/dL — ABNORMAL HIGH (ref 70–99)
Glucose-Capillary: 88 mg/dL (ref 70–99)

## 2020-05-13 LAB — CBC WITH DIFFERENTIAL/PLATELET
Abs Immature Granulocytes: 1.06 10*3/uL — ABNORMAL HIGH (ref 0.00–0.07)
Basophils Absolute: 0.1 10*3/uL (ref 0.0–0.1)
Basophils Relative: 0 %
Eosinophils Absolute: 0 10*3/uL (ref 0.0–0.5)
Eosinophils Relative: 0 %
HCT: 33.2 % — ABNORMAL LOW (ref 39.0–52.0)
Hemoglobin: 11.6 g/dL — ABNORMAL LOW (ref 13.0–17.0)
Immature Granulocytes: 6 %
Lymphocytes Relative: 6 %
Lymphs Abs: 1 10*3/uL (ref 0.7–4.0)
MCH: 30.4 pg (ref 26.0–34.0)
MCHC: 34.9 g/dL (ref 30.0–36.0)
MCV: 86.9 fL (ref 80.0–100.0)
Monocytes Absolute: 1.3 10*3/uL — ABNORMAL HIGH (ref 0.1–1.0)
Monocytes Relative: 7 %
Neutro Abs: 14.5 10*3/uL — ABNORMAL HIGH (ref 1.7–7.7)
Neutrophils Relative %: 81 %
Platelets: 205 10*3/uL (ref 150–400)
RBC: 3.82 MIL/uL — ABNORMAL LOW (ref 4.22–5.81)
RDW: 13.2 % (ref 11.5–15.5)
WBC: 18 10*3/uL — ABNORMAL HIGH (ref 4.0–10.5)
nRBC: 0 % (ref 0.0–0.2)

## 2020-05-13 LAB — URINALYSIS, COMPLETE (UACMP) WITH MICROSCOPIC
Bilirubin Urine: NEGATIVE
Glucose, UA: >=500 mg/dL — AB
Ketones, ur: NEGATIVE mg/dL
Leukocytes,Ua: NEGATIVE
Nitrite: NEGATIVE
Protein, ur: 30 mg/dL — AB
Specific Gravity, Urine: 1.008 (ref 1.005–1.030)
Squamous Epithelial / HPF: NONE SEEN (ref 0–5)
pH: 5 (ref 5.0–8.0)

## 2020-05-13 LAB — LACTIC ACID, PLASMA: Lactic Acid, Venous: 1.6 mmol/L (ref 0.5–1.9)

## 2020-05-13 LAB — PROTIME-INR
INR: 1.2 (ref 0.8–1.2)
Prothrombin Time: 14.7 seconds (ref 11.4–15.2)

## 2020-05-13 LAB — PROCALCITONIN: Procalcitonin: 4.63 ng/mL

## 2020-05-13 LAB — MRSA PCR SCREENING: MRSA by PCR: NEGATIVE

## 2020-05-13 LAB — LIPASE, BLOOD: Lipase: 18 U/L (ref 11–51)

## 2020-05-13 LAB — HIV ANTIBODY (ROUTINE TESTING W REFLEX): HIV Screen 4th Generation wRfx: NONREACTIVE

## 2020-05-13 LAB — RESP PANEL BY RT-PCR (FLU A&B, COVID) ARPGX2
Influenza A by PCR: NEGATIVE
Influenza B by PCR: NEGATIVE
SARS Coronavirus 2 by RT PCR: NEGATIVE

## 2020-05-13 LAB — MAGNESIUM: Magnesium: 2.2 mg/dL (ref 1.7–2.4)

## 2020-05-13 LAB — BRAIN NATRIURETIC PEPTIDE: B Natriuretic Peptide: 219 pg/mL — ABNORMAL HIGH (ref 0.0–100.0)

## 2020-05-13 LAB — AMMONIA: Ammonia: 22 umol/L (ref 9–35)

## 2020-05-13 LAB — PHOSPHORUS: Phosphorus: 4.2 mg/dL (ref 2.5–4.6)

## 2020-05-13 MED ORDER — PIPERACILLIN-TAZOBACTAM 3.375 G IVPB: 3.3750 g | Freq: Three times a day (TID) | INTRAVENOUS | Status: DC

## 2020-05-13 MED ORDER — PIPERACILLIN-TAZOBACTAM 3.375 G IVPB 30 MIN
3.3750 g | Freq: Once | INTRAVENOUS | Status: AC
  Administered 2020-05-13: 3.375 g via INTRAVENOUS
  Filled 2020-05-13: qty 50

## 2020-05-13 MED ORDER — MORPHINE SULFATE (PF) 2 MG/ML IV SOLN
2.0000 mg | INTRAVENOUS | Status: DC | PRN
  Administered 2020-05-13 – 2020-05-17 (×15): 2 mg via INTRAVENOUS
  Filled 2020-05-13 (×15): qty 1

## 2020-05-13 MED ORDER — ONDANSETRON HCL 4 MG/2ML IJ SOLN
4.0000 mg | Freq: Four times a day (QID) | INTRAMUSCULAR | Status: DC | PRN
  Administered 2020-05-14: 4 mg via INTRAVENOUS
  Filled 2020-05-13: qty 2

## 2020-05-13 MED ORDER — VANCOMYCIN HCL IN DEXTROSE 1-5 GM/200ML-% IV SOLN
1000.0000 mg | Freq: Once | INTRAVENOUS | Status: AC
  Administered 2020-05-13: 1000 mg via INTRAVENOUS
  Filled 2020-05-13: qty 200

## 2020-05-13 MED ORDER — NOREPINEPHRINE 16 MG/250ML-% IV SOLN
0.0000 ug/min | INTRAVENOUS | Status: DC
  Administered 2020-05-13: 15 ug/min via INTRAVENOUS
  Administered 2020-05-14: 6 ug/min via INTRAVENOUS
  Administered 2020-05-16: 5 ug/min via INTRAVENOUS
  Filled 2020-05-13 (×3): qty 250

## 2020-05-13 MED ORDER — OXYCODONE HCL 5 MG PO TABS
15.0000 mg | ORAL_TABLET | ORAL | Status: DC
  Administered 2020-05-13 – 2020-05-18 (×26): 15 mg via ORAL
  Filled 2020-05-13 (×26): qty 3

## 2020-05-13 MED ORDER — LACTATED RINGERS IV SOLN: INTRAVENOUS | Status: AC

## 2020-05-13 MED ORDER — DOCUSATE SODIUM 100 MG PO CAPS: 100.0000 mg | ORAL_CAPSULE | Freq: Two times a day (BID) | ORAL | Status: DC | PRN

## 2020-05-13 MED ORDER — NOREPINEPHRINE 4 MG/250ML-% IV SOLN
2.0000 ug/min | INTRAVENOUS | Status: DC
  Administered 2020-05-13: 5 ug/min via INTRAVENOUS
  Administered 2020-05-13: 15 ug/min via INTRAVENOUS
  Filled 2020-05-13: qty 250

## 2020-05-13 MED ORDER — NICOTINE 21 MG/24HR TD PT24
21.0000 mg | MEDICATED_PATCH | Freq: Every day | TRANSDERMAL | Status: DC
  Administered 2020-05-13 – 2020-05-18 (×6): 21 mg via TRANSDERMAL
  Filled 2020-05-13 (×7): qty 1

## 2020-05-13 MED ORDER — SODIUM CHLORIDE 0.9 % IV SOLN
1.0000 g | Freq: Once | INTRAVENOUS | Status: DC
  Filled 2020-05-13: qty 1

## 2020-05-13 MED ORDER — VANCOMYCIN HCL IN DEXTROSE 1-5 GM/200ML-% IV SOLN
1000.0000 mg | Freq: Once | INTRAVENOUS | Status: DC
  Filled 2020-05-13: qty 200

## 2020-05-13 MED ORDER — POLYETHYLENE GLYCOL 3350 17 G PO PACK: 17.0000 g | PACK | Freq: Every day | ORAL | Status: DC | PRN

## 2020-05-13 MED ORDER — ONDANSETRON HCL 4 MG/2ML IJ SOLN
4.0000 mg | Freq: Once | INTRAMUSCULAR | Status: AC
  Administered 2020-05-13: 4 mg via INTRAVENOUS
  Filled 2020-05-13: qty 2

## 2020-05-13 MED ORDER — INSULIN ASPART 100 UNIT/ML ~~LOC~~ SOLN
0.0000 [IU] | Freq: Four times a day (QID) | SUBCUTANEOUS | Status: DC
  Filled 2020-05-13: qty 1

## 2020-05-13 MED ORDER — SODIUM CHLORIDE 0.9 % IV SOLN
2.0000 g | Freq: Once | INTRAVENOUS | Status: DC
  Filled 2020-05-13: qty 2

## 2020-05-13 MED ORDER — ALBUTEROL SULFATE HFA 108 (90 BASE) MCG/ACT IN AERS
2.0000 | INHALATION_SPRAY | Freq: Four times a day (QID) | RESPIRATORY_TRACT | Status: DC | PRN
  Filled 2020-05-13: qty 6.7

## 2020-05-13 MED ORDER — LACTATED RINGERS IV BOLUS
500.0000 mL | Freq: Once | INTRAVENOUS | Status: AC
  Administered 2020-05-13: 500 mL via INTRAVENOUS

## 2020-05-13 MED ORDER — ACETAMINOPHEN 325 MG PO TABS: 650.0000 mg | ORAL_TABLET | ORAL | Status: DC | PRN

## 2020-05-13 MED ORDER — POTASSIUM CHLORIDE 10 MEQ/100ML IV SOLN
10.0000 meq | INTRAVENOUS | Status: AC
  Administered 2020-05-13 (×2): 10 meq via INTRAVENOUS
  Filled 2020-05-13 (×2): qty 100

## 2020-05-13 MED ORDER — MIDAZOLAM HCL 2 MG/2ML IJ SOLN
INTRAMUSCULAR | Status: AC | PRN
  Administered 2020-05-13 (×2): 1 mg via INTRAVENOUS

## 2020-05-13 MED ORDER — SODIUM CHLORIDE 0.9% FLUSH
5.0000 mL | Freq: Three times a day (TID) | INTRAVENOUS | Status: DC
  Administered 2020-05-13 – 2020-05-18 (×16): 5 mL

## 2020-05-13 MED ORDER — MIDAZOLAM HCL 2 MG/2ML IJ SOLN
INTRAMUSCULAR | Status: AC
  Filled 2020-05-13: qty 2

## 2020-05-13 MED ORDER — MORPHINE SULFATE (PF) 2 MG/ML IV SOLN
1.0000 mg | INTRAVENOUS | Status: AC
Start: 2020-05-13 — End: 2020-05-13
  Administered 2020-05-13: 1 mg via INTRAVENOUS
  Filled 2020-05-13: qty 1

## 2020-05-13 MED ORDER — HEPARIN SODIUM (PORCINE) 5000 UNIT/ML IJ SOLN
5000.0000 [IU] | Freq: Three times a day (TID) | INTRAMUSCULAR | Status: DC
  Administered 2020-05-13 – 2020-05-18 (×17): 5000 [IU] via SUBCUTANEOUS
  Filled 2020-05-13 (×17): qty 1

## 2020-05-13 MED ORDER — NOREPINEPHRINE 4 MG/250ML-% IV SOLN
4.0000 ug/min | INTRAVENOUS | Status: DC
  Administered 2020-05-13: 2 ug/min via INTRAVENOUS
  Filled 2020-05-13: qty 250

## 2020-05-13 MED ORDER — LACTATED RINGERS IV BOLUS
250.0000 mL | Freq: Once | INTRAVENOUS | Status: AC
  Administered 2020-05-13: 250 mL via INTRAVENOUS

## 2020-05-13 MED ORDER — FENTANYL CITRATE (PF) 100 MCG/2ML IJ SOLN
INTRAMUSCULAR | Status: AC | PRN
  Administered 2020-05-13 (×2): 50 ug via INTRAVENOUS

## 2020-05-13 MED ORDER — SODIUM CHLORIDE 0.9 % IV SOLN: 250.0000 mL | INTRAVENOUS | Status: DC

## 2020-05-13 MED ORDER — HYDROCORTISONE NA SUCCINATE PF 100 MG IJ SOLR
50.0000 mg | Freq: Four times a day (QID) | INTRAMUSCULAR | Status: DC
  Administered 2020-05-13 – 2020-05-17 (×16): 50 mg via INTRAVENOUS
  Filled 2020-05-13 (×16): qty 2

## 2020-05-13 MED ORDER — OXYCODONE HCL 5 MG PO TABS: 15.0000 mg | ORAL_TABLET | ORAL | Status: DC | PRN

## 2020-05-13 MED ORDER — PIPERACILLIN-TAZOBACTAM 3.375 G IVPB
3.3750 g | Freq: Three times a day (TID) | INTRAVENOUS | Status: DC
  Administered 2020-05-13 – 2020-05-18 (×15): 3.375 g via INTRAVENOUS
  Filled 2020-05-13 (×13): qty 50

## 2020-05-13 MED ORDER — CHLORHEXIDINE GLUCONATE CLOTH 2 % EX PADS
6.0000 | MEDICATED_PAD | Freq: Every day | CUTANEOUS | Status: DC
  Administered 2020-05-13 – 2020-05-14 (×3): 6 via TOPICAL

## 2020-05-13 MED ORDER — FENTANYL CITRATE (PF) 100 MCG/2ML IJ SOLN
INTRAMUSCULAR | Status: AC
  Filled 2020-05-13: qty 2

## 2020-05-13 MED ORDER — MOMETASONE FURO-FORMOTEROL FUM 200-5 MCG/ACT IN AERO
2.0000 | INHALATION_SPRAY | Freq: Two times a day (BID) | RESPIRATORY_TRACT | Status: DC
  Administered 2020-05-13 – 2020-05-18 (×11): 2 via RESPIRATORY_TRACT
  Filled 2020-05-13: qty 8.8

## 2020-05-13 MED ORDER — VANCOMYCIN HCL 1250 MG/250ML IV SOLN
1250.0000 mg | Freq: Once | INTRAVENOUS | Status: AC
  Administered 2020-05-13: 1250 mg via INTRAVENOUS
  Filled 2020-05-13: qty 250

## 2020-05-13 MED ORDER — FAMOTIDINE IN NACL 20-0.9 MG/50ML-% IV SOLN
20.0000 mg | INTRAVENOUS | Status: DC
  Administered 2020-05-13 – 2020-05-17 (×5): 20 mg via INTRAVENOUS
  Filled 2020-05-13 (×5): qty 50

## 2020-05-13 NOTE — H&P (Addendum)
NAME:  Brent [name], MRN:  [id number], DOB:  1960/11/28, LOS: 0 ADMISSION DATE:  05/13/2020, CONSULTATION DATE: 05/13/2020 REFERRING MD: Dr. Don Perking, CHIEF COMPLAINT: Emesis   History of Present Illness:  This is a 60 yo male who presented to [facility] ER on 04/8 via EMS with c/o shortness of breath, dizziness, and emesis onset 3 days prior to ER presentation. He also endorses right sided and epigastric abdominal pain. He does not wear chronic home O2, but he did check his oxygen levels at home which were in the 70's.  EMS reported when they arrived at pts residence he was diaphoretic and pale with initial sbp in the 70's. He was also hypoxic with O2 sats 88% on RA, but without s/sx of respiratory distress.  EMS placed pt on 2L O2 via nasal canula. Pt reports he did eat out at "[employer]" on 04/4.  He reports he's vomited nonbloody nonbilious emesis on average once a day starting 3 days ago.  His most recent BM was 04/7. He denies sick contacts.    ED course Upon arrival to the ER pt hypotensive with sbp in the 60's.  Lab results revealed BNP 219, pct 4.63, Na+ 130, chloride 96, glucose 152, BUN 45, creatinine 2.49, calcium 7.9, albumin 2.7, AST 64, ALT 99, wbc 18.0, and hgb 11.6.  Respiratory Panel by RT-PCR negative.  CXR concerning for bronchitis changes.  Sepsis protocol initiated: pt received 750 ml fluid bolus, vancomycin, and zosyn.  However, he remained hypotensive requiring levophed gtt.  PCCM team contacted for ICU admission.  Pertinent  Medical History  Subarachnoid Hemorrhage Spinal Stenosis HTN HLD Hepatitis C Depression COPD Chronic Pain  Chronic Systolic CHF~EF 25 to 30% via Echo 03/01/2019 Ventricular Tachycardia requiring ICD   Significant Hospital Events: Including procedures, antibiotic start and stop dates in addition to other pertinent events   . Pt admitted to ICU with hypovolemic and septic shock requiring levophed gtt . Vancomycin 04/8>> . Zosyn 04/8>>  Interim  History / Subjective:  Pt c/o right sided and epigastric pain   Objective   Blood pressure (!) 70/48, pulse (!) 43, temperature 98.2 F (36.8 C), temperature source Oral, resp. rate 17, height 6' (1.829 m), weight 90.7 kg, SpO2 92 %.        Intake/Output Summary (Last 24 hours) at 05/13/2020 0331 Last data filed at 05/13/2020 5621 Gross per 24 hour  Intake 500 ml  Output --  Net 500 ml   Filed Weights   05/13/20 0053  Weight: 90.7 kg    Examination: General: acutely ill appearing male, resting in bed NAD  HENT: supple, no JVD Lungs: clear throughout, even, non labored  Cardiovascular: sinus rhythm, rrr, no R/G, 2+ radial/2+ distal pulses, trace bilateral lower extremity edema  Abdomen: +BS x4, obese, soft, right sided and epigastric tenderness  Extremities: normal bulk and tone, moves all extremities  Neuro: alert and oriented, follows commands, PERRLA  GU: no foley   Labs/imaging that I havepersonally reviewed  (right click and "Reselect all SmartList Selections" daily)  04/8: BNP 219, pct 4.63, Na+ 130, chloride 96, glucose 152, BUN 45, creatinine 2.49, calcium 7.9, albumin 2.7, AST 64, ALT 99, wbc 18.0, and hgb 11.6 04/8: Respiratory Panel by RT-PCR negative 04/8: CXR concerning for bronchitis changes  Resolved Hospital Problem list   N/A  Assessment & Plan:   Acute on chronic respiratory failure secondary to COPD Hx: Current everyday smoker  Supplemental O2 for dyspnea and/or hypoxia Continue scheduled and prn bronchodilator  therapy  Smoking cessation counseling provided   Hypovolemic and septic shock  Hx: HTN, HLD, Ventricular Tachycardia, ICD, Ischemic Cardiomyopathy, and Chronic Systolic CHF~EF 25 to 30%  Continuous telemetry monitoring  Gentle fluid resuscitation and prn levophed gtt to maintain map >65 Hold outpatient antihypertensives for now  Once able to tolerate po's resume outpatient aspirin and atorvastatin   Acute renal failure secondary to ATN   Hyponatremia  Trend BMP Replace electrolytes as indicated Monitor UOP Avoid nephrotoxic medications  Gentle fluid resuscitation   Hepatitis C and cirrhosis  Trend hepatic function panel   Sepsis of unclear etiology  Trend WBC and monitor fever curve  Trend PCT  Follow cultures  Continue zosyn for now   Nausea/Vomiting  Abdominal pain  CT Abd Pelvis pending  Prn zofran for nausea/vomiting   Best practice (right click and "Reselect all SmartList Selections" daily)  Diet:  NPO Pain/Anxiety/Delirium protocol (if indicated): No VAP protocol (if indicated): Not indicated DVT prophylaxis: Subcutaneous Heparin GI prophylaxis: H2B Glucose control:  SSI No Central venous access:  N/A Arterial line:  N/A Foley:  N/A Mobility:  bed rest  PT consulted: N/A Last date of multidisciplinary goals of care discussion [N/A] Code Status:  full code Disposition: ICU   Labs   CBC: Recent Labs  Lab 05/13/20 0055  WBC 18.0*  NEUTROABS 14.5*  HGB 11.6*  HCT 33.2*  MCV 86.9  PLT 205    Basic Metabolic Panel: Recent Labs  Lab 05/13/20 0055  NA 130*  K 3.6  CL 96*  CO2 24  GLUCOSE 152*  BUN 45*  CREATININE 2.49*  CALCIUM 7.9*  MG 2.2   GFR: Estimated Creatinine Clearance: 35.5 mL/min (A) (by C-G formula based on SCr of 2.49 mg/dL (H)). Recent Labs  Lab 05/13/20 0054 05/13/20 0055  PROCALCITON 4.63  --   WBC  --  18.0*    Liver Function Tests: Recent Labs  Lab 05/13/20 0055  AST 64*  ALT 99*  ALKPHOS 82  BILITOT 1.1  PROT 6.1*  ALBUMIN 2.7*   No results for input(s): LIPASE, AMYLASE in the last 168 hours. Recent Labs  Lab 05/13/20 0054  AMMONIA 22    ABG    Component Value Date/Time   PHART 7.34 (L) 02/28/2019 1219   PCO2ART 47 02/28/2019 1219   PO2ART <31.0 (LL) 02/28/2019 1219   HCO3 25.4 02/28/2019 1219   ACIDBASEDEF 0.8 02/28/2019 1219   O2SAT 52.7 02/28/2019 1219     Coagulation Profile: No results for input(s): INR, PROTIME in the last  168 hours.  Cardiac Enzymes: No results for input(s): CKTOTAL, CKMB, CKMBINDEX, TROPONINI in the last 168 hours.  HbA1C: Hgb A1c MFr Bld  Date/Time Value Ref Range Status  02/28/2019 04:16 PM 5.9 (H) 4.8 - 5.6 % Final    Comment:    (NOTE) Pre diabetes:          5.7%-6.4% Diabetes:              >6.4% Glycemic control for   <7.0% adults with diabetes     CBG: No results for input(s): GLUCAP in the last 168 hours.  Review of Systems: Positives in BOLD   Gen: Denies fever, chills, weight change, fatigue, night sweats HEENT: Denies blurred vision, double vision, hearing loss, tinnitus, sinus congestion, rhinorrhea, sore throat, neck stiffness, dysphagia PULM: shortness of breath, cough, sputum production, hemoptysis, wheezing CV: Denies chest pain, edema, orthopnea, paroxysmal nocturnal dyspnea, palpitations GI: abdominal pain, nausea, vomiting, diarrhea, hematochezia, melena, constipation,  change in bowel habits GU: Denies dysuria, hematuria, polyuria, oliguria, urethral discharge Endocrine: Denies hot or cold intolerance, polyuria, polyphagia or appetite change Derm: Denies rash, dry skin, scaling or peeling skin change Heme: Denies easy bruising, bleeding, bleeding gums Neuro: dizziness, headache, numbness, weakness, slurred speech, loss of memory or consciousness  Past Medical History:  He,  has a past medical history of CHF (congestive heart failure) (HCC), Chronic pain, COPD (chronic obstructive pulmonary disease) (HCC), Depression, Hepatitis C, Hyperlipidemia, Hypertension, Spinal stenosis, and Subarachnoid hemorrhage (HCC).   Surgical History:   Past Surgical History:  Procedure Laterality Date  . LEFT HEART CATH AND CORONARY ANGIOGRAPHY N/A 03/04/2019   Procedure: LEFT HEART CATH AND CORONARY ANGIOGRAPHY;  Surgeon: Lamar Blinks, MD;  Location: [facility] INVASIVE CV LAB;  Service: Cardiovascular;  Laterality: N/A;     Social History:   reports that he has been smoking  cigarettes. He has been smoking about 2.00 packs per day. He has never used smokeless tobacco. He reports previous alcohol use. He reports previous drug use.   Family History:  His family history is not on file.   Allergies No Known Allergies   Home Medications  Prior to Admission medications   Medication Sig Start Date End Date Taking? Authorizing Provider  albuterol (VENTOLIN HFA) 108 (90 Base) MCG/ACT inhaler Inhale 2 puffs into the lungs every 6 (six) hours as needed. 06/05/19 06/04/20 Yes [provider]  amiodarone (PACERONE) 400 MG tablet Take 1 tablet (400 mg total) by mouth daily. 08/17/19  Yes Clarisa Kindred A, FNP  aspirin EC 81 MG EC tablet Take 1 tablet (81 mg total) by mouth daily. 03/06/19  Yes Danford, Earl Lites, MD  atorvastatin (LIPITOR) 80 MG tablet Take 1 tablet (80 mg total) by mouth daily at 6 PM. 08/17/19  Yes Hackney, Jarold Song, FNP  budesonide-formoterol (SYMBICORT) 160-4.5 MCG/ACT inhaler Inhale 2 puffs into the lungs 2 (two) times daily. 06/05/19 06/04/20 Yes [provider]  buPROPion (WELLBUTRIN XL) 150 MG 24 hr tablet Take 150 mg by mouth daily.   Yes [provider]  carvedilol (COREG) 3.125 MG tablet Take 1 tablet (3.125 mg total) by mouth 2 (two) times daily with a meal. 12/21/19  Yes Hackney, Tina A, FNP  cyclobenzaprine (FLEXERIL) 10 MG tablet Take 5-10 mg by mouth 2 (two) times daily.   Yes [provider]  diclofenac Sodium (VOLTAREN) 1 % GEL Apply 2 g topically 4 (four) times daily as needed. 04/06/20  Yes [provider]  empagliflozin (JARDIANCE) 10 MG TABS tablet Take 1 tablet (10 mg total) by mouth daily before breakfast. 12/21/19  Yes Clarisa Kindred A, FNP  gabapentin (NEURONTIN) 300 MG capsule Take 300 mg by mouth 2 (two) times daily.   Yes [provider]  isosorbide mononitrate (IMDUR) 30 MG 24 hr tablet Take 30 mg by mouth daily.   Yes [provider]  naproxen sodium (ALEVE) 220 MG tablet Take  220 mg by mouth daily as needed.   Yes [provider]  nicotine (NICODERM CQ - DOSED IN MG/24 HOURS) 21 mg/24hr patch Place 1 patch (21 mg total) onto the skin daily. 04/06/20  Yes Hackney, Inetta Fermo A, FNP  oxyCODONE (ROXICODONE) 15 MG immediate release tablet Take 1 tablet (15 mg total) by mouth every 4 (four) hours as needed for moderate pain. 03/05/19  Yes Danford, Earl Lites, MD  sacubitril-valsartan (ENTRESTO) 97-103 MG Take 1 tablet by mouth 2 (two) times daily. 12/21/19  Yes Delma Freeze,  FNP  spironolactone (ALDACTONE) 25 MG tablet Take 1 tablet (25 mg total) by mouth daily. 08/17/19  Yes Hackney, Inetta Fermo A, FNP  nitroGLYCERIN (NITROSTAT) 0.4 MG SL tablet Place 1 tablet (0.4 mg total) under the tongue every 5 (five) minutes x 3 doses as needed for chest pain. 03/05/19   Danford, Earl Lites, MD     Sonda Rumble, AGNP  Pulmonary/Critical Care Pager [phone] (please enter 7 digits) PCCM Consult Pager [phone] (please enter 7 digits)

## 2020-05-13 NOTE — Procedures (Signed)
Interventional Radiology Procedure Note  Procedure: Percutaneous cholecystostomy tube placement  Complications: None  Estimated Blood Loss: < 10 mL  Findings: 10 Fr drain placed in GB lumen. Attached to gravity bag. Sample of bile sent for culture.  Jodi Marble. Fredia Sorrow, M.D Pager:  [phone]

## 2020-05-13 NOTE — Progress Notes (Signed)
Due to abnormal CT Abd Pelvis results discussed case with on call surgeon Dr. Tonna Boehringer and need for consultation.  Sonda Rumble, AGNP  Pulmonary/Critical Care Pager [phone] (please enter 7 digits) PCCM Consult Pager [phone] (please enter 7 digits)

## 2020-05-13 NOTE — Procedures (Signed)
Central Venous Catheter Insertion Procedure Note  Brent [name]  [id number]  [birth year]  Date:05/13/20  Time:10:06 AM   Provider Performing:Joei Frangos A Tabbetha Kutscher   Procedure: Insertion of Non-tunneled Central Venous [phone]) with US guidance ([id number])   Indication(s) Medication administration and Difficult access  Consent Risks of the procedure as well as the alternatives and risks of each were explained to the patient and/or caregiver.  Consent for the procedure was obtained and is signed in the bedside chart  Anesthesia Topical only with 1% lidocaine   Timeout Verified patient identification, verified procedure, site/side was marked, verified correct patient position, special equipment/implants available, medications/allergies/relevant history reviewed, required imaging and test results available.  Sterile Technique Maximal sterile technique including full sterile barrier drape, hand hygiene, sterile gown, sterile gloves, mask, hair covering, sterile ultrasound probe cover (if used).  Procedure Description Area of catheter insertion was cleaned with chlorhexidine and draped in sterile fashion.  With real-time ultrasound guidance a central venous catheter was placed into the right internal jugular vein. Nonpulsatile blood flow and easy flushing noted in all ports.  The catheter was sutured in place and sterile dressing applied.  Complications/Tolerance None; patient tolerated the procedure well. Chest X-ray is ordered to verify placement for internal jugular or subclavian cannulation.   Chest x-ray is not ordered for femoral cannulation.  EBL Minimal  Specimen(s) None   Line secure at 17 cm Alec Biopatch applied to the insertion site   Webb Silversmith, DNP, CCRN, FNP-C, AGACNP-BC Acute Care Nurse Practitioner  [facility] Pager: [phone] [place] [facility]

## 2020-05-13 NOTE — Consult Note (Signed)
PHARMACY CONSULT NOTE - FOLLOW UP  Pharmacy Consult for Electrolyte Monitoring and Replacement   Recent Labs: Potassium (mmol/L)  Date Value  05/13/2020 3.6   Magnesium (mg/dL)  Date Value  02/13/3233 2.2   Calcium (mg/dL)  Date Value  57/32/2025 7.9 (L)   Albumin (g/dL)  Date Value  42/70/6237 2.7 (L)   Phosphorus (mg/dL)  Date Value  62/83/1517 4.2   Sodium (mmol/L)  Date Value  05/13/2020 130 (L)   Corrected Ca: 8.9  Assessment: 60 yo M presented with shortness of breath, dizziness, and emesis x 3 days. PMH includes CHF with EF of 25% s/p defibrillator, cirrhosis, COPD, HTN, and HLD. Pharmacy consulted to monitor and replace electrolytes.  MIVF: LR @ 150 mL/hr, 250 mL and 500 mL bolus given 4/8 AM  Goal of Therapy:  K 4.0-5.1 Mg 2.0-2.4 Electrolytes WNL  Plan:  --K 3.6 - ordered KCl 10 mEq x 2 doses --Na 130 - continue to monitor --All other electrolytes WNL --Monitor electrolytes with AM labs  Reatha Armour, PharmD Pharmacy Resident  05/13/2020 7:54 AM

## 2020-05-13 NOTE — Progress Notes (Signed)
Pharmacy Antibiotic Note  Brent [name] is a 60 y.o. male admitted on 05/13/2020 with sepsis. Pt presented with DOB, dizziness, and emesis x 3 days. Pt endorses R sided and epigastric abdominal pain. CT concerning for acute calculus cholecystitis. IR recommending cholecystostomy tube. Pt is not a candidate for surgery. PMH includes CHF of 25% s/p defibrillator, cirrhosis, COPD, HTN, and HLD. Pharmacy has been consulted for Zosyn dosing.  Plan: Continue Zosyn 3.375g IV q8h (4 hour infusion) Monitor Scr with AM labs  Height: 6' (182.9 cm) Weight: 90.7 kg (200 lb) IBW/kg (Calculated) : 77.6  Temp (24hrs), Avg:98.4 F (36.9 C), Min:98.2 F (36.8 C), Max:98.5 F (36.9 C)  Recent Labs  Lab 05/13/20 0055 05/13/20 0333  WBC 18.0*  --   CREATININE 2.49*  --   LATICACIDVEN  --  1.6    Estimated Creatinine Clearance: 35.5 mL/min (A) (by C-G formula based on SCr of 2.49 mg/dL (H)).    No Known Allergies  Antimicrobials this admission: 04/08 Cefepime >> x 0 (DCd, changed to Zosyn) 04/08 Vancomycin >> x 1 04/08 Zosyn >>  Microbiology results: 04/8 BCx: NGTD  Thank you for allowing pharmacy to be a part of this patient's care.  Reatha Armour, PharmD Pharmacy Resident  05/13/2020 1:52 PM

## 2020-05-13 NOTE — ED Provider Notes (Signed)
[facility] Emergency Department Provider Note  ____________________________________________  Time seen: Approximately 1:53 AM  I have reviewed the triage vital signs and the nursing notes.   HISTORY  Chief Complaint Shortness of Breath and Emesis   HPI Ranelle OysterMark Dragon is a 60 y.o. male with a history of CHF the EF of 25% status post defibrillator, cirrhosis of the liver, COPD, hypertension, hyperlipidemia  who presents for evaluation of dizziness.  Patient reports that over the last 3 days he has had several daily episodes of nonbloody nonbilious emesis.  This evening he felt extremely dizzy like he was going to pass out and felt short of breath.  He reports that over the last 24 hours he only vomited one time.  He continues to endorse mild shortness of breath.  Denies any chest pain.  When EMS arrived patient was diaphoretic and pale with BP in the 70s.  Patient denies chest pain, abdominal pain, diarrhea, fever, cough.  No known sick contact exposures.  Past Medical History:  Diagnosis Date  . CHF (congestive heart failure) (HCC)   . Chronic pain   . COPD (chronic obstructive pulmonary disease) (HCC)   . Depression   . Hepatitis C   . Hyperlipidemia   . Hypertension   . Spinal stenosis   . Subarachnoid hemorrhage [facility](HCC)     Patient Active Problem List   Diagnosis Date Noted  . Septic shock (HCC) 05/13/2020  . History of sustained ventricular tachycardia 03/03/2019  . Chest pain 03/03/2019  . Chronic systolic CHF (congestive heart failure) (HCC) 03/03/2019  . CAD (coronary artery disease) 03/03/2019  . CHF (congestive heart failure) (HCC) 03/03/2019  . Acute systolic CHF (congestive heart failure) (HCC)   . COPD with acute exacerbation (HCC)   . Other cirrhosis of liver (HCC)   . Chronic hepatitis C without hepatic coma (HCC)   . Hep B w/o coma   . Acute respiratory failure with hypoxia (HCC)   . Acute congestive heart failure (HCC)   .  Hyperlipidemia   . Ventricular tachycardia (HCC) 02/28/2019  . Tobacco abuse counseling 02/28/2019  . SOB (shortness of breath) 02/28/2019  . NSTEMI (non-ST elevated myocardial infarction) (HCC) 02/28/2019  . Transaminitis 02/28/2019    Past Surgical History:  Procedure Laterality Date  . LEFT HEART CATH AND CORONARY ANGIOGRAPHY N/A 03/04/2019   Procedure: LEFT HEART CATH AND CORONARY ANGIOGRAPHY;  Surgeon: Lamar BlinksKowalski, Bruce J, MD;  Location: [facility] INVASIVE CV LAB;  Service: Cardiovascular;  Laterality: N/A;    Prior to Admission medications   Medication Sig Start Date End Date Taking? Authorizing Provider  albuterol (VENTOLIN HFA) 108 (90 Base) MCG/ACT inhaler Inhale 2 puffs into the lungs every 6 (six) hours as needed. 06/05/19 06/04/20 Yes [provider]  amiodarone (PACERONE) 400 MG tablet Take 1 tablet (400 mg total) by mouth daily. 08/17/19  Yes Clarisa KindredHackney, Tina A, FNP  aspirin EC 81 MG EC tablet Take 1 tablet (81 mg total) by mouth daily. 03/06/19  Yes Danford, Earl Liteshristopher P, MD  atorvastatin (LIPITOR) 80 MG tablet Take 1 tablet (80 mg total) by mouth daily at 6 PM. 08/17/19  Yes Hackney, Jarold Songina A, FNP  budesonide-formoterol (SYMBICORT) 160-4.5 MCG/ACT inhaler Inhale 2 puffs into the lungs 2 (two) times daily. 06/05/19 06/04/20 Yes [provider]  buPROPion (WELLBUTRIN XL) 150 MG 24 hr tablet Take 150 mg by mouth daily.   Yes [provider]  carvedilol (COREG) 3.125 MG tablet Take 1 tablet (3.125 mg total) by  mouth 2 (two) times daily with a meal. 12/21/19  Yes Hackney, Tina A, FNP  cyclobenzaprine (FLEXERIL) 10 MG tablet Take 5-10 mg by mouth 2 (two) times daily.   Yes [provider]  diclofenac Sodium (VOLTAREN) 1 % GEL Apply 2 g topically 4 (four) times daily as needed. 04/06/20  Yes [provider]  empagliflozin (JARDIANCE) 10 MG TABS tablet Take 1 tablet (10 mg total) by mouth daily before breakfast. 12/21/19  Yes Clarisa Kindred A, FNP  gabapentin  (NEURONTIN) 300 MG capsule Take 300 mg by mouth 2 (two) times daily.   Yes [provider]  isosorbide mononitrate (IMDUR) 30 MG 24 hr tablet Take 30 mg by mouth daily.   Yes [provider]  naproxen sodium (ALEVE) 220 MG tablet Take 220 mg by mouth daily as needed.   Yes [provider]  nicotine (NICODERM CQ - DOSED IN MG/24 HOURS) 21 mg/24hr patch Place 1 patch (21 mg total) onto the skin daily. 04/06/20  Yes Hackney, Inetta Fermo A, FNP  oxyCODONE (ROXICODONE) 15 MG immediate release tablet Take 1 tablet (15 mg total) by mouth every 4 (four) hours as needed for moderate pain. 03/05/19  Yes Danford, Earl Lites, MD  sacubitril-valsartan (ENTRESTO) 97-103 MG Take 1 tablet by mouth 2 (two) times daily. 12/21/19  Yes Clarisa Kindred A, FNP  spironolactone (ALDACTONE) 25 MG tablet Take 1 tablet (25 mg total) by mouth daily. 08/17/19  Yes Hackney, Inetta Fermo A, FNP  nitroGLYCERIN (NITROSTAT) 0.4 MG SL tablet Place 1 tablet (0.4 mg total) under the tongue every 5 (five) minutes x 3 doses as needed for chest pain. 03/05/19   Danford, Earl Lites, MD    Allergies Patient has no known allergies.  History reviewed. No pertinent family history.  Social History Social History   Tobacco Use  . Smoking status: Current Every Day Smoker    Packs/day: 2.00    Types: Cigarettes  . Smokeless tobacco: Never Used  Substance Use Topics  . Alcohol use: Not Currently  . Drug use: Not Currently    Review of Systems  Constitutional: Negative for fever. + Lightheadedness Eyes: Negative for visual changes. ENT: Negative for sore throat. Neck: No neck pain  Cardiovascular: Negative for chest pain. Respiratory: + shortness of breath. Gastrointestinal: Negative for abdominal pain or diarrhea. + N/V Genitourinary: Negative for dysuria. Musculoskeletal: Negative for back pain. Skin: Negative for rash. Neurological: Negative for headaches, weakness or numbness. Psych: No SI or  HI  ____________________________________________   PHYSICAL EXAM:  VITAL SIGNS: ED Triage Vitals  Enc Vitals Group     BP 05/13/20 0051 (!) 60/44     Pulse Rate 05/13/20 0051 73     Resp 05/13/20 0051 14     Temp 05/13/20 0051 98.2 F (36.8 C)     Temp Source 05/13/20 0051 Oral     SpO2 05/13/20 0051 96 %     Weight 05/13/20 0053 200 lb (90.7 kg)     Height 05/13/20 0053 6' (1.829 m)     Head Circumference --      Peak Flow --      Pain Score 05/13/20 0052 0     Pain Loc --      Pain Edu? --      Excl. in GC? --     Constitutional: Alert and oriented. Well appearing and in no apparent distress. HEENT:      Head: Normocephalic and atraumatic.         Eyes:  Conjunctivae are normal. Sclera is non-icteric.       Mouth/Throat: Mucous membranes are dry.       Neck: Supple with no signs of meningismus. Cardiovascular: Regular rate and rhythm. No murmurs, gallops, or rubs. 2+ symmetrical distal pulses are present in all extremities. No JVD. Respiratory: Normal respiratory effort. Lungs are clear to auscultation bilaterally.  Gastrointestinal: Soft, non tender, and non distended with positive bowel sounds. No rebound or guarding. Genitourinary: No CVA tenderness. Musculoskeletal: Trace pitting edema bilateral lower extreme Neurologic: Normal speech and language. Face is symmetric. Moving all extremities. No gross focal neurologic deficits are appreciated. Skin: Skin is warm, dry and intact. No rash noted. Psychiatric: Mood and affect are normal. Speech and behavior are normal.  ____________________________________________   LABS (all labs ordered are listed, but only abnormal results are displayed)  Labs Reviewed  BRAIN NATRIURETIC PEPTIDE - Abnormal; Notable for the following components:      Result Value   B Natriuretic Peptide 219.0 (*)    All other components within normal limits  COMPREHENSIVE METABOLIC PANEL - Abnormal; Notable for the following components:   Sodium  130 (*)    Chloride 96 (*)    Glucose, Bld 152 (*)    BUN 45 (*)    Creatinine, Ser 2.49 (*)    Calcium 7.9 (*)    Total Protein 6.1 (*)    Albumin 2.7 (*)    AST 64 (*)    ALT 99 (*)    GFR, Estimated 29 (*)    All other components within normal limits  CBC WITH DIFFERENTIAL/PLATELET - Abnormal; Notable for the following components:   WBC 18.0 (*)    RBC 3.82 (*)    Hemoglobin 11.6 (*)    HCT 33.2 (*)    Neutro Abs 14.5 (*)    Monocytes Absolute 1.3 (*)    Abs Immature Granulocytes 1.06 (*)    All other components within normal limits  RESP PANEL BY RT-PCR (FLU A&B, COVID) ARPGX2  CULTURE, BLOOD (ROUTINE X 2)  CULTURE, BLOOD (ROUTINE X 2)  AMMONIA  MAGNESIUM  PROCALCITONIN  LACTIC ACID, PLASMA  URINALYSIS, COMPLETE (UACMP) WITH MICROSCOPIC  HIV ANTIBODY (ROUTINE TESTING W REFLEX)  PHOSPHORUS  LIPASE, BLOOD  TROPONIN I (HIGH SENSITIVITY)  TROPONIN I (HIGH SENSITIVITY)   ____________________________________________  EKG  ED ECG REPORT I, Nita Sickle, the attending physician, personally viewed and interpreted this ECG.  Low voltage QRS, sinus rhythm, rate of 72, first-degree AV block, normal QTC with inferior Q waves but no ST elevations, unchanged from prior ____________________________________________  RADIOLOGY  I have personally reviewed the images performed during this visit and I agree with the Radiologist's read.   Interpretation by Radiologist:  CT ABDOMEN PELVIS WO CONTRAST  Result Date: 05/13/2020 CLINICAL DATA:  Acute nonlocalized abdominal pain EXAM: CT ABDOMEN AND PELVIS WITHOUT CONTRAST TECHNIQUE: Multidetector CT imaging of the abdomen and pelvis was performed following the standard protocol without IV contrast. COMPARISON:  None. FINDINGS: Lower chest:  Dependent atelectasis.  Partially covered pacer leads. Hepatobiliary: No focal liver abnormality. Thick walled gallbladder with small calculi towards the fundus. Marked regional fat inflammation.  Best seen on reformats there appears to be a fluid density outpouching extending inferiorly from the gallbladder measuring 14 mm. Pancreas: Generalized atrophy Spleen: Unremarkable. Adrenals/Urinary Tract: Negative adrenals. No hydronephrosis or stone. Unremarkable bladder. Stomach/Bowel: Low-density wall thickening involving the duodenum and hepatic flexure. The wall is most affected closest to the gallbladder rather than along the medial  aspect, presumably secondary. No discrete colonic diverticulum is seen. Vascular/Lymphatic: Atheromatous calcification which is generalized. No mass or adenopathy. Reproductive:No pathologic findings. Other: No ascites or pneumoperitoneum. Musculoskeletal: No acute abnormalities. Advanced disc and facet degeneration of the lumbar spine with L2-3 anterolisthesis and generalized straightening elsewhere. IMPRESSION: 1. Marked right upper quadrant inflammation centered on the gallbladder and consistent with acute calculus cholecystitis. Fluid density outpouching extending inferiorly may reflect gangrenous inflammation with breakdown of the wall. Recommend right upper quadrant ultrasound. 2. Prominent inflammation at the hepatic flexure and mid duodenum which is presumed secondary. Electronically Signed   By: Marnee Spring M.D.   On: 05/13/2020 04:18   DG Chest Portable 1 View  Result Date: 05/13/2020 CLINICAL DATA:  Shortness of breath EXAM: PORTABLE CHEST 1 VIEW COMPARISON:  03/02/2019 FINDINGS: Left AICD in place with leads in the right atrium and right ventricle. Heart is normal size. Peribronchial thickening and interstitial prominence. No effusions or acute bony abnormality. IMPRESSION: Peribronchial thickening and interstitial prominence, likely bronchitic changes. Electronically Signed   By: Charlett Nose M.D.   On: 05/13/2020 01:14     ____________________________________________   PROCEDURES  Procedure(s) performed:yes .1-3 Lead EKG Interpretation Performed by:  Nita Sickle, MD Authorized by: Nita Sickle, MD     Interpretation: abnormal     Rhythm: sinus rhythm     Ectopy: none     Conduction: abnormal     Critical Care performed: yes  CRITICAL CARE Performed by: Nita Sickle  ?  Total critical care time: 60 min  Critical care time was exclusive of separately billable procedures and treating other patients.  Critical care was necessary to treat or prevent imminent or life-threatening deterioration.  Critical care was time spent personally by me on the following activities: development of treatment plan with patient and/or surrogate as well as nursing, discussions with consultants, evaluation of patient's response to treatment, examination of patient, obtaining history from patient or surrogate, ordering and performing treatments and interventions, ordering and review of laboratory studies, ordering and review of radiographic studies, pulse oximetry and re-evaluation of patient's condition.  ____________________________________________   INITIAL IMPRESSION / ASSESSMENT AND PLAN / ED COURSE  60 y.o. male with a history of CHF the EF of 25% status post defibrillator, cirrhosis of the liver, COPD, hypertension, hyperlipidemia  who presents for evaluation of lightheadedness and shortness of breath that started this evening in the setting of 3 days of nausea and vomiting.  Patient hypotensive with systolics in the 60s, trace edema, normal work of breathing normal sats with clear lungs, abdomen is soft and nontender.  EKG showing no signs of acute ischemia or dysrhythmias.  Chest x-ray visualized by me showing no acute findings, confirmed by radiology.  White count is elevated at 18 with a left shift, patient with AKI with a creatinine of 2.49 (patient's creatinine baseline is between 0.6 and 1), hypocalcemia. Presentation concerning for viral illness. Covid and flu pending. Dehydration and AKI with rehydrate slowly due to EF  20%. Procalcitonin sent, will hold off abx at this time as this is most likely viral.  No signs of respiratory distress with sats of 94% on room air.  Anticipate admission.  Old medical records reviewed.  Patient placed on telemetry for close monitoring of cardiorespiratory status.  _________________________ 3:01 AM on 05/13/2020 -----------------------------------------  After 250 cc bolus patient still hypotensive.  He was given a 500 cc bolus.  Still hypotensive.  Will finish the liter and start patient on norepinephrine.  His procalcitonin  is elevated and white count is elevated therefore even though he does not have a fever or tachycardia I am concerned for sepsis.  Will cover with broad-spectrum antibiotics and get a CT abdomen pelvis.  Patient has not made any urine yet but a urinalysis will be sent as well.  Discussed with Sonda Rumble, NP ICU who will admit patient to the unit.    _________________________ 4:31 AM on 05/13/2020 -----------------------------------------  CT concerning for cholecystitis which is most likely the source of patient's sepsis.  Patient covered with Zosyn and Vanco.  Discussed with Dr. Tonna Boehringer from surgery who had already been contacted by ICU. He is on his way to evaluate patient.  _____________________________________________ Please note:  Patient was evaluated in Emergency Department today for the symptoms described in the history of present illness. Patient was evaluated in the context of the global COVID-19 pandemic, which necessitated consideration that the patient might be at risk for infection with the SARS-CoV-2 virus that causes COVID-19. Institutional protocols and algorithms that pertain to the evaluation of patients at risk for COVID-19 are in a state of rapid change based on information released by regulatory bodies including the CDC and federal and state organizations. These policies and algorithms were followed during the patient's care in the ED.   Some ED evaluations and interventions may be delayed as a result of limited staffing during the pandemic.   [place] Controlled Substance Database was reviewed by me. ____________________________________________   FINAL CLINICAL IMPRESSION(S) / ED DIAGNOSES   Final diagnoses:  Non-intractable vomiting with nausea, unspecified vomiting type  AKI (acute kidney injury) (HCC)  Viral illness  Sepsis with acute renal failure and septic shock, due to unspecified organism, unspecified acute renal failure type (HCC)  Abnormal CT scan  Acute cholecystitis      NEW MEDICATIONS STARTED DURING THIS VISIT:  ED Discharge Orders    None       Note:  This document was prepared using Dragon voice recognition software and may include unintentional dictation errors.    Don Perking, [place], MD 05/13/20 [phone]

## 2020-05-13 NOTE — Consult Note (Signed)
Chief Complaint: Acute cholecystitis. Request is for cholecystomy tube placement  Referring Physician(s): Dr. Tonna Boehringer  Supervising Physician: Irish Lack  Patient Status: [facility] - In-pt  History of Present Illness: Brent [name] is a 60 y.o. male smoker. History of CHF, chronic pain, COPD, Hep C, HLD, HTN, subarachnoid hemorrhage, spinal stenosis, cirrhosis. Presented to the ED at [facility]  With nausea and vomiting X 3 days and  Dizziness X 1 day, SHOB. Patient was found to be sepsis with acute cholecystitis as the likely source. After evaluation by general surgery it was deemed that the patient was not a surgical candidate. Team is requesting a cholecystomy tube placement.  Past Medical History:  Diagnosis Date  . CHF (congestive heart failure) (HCC)   . Chronic pain   . COPD (chronic obstructive pulmonary disease) (HCC)   . Depression   . Hepatitis C   . Hyperlipidemia   . Hypertension   . Spinal stenosis   . Subarachnoid hemorrhage [facility])     Past Surgical History:  Procedure Laterality Date  . LEFT HEART CATH AND CORONARY ANGIOGRAPHY N/A 03/04/2019   Procedure: LEFT HEART CATH AND CORONARY ANGIOGRAPHY;  Surgeon: Lamar Blinks, MD;  Location: [facility] INVASIVE CV LAB;  Service: Cardiovascular;  Laterality: N/A;    Allergies: Patient has no known allergies.  Medications: Prior to Admission medications   Medication Sig Start Date End Date Taking? Authorizing Provider  albuterol (VENTOLIN HFA) 108 (90 Base) MCG/ACT inhaler Inhale 2 puffs into the lungs every 6 (six) hours as needed. 06/05/19 06/04/20 Yes [provider]  amiodarone (PACERONE) 400 MG tablet Take 1 tablet (400 mg total) by mouth daily. 08/17/19  Yes Clarisa Kindred A, FNP  aspirin EC 81 MG EC tablet Take 1 tablet (81 mg total) by mouth daily. 03/06/19  Yes Danford, Earl Lites, MD  atorvastatin (LIPITOR) 80 MG tablet Take 1 tablet (80 mg total) by mouth daily at 6 PM. 08/17/19  Yes Hackney, Jarold Song, FNP   budesonide-formoterol (SYMBICORT) 160-4.5 MCG/ACT inhaler Inhale 2 puffs into the lungs 2 (two) times daily. 06/05/19 06/04/20 Yes [provider]  buPROPion (WELLBUTRIN XL) 150 MG 24 hr tablet Take 150 mg by mouth daily.   Yes [provider]  carvedilol (COREG) 3.125 MG tablet Take 1 tablet (3.125 mg total) by mouth 2 (two) times daily with a meal. 12/21/19  Yes Hackney, Tina A, FNP  cyclobenzaprine (FLEXERIL) 10 MG tablet Take 5-10 mg by mouth 2 (two) times daily.   Yes [provider]  diclofenac Sodium (VOLTAREN) 1 % GEL Apply 2 g topically 4 (four) times daily as needed. 04/06/20  Yes [provider]  empagliflozin (JARDIANCE) 10 MG TABS tablet Take 1 tablet (10 mg total) by mouth daily before breakfast. 12/21/19  Yes Clarisa Kindred A, FNP  gabapentin (NEURONTIN) 300 MG capsule Take 300 mg by mouth 2 (two) times daily.   Yes [provider]  isosorbide mononitrate (IMDUR) 30 MG 24 hr tablet Take 30 mg by mouth daily.   Yes [provider]  naproxen sodium (ALEVE) 220 MG tablet Take 220 mg by mouth daily as needed.   Yes [provider]  nicotine (NICODERM CQ - DOSED IN MG/24 HOURS) 21 mg/24hr patch Place 1 patch (21 mg total) onto the skin daily. 04/06/20  Yes Hackney, Inetta Fermo A, FNP  oxyCODONE (ROXICODONE) 15 MG immediate release tablet Take 1 tablet (15 mg total) by mouth every 4 (four) hours as needed for moderate pain. 03/05/19  Yes  Danford, Earl Lites, MD  sacubitril-valsartan (ENTRESTO) 97-103 MG Take 1 tablet by mouth 2 (two) times daily. 12/21/19  Yes Clarisa Kindred A, FNP  spironolactone (ALDACTONE) 25 MG tablet Take 1 tablet (25 mg total) by mouth daily. 08/17/19  Yes Hackney, Inetta Fermo A, FNP  nitroGLYCERIN (NITROSTAT) 0.4 MG SL tablet Place 1 tablet (0.4 mg total) under the tongue every 5 (five) minutes x 3 doses as needed for chest pain. 03/05/19   Danford, Earl Lites, MD     History reviewed. No pertinent family  history.  Social History   Socioeconomic History  . Marital status: Married    Spouse name: Not on file  . Number of children: Not on file  . Years of education: Not on file  . Highest education level: Not on file  Occupational History  . Not on file  Tobacco Use  . Smoking status: Current Every Day Smoker    Packs/day: 2.00    Types: Cigarettes  . Smokeless tobacco: Never Used  Substance and Sexual Activity  . Alcohol use: Not Currently  . Drug use: Not Currently  . Sexual activity: Not Currently  Other Topics Concern  . Not on file  Social History Narrative  . Not on file   Social Determinants of Health   Financial Resource Strain: Not on file  Food Insecurity: Not on file  Transportation Needs: Not on file  Physical Activity: Not on file  Stress: Not on file  Social Connections: Not on file     Review of Systems: A 12 point ROS discussed and pertinent positives are indicated in the HPI above.  All other systems are negative.  Review of Systems  Constitutional: Negative for fever.  HENT: Negative for congestion.   Respiratory: Negative for cough and shortness of breath.   Cardiovascular: Negative for chest pain.  Gastrointestinal: Negative for abdominal pain.  Musculoskeletal: Positive for arthralgias ( shoulder pain) and back pain.  Neurological: Negative for headaches.  Psychiatric/Behavioral: Negative for behavioral problems and confusion.    Vital Signs: BP (!) 88/52   Pulse 80   Temp 98.5 F (36.9 C) (Oral)   Resp 19   Ht 6' (1.829 m)   Wt 200 lb (90.7 kg)   SpO2 100%   BMI 27.12 kg/m   Physical Exam Vitals and nursing note reviewed.  Constitutional:      Appearance: He is well-developed.  HENT:     Head: Normocephalic.  Cardiovascular:     Rate and Rhythm: Normal rate and regular rhythm.  Pulmonary:     Effort: Pulmonary effort is normal.     Breath sounds: Normal breath sounds.  Musculoskeletal:        General: Normal range of motion.      Cervical back: Normal range of motion.  Skin:    Coloration: Skin is pale.     Comments: Diaphoretic.   Neurological:     Mental Status: He is alert and oriented to person, place, and time.     Imaging: CT ABDOMEN PELVIS WO CONTRAST  Result Date: 05/13/2020 CLINICAL DATA:  Acute nonlocalized abdominal pain EXAM: CT ABDOMEN AND PELVIS WITHOUT CONTRAST TECHNIQUE: Multidetector CT imaging of the abdomen and pelvis was performed following the standard protocol without IV contrast. COMPARISON:  None. FINDINGS: Lower chest:  Dependent atelectasis.  Partially covered pacer leads. Hepatobiliary: No focal liver abnormality. Thick walled gallbladder with small calculi towards the fundus. Marked regional fat inflammation. Best seen on reformats there appears to be a fluid density  outpouching extending inferiorly from the gallbladder measuring 14 mm. Pancreas: Generalized atrophy Spleen: Unremarkable. Adrenals/Urinary Tract: Negative adrenals. No hydronephrosis or stone. Unremarkable bladder. Stomach/Bowel: Low-density wall thickening involving the duodenum and hepatic flexure. The wall is most affected closest to the gallbladder rather than along the medial aspect, presumably secondary. No discrete colonic diverticulum is seen. Vascular/Lymphatic: Atheromatous calcification which is generalized. No mass or adenopathy. Reproductive:No pathologic findings. Other: No ascites or pneumoperitoneum. Musculoskeletal: No acute abnormalities. Advanced disc and facet degeneration of the lumbar spine with L2-3 anterolisthesis and generalized straightening elsewhere. IMPRESSION: 1. Marked right upper quadrant inflammation centered on the gallbladder and consistent with acute calculus cholecystitis. Fluid density outpouching extending inferiorly may reflect gangrenous inflammation with breakdown of the wall. Recommend right upper quadrant ultrasound. 2. Prominent inflammation at the hepatic flexure and mid duodenum which is  presumed secondary. Electronically Signed   By: Marnee Spring M.D.   On: 05/13/2020 04:18   DG Chest Portable 1 View  Result Date: 05/13/2020 CLINICAL DATA:  Shortness of breath EXAM: PORTABLE CHEST 1 VIEW COMPARISON:  03/02/2019 FINDINGS: Left AICD in place with leads in the right atrium and right ventricle. Heart is normal size. Peribronchial thickening and interstitial prominence. No effusions or acute bony abnormality. IMPRESSION: Peribronchial thickening and interstitial prominence, likely bronchitic changes. Electronically Signed   By: Charlett Nose M.D.   On: 05/13/2020 01:14   US ABDOMEN LIMITED RUQ (LIVER/GB)  Result Date: 05/13/2020 CLINICAL DATA:  Acute abdominal pain with possible cholecystitis on recent CT scan. EXAM: ULTRASOUND ABDOMEN LIMITED RIGHT UPPER QUADRANT COMPARISON:  CT abdomen/pelvis 05/13/2020 FINDINGS: Gallbladder: Small mobile echogenic foci with posterior acoustic shadowing as well as mildly echogenic sludge layers within the gallbladder lumen. Per the sonographer, the sonographic Eulah Pont sign was positive. The gallbladder wall is diffusely thickened at 6 mm. Small amount of pericholecystic fluid visualized between the gallbladder wall and liver surface. Common bile duct: Diameter: Within normal limits at 4-6 mm Liver: Within normal limits in parenchymal echogenicity. Small circumscribed 1.2 x 1.4 x 1.6 cm echogenic subcapsular nodule in the right hepatic lobe. Portal vein is patent on color Doppler imaging with normal direction of blood flow towards the liver. Other: None. IMPRESSION: 1. Sonographic findings are consistent with acute calculus cholecystitis in the appropriate clinical setting. 2. Circumscribed 1.6 cm echogenic subcapsular nodule in the right hepatic lobe. Imaging features are most suggestive of a benign hemangioma, however ultrasound is not definitively diagnostic. Consider repeat ultrasound in 6 months to ensure stability. Electronically Signed   By: Malachy Moan M.D.   On: 05/13/2020 05:36    Labs:  CBC: Recent Labs    05/13/20 0055  WBC 18.0*  HGB 11.6*  HCT 33.2*  PLT 205    COAGS: Recent Labs    05/13/20 0625  INR 1.2    BMP: Recent Labs    06/16/19 1053 08/17/19 1148 11/20/19 0944 12/21/19 0925 05/13/20 0055  NA 140 135 136 140 130*  K 4.0 4.1 4.6 4.2 3.6  CL 103 101 99 103 96*  CO2 28 27 29 29 24   GLUCOSE 92 118* 111* 87 152*  BUN 19 20 20 20  45*  CALCIUM 8.9 9.2 8.7* 8.8* 7.9*  CREATININE 0.73 0.96 0.90 0.96 2.49*  GFRNONAA >60 >60 >60 >60 29*  GFRAA >60 >60  --   --   --     LIVER FUNCTION TESTS: Recent Labs    05/13/20 0055  BILITOT 1.1  AST 64*  ALT 99*  ALKPHOS 82  PROT 6.1*  ALBUMIN 2.7*    Assessment and Plan:  60 y.o. male inpatient. Smoker. History of CHF, chronic pain, COPD, Hep C, HLD, HTN, subarachnoid hemorrhage, spinal stenosis, cirrhosis. Presented to the ED at [facility]  With nausea and vomiting X 3 days and  Dizziness X 1 day, SHOB. Patient was found to be sepsis with acute cholecystitis as the likely source. After evaluation by general surgery it was deemed that the patient was not a surgical candidate. Team is requesting a cholecystomy tube placement.  [place] from 4.8.22 reads Sonographic findings are consistent with acute calculus cholecystitis in the appropriate clinical setting. CT abd pelvis from 4.8.22 reads Marked right upper quadrant inflammation centered on the gallbladder and consistent with acute calculus cholecystitis. Fluid density outpouching extending inferiorly may reflect gangrenous inflammation with breakdown Sodium 130, BUN 45, Cr 2.49,A ST 64. ALT 99, WBC is 18.0 (up trending) INR 1.2, COVID -, microbiology shows no growth in 12 hours. Patient is on subcutaneous prophylactic dose of heparin. Last dose given on 4.8.22 @ 05:33. Per RN at bedside patient as been NPO since midnight. Due to dropping pressures Team placing a central line at bedside and will be adding a second  medication. Patient currently taking levophed and zosyn. Last recorded temperature post central line insertion is 89/42  IR consulted for possible cholecystomy tube placement. Case has been reviewed and procedure approved by Dr. Fredia Sorrow.  Patient tentatively scheduled for 4.8.22 once patient has stabilized.   Team instructed to: Keep Patient to be NPO after midnight Hold prophylactic anticoagulation 24 hours prior to scheduled procedure.  IR will call patient when ready.  Risks and benefits discussed with the patient including, but not limited to bleeding, infection, gallbladder perforation, bile leak, sepsis or even death.  All of the patient's questions were answered, patient is agreeable to proceed. Consent signed and in chart.  Thank you for this interesting consult.  I greatly enjoyed meeting Jino Marks and look forward to participating in their care.  A copy of this report was sent to the requesting provider on this date.  Electronically Signed: Alene Mires, NP 05/13/2020, 9:15 AM   I spent a total of 40 Minutes    in face to face in clinical consultation, greater than 50% of which was counseling/coordinating care for cholecystomy tube placement

## 2020-05-13 NOTE — Progress Notes (Addendum)
Pharmacy Antibiotic Note  Jocelyn Lowery is a 60 y.o. male admitted on 05/13/2020 with sepsis.  Pharmacy has been consulted for Zosyn dosing.  Plan: Zosyn 3.375g IV q8h (4 hour infusion). per indication and pt renal fxn.  Pharmacy will continue to follow SCr and adjusted abx dosing if warranted.  Height: 6' (182.9 cm) Weight: 90.7 kg (200 lb) IBW/kg (Calculated) : 77.6  Temp (24hrs), Avg:98.2 F (36.8 C), Min:98.2 F (36.8 C), Max:98.2 F (36.8 C)  Recent Labs  Lab 05/13/20 0055  WBC 18.0*  CREATININE 2.49*    Estimated Creatinine Clearance: 35.5 mL/min (A) (by C-G formula based on SCr of 2.49 mg/dL (H)).    No Known Allergies  Antimicrobials this admission: 04/08 Cefepime >> x 0 (DCd, changed to Zosyn) 04/08 Vancomycin >> x 1 04/08 Zosyn >>  Microbiology results: 04/08 BCx: Pending  Thank you for allowing pharmacy to be a part of this patient's care.  Otelia Sergeant, PharmD, [facility] 05/13/2020 3:32 AM

## 2020-05-13 NOTE — Progress Notes (Signed)
PHARMACY -  BRIEF ANTIBIOTIC NOTE   Pharmacy has received consult(s) for Cefepime and Vancomcyin from an ED provider.  The patient's profile has been reviewed for ht/wt/allergies/indication/available labs.    One time order(s) placed for Cefepime 2 gm and LD of Vanc 2250 mg per pt wt: 90.7 kg.  Further antibiotics/pharmacy consults should be ordered by admitting physician if indicated.        Otelia Sergeant, PharmD, [facility] 05/13/2020 3:10 AM

## 2020-05-13 NOTE — Progress Notes (Signed)
CODE SEPSIS - PHARMACY COMMUNICATION  **Broad Spectrum Antibiotics should be administered within 1 hour of Sepsis diagnosis**  Time Code Sepsis Called/Page Received: 0303  Abx Ordered: Vancomycin and Zosyn (Cefepime DCd)  Time of 1st antibiotic administration: 3559  Otelia Sergeant, PharmD, [facility] 05/13/2020 3:09 AM

## 2020-05-13 NOTE — Sepsis Progress Note (Signed)
Monitoring for code sepsis protocol. 

## 2020-05-13 NOTE — ED Triage Notes (Signed)
Pt presents to ER via ems c/o SOB, dizziness and emesis x3 days.  Pt called out for SOB which started tonight.  Ems reports when they arrived, pt was diaphoretic, and pale and initial BP was in the 70s systolic.  Pt currently A&Ox4, in no acute distress.  Pt placed on 2L [place] with ems d/t O2 sat of 88% on RA.  Pt does not wear O2 at home.

## 2020-05-13 NOTE — Progress Notes (Signed)
Patient clinically stable though on levophed gtt for procedure,ie chole tube placement per DR Fredia Sorrow, tolerated well. Carla RN ICU nurse with patient during procedure monitoring Levophed gtt.bp remained stable during and after procedure. Received Versed 2 mg along with Fentanyl 100 mcg IV for procedure. ICU nurse remained with patient during entire procedure. Post procedure returned patient back to ICU 13 with care nurse.

## 2020-05-13 NOTE — ED Notes (Signed)
US at bedside

## 2020-05-13 NOTE — Consult Note (Signed)
Subjective:   CC: acute cholecystitis  HPI:  Brent [name] is a 60 y.o. male who is consulted by [facility] for evaluation of above cc.  Symptoms were first noted 1 week ago. Pain is dull, intermittent, RUQ.  Associated with nausea, exacerbated by nothing specific.  Noted to be hypotensive on field after concern of weakness, s/p fluid bolus and currently on pressors.      Past Medical History:  has a past medical history of CHF (congestive heart failure) (HCC), Chronic pain, COPD (chronic obstructive pulmonary disease) (HCC), Depression, Hepatitis C, Hyperlipidemia, Hypertension, Spinal stenosis, and Subarachnoid hemorrhage (HCC). EF 15-20% on last echo   Past Surgical History:  has a past surgical history that includes LEFT HEART CATH AND CORONARY ANGIOGRAPHY (N/A, 03/04/2019).   Family History: reviewed and not relevant to CC  Social History:  reports that he has been smoking cigarettes. He has been smoking about 2.00 packs per day. He has never used smokeless tobacco. He reports previous alcohol use. He reports previous drug use.  Current Medications:  Prior to Admission medications   Medication Sig Start Date End Date Taking? Authorizing Provider  albuterol (VENTOLIN HFA) 108 (90 Base) MCG/ACT inhaler Inhale 2 puffs into the lungs every 6 (six) hours as needed. 06/05/19 06/04/20 Yes [provider]  amiodarone (PACERONE) 400 MG tablet Take 1 tablet (400 mg total) by mouth daily. 08/17/19  Yes Clarisa Kindred A, FNP  aspirin EC 81 MG EC tablet Take 1 tablet (81 mg total) by mouth daily. 03/06/19  Yes Danford, Earl Lites, MD  atorvastatin (LIPITOR) 80 MG tablet Take 1 tablet (80 mg total) by mouth daily at 6 PM. 08/17/19  Yes Hackney, Jarold Song, FNP  budesonide-formoterol (SYMBICORT) 160-4.5 MCG/ACT inhaler Inhale 2 puffs into the lungs 2 (two) times daily. 06/05/19 06/04/20 Yes [provider]  buPROPion (WELLBUTRIN XL) 150 MG 24 hr tablet Take 150 mg by mouth daily.   Yes [provider]  carvedilol (COREG) 3.125 MG tablet Take 1 tablet (3.125 mg total) by mouth 2 (two) times daily with a meal. 12/21/19  Yes Hackney, Tina A, FNP  cyclobenzaprine (FLEXERIL) 10 MG tablet Take 5-10 mg by mouth 2 (two) times daily.   Yes [provider]  diclofenac Sodium (VOLTAREN) 1 % GEL Apply 2 g topically 4 (four) times daily as needed. 04/06/20  Yes [provider]  empagliflozin (JARDIANCE) 10 MG TABS tablet Take 1 tablet (10 mg total) by mouth daily before breakfast. 12/21/19  Yes Clarisa Kindred A, FNP  gabapentin (NEURONTIN) 300 MG capsule Take 300 mg by mouth 2 (two) times daily.   Yes [provider]  isosorbide mononitrate (IMDUR) 30 MG 24 hr tablet Take 30 mg by mouth daily.   Yes [provider]  naproxen sodium (ALEVE) 220 MG tablet Take 220 mg by mouth daily as needed.   Yes [provider]  nicotine (NICODERM CQ - DOSED IN MG/24 HOURS) 21 mg/24hr patch Place 1 patch (21 mg total) onto the skin daily. 04/06/20  Yes Hackney, Inetta Fermo A, FNP  oxyCODONE (ROXICODONE) 15 MG immediate release tablet Take 1 tablet (15 mg total) by mouth every 4 (four) hours as needed for moderate pain. 03/05/19  Yes Danford, Earl Lites, MD  sacubitril-valsartan (ENTRESTO) 97-103 MG Take 1 tablet by mouth 2 (two) times daily. 12/21/19  Yes Clarisa Kindred A, FNP  spironolactone (ALDACTONE) 25 MG tablet Take 1 tablet (25 mg total) by mouth daily. 08/17/19  Yes Delma Freeze, FNP  nitroGLYCERIN (NITROSTAT) 0.4 MG SL tablet Place 1 tablet (0.4 mg total) under the tongue every 5 (five) minutes x 3 doses as needed for chest pain. 03/05/19   Danford, Earl Lites, MD    Allergies:  Allergies as of 05/13/2020  . (No Known Allergies)    ROS:  General: Denies weight loss, weight gain, fatigue, fevers, chills, and night sweats. Eyes: Denies blurry vision, double vision, eye pain, itchy eyes, and tearing. Ears: Denies hearing loss, earache, and ringing in  ears. Nose: Denies sinus pain, congestion, infections, runny nose, and nosebleeds. Mouth/throat: Denies hoarseness, sore throat, bleeding gums, and difficulty swallowing. Heart: Denies chest pain, palpitations, racing heart, irregular heartbeat, leg pain or swelling, and decreased activity tolerance. Respiratory: Denies breathing difficulty, shortness of breath, wheezing, cough, and sputum. GI: Denies change in appetite, heartburn, nausea, vomiting, constipation, diarrhea, and blood in stool. GU: Denies difficulty urinating, pain with urinating, urgency, frequency, blood in urine. Musculoskeletal: Denies joint stiffness, pain, swelling, muscle weakness. Skin: Denies rash, itching, mass, tumors, sores, and boils Neurologic: Denies headache, fainting, dizziness, seizures, numbness, and tingling. Psychiatric: Denies depression, anxiety, difficulty sleeping, and memory loss. Endocrine: Denies heat or cold intolerance, and increased thirst or urination. Blood/lymph: Denies easy bruising, and swollen glands     Objective:     BP (!) 122/92   Pulse 74   Temp 98.2 F (36.8 C) (Oral)   Resp 12   Ht 6' (1.829 m)   Wt 90.7 kg   SpO2 98%   BMI 27.12 kg/m    Constitutional :  alert, cooperative, appears stated age and no distress  Lymphatics/Throat:  no asymmetry, masses, or scars  Respiratory:  clear to auscultation bilaterally  Cardiovascular:  regular rate and rhythm  Gastrointestinal: soft, no guarding, minimal TTP in RUQ.   Musculoskeletal: Steady movement  Skin: Cool and moist  Psychiatric: Normal affect, non-agitated, not confused       LABS:  CMP Latest Ref Rng & Units 05/13/2020 12/21/2019 11/20/2019  Glucose 70 - 99 mg/dL 992(E) 87 268(T)  BUN 6 - 20 mg/dL 41(D) 20 20  Creatinine 0.61 - 1.24 mg/dL 6.22(W) 9.79 8.92  Sodium 135 - 145 mmol/L 130(L) 140 136  Potassium 3.5 - 5.1 mmol/L 3.6 4.2 4.6  Chloride 98 - 111 mmol/L 96(L) 103 99  CO2 22 - 32 mmol/L 24 29 29   Calcium  8.9 - 10.3 mg/dL 7.9(L) 8.8(L) 8.7(L)  Total Protein 6.5 - 8.1 g/dL 6.1(L) - -  Total Bilirubin 0.3 - 1.2 mg/dL 1.1 - -  Alkaline Phos 38 - 126 U/L 82 - -  AST 15 - 41 U/L 64(H) - -  ALT 0 - 44 U/L 99(H) - -   CBC Latest Ref Rng & Units 05/13/2020 03/05/2019 03/04/2019  WBC 4.0 - 10.5 K/uL 18.0(H) 10.1 10.5  Hemoglobin 13.0 - 17.0 g/dL 11.6(L) 12.9(L) 12.2(L)  Hematocrit 39.0 - 52.0 % 33.2(L) 38.7(L) 37.1(L)  Platelets 150 - 400 K/uL 205 241 197     RADS: CLINICAL DATA:  Acute nonlocalized abdominal pain  EXAM: CT ABDOMEN AND PELVIS WITHOUT CONTRAST  TECHNIQUE: Multidetector CT imaging of the abdomen and pelvis was performed following the standard protocol without IV contrast.  COMPARISON:  None.  FINDINGS: Lower chest:  Dependent atelectasis.  Partially covered pacer leads.  Hepatobiliary: No focal liver abnormality.  Thick walled gallbladder with small calculi towards the fundus. Marked regional fat inflammation. Best seen on reformats there appears to be a fluid density outpouching extending inferiorly from the  gallbladder measuring 14 mm.  Pancreas: Generalized atrophy  Spleen: Unremarkable.  Adrenals/Urinary Tract: Negative adrenals. No hydronephrosis or stone. Unremarkable bladder.  Stomach/Bowel: Low-density wall thickening involving the duodenum and hepatic flexure. The wall is most affected closest to the gallbladder rather than along the medial aspect, presumably secondary. No discrete colonic diverticulum is seen.  Vascular/Lymphatic: Atheromatous calcification which is generalized. No mass or adenopathy.  Reproductive:No pathologic findings.  Other: No ascites or pneumoperitoneum.  Musculoskeletal: No acute abnormalities. Advanced disc and facet degeneration of the lumbar spine with L2-3 anterolisthesis and generalized straightening elsewhere.  IMPRESSION: 1. Marked right upper quadrant inflammation centered on the gallbladder and  consistent with acute calculus cholecystitis. Fluid density outpouching extending inferiorly may reflect gangrenous inflammation with breakdown of the wall. Recommend right upper quadrant ultrasound. 2. Prominent inflammation at the hepatic flexure and mid duodenum which is presumed secondary.   Electronically Signed   By: Marnee Spring M.D.   On: 05/13/2020 04:18  Assessment:      Acute cholecystitis, likely cause of below Elevated Creatinine Septic shock requiring pressors  Plan:   With patient's severe comorbidities of CHF with EF 15-20%, Hx of heart catherization, COPD, current smoker, and recent aspirin use, Pt is at high risk of perioperative complications.  Recommend IR guided cholecystostomy tube placement for sepsis resolution, and re-eval in 6-8 weeks after medial optimization if possible prior to considering elective cholecystectomy.  Continue IV abx and supportive care per ICU team.

## 2020-05-13 NOTE — ED Notes (Signed)
Pt transported to CT by this RN.

## 2020-05-14 DIAGNOSIS — K810 Acute cholecystitis: Secondary | ICD-10-CM | POA: Diagnosis not present

## 2020-05-14 DIAGNOSIS — R6521 Severe sepsis with septic shock: Secondary | ICD-10-CM

## 2020-05-14 DIAGNOSIS — N179 Acute kidney failure, unspecified: Secondary | ICD-10-CM | POA: Diagnosis not present

## 2020-05-14 DIAGNOSIS — A419 Sepsis, unspecified organism: Principal | ICD-10-CM

## 2020-05-14 LAB — GLUCOSE, CAPILLARY
Glucose-Capillary: 128 mg/dL — ABNORMAL HIGH (ref 70–99)
Glucose-Capillary: 132 mg/dL — ABNORMAL HIGH (ref 70–99)
Glucose-Capillary: 168 mg/dL — ABNORMAL HIGH (ref 70–99)
Glucose-Capillary: 177 mg/dL — ABNORMAL HIGH (ref 70–99)
Glucose-Capillary: 181 mg/dL — ABNORMAL HIGH (ref 70–99)
Glucose-Capillary: 181 mg/dL — ABNORMAL HIGH (ref 70–99)

## 2020-05-14 LAB — CBC WITH DIFFERENTIAL/PLATELET
Abs Immature Granulocytes: 0.31 10*3/uL — ABNORMAL HIGH (ref 0.00–0.07)
Basophils Absolute: 0.1 10*3/uL (ref 0.0–0.1)
Basophils Relative: 0 %
Eosinophils Absolute: 0 10*3/uL (ref 0.0–0.5)
Eosinophils Relative: 0 %
HCT: 39.3 % (ref 39.0–52.0)
Hemoglobin: 13.5 g/dL (ref 13.0–17.0)
Immature Granulocytes: 1 %
Lymphocytes Relative: 3 %
Lymphs Abs: 0.8 10*3/uL (ref 0.7–4.0)
MCH: 29.9 pg (ref 26.0–34.0)
MCHC: 34.4 g/dL (ref 30.0–36.0)
MCV: 87.1 fL (ref 80.0–100.0)
Monocytes Absolute: 1.5 10*3/uL — ABNORMAL HIGH (ref 0.1–1.0)
Monocytes Relative: 6 %
Neutro Abs: 22.6 10*3/uL — ABNORMAL HIGH (ref 1.7–7.7)
Neutrophils Relative %: 90 %
Platelets: 292 10*3/uL (ref 150–400)
RBC: 4.51 MIL/uL (ref 4.22–5.81)
RDW: 13.2 % (ref 11.5–15.5)
Smear Review: NORMAL
WBC: 25.6 10*3/uL — ABNORMAL HIGH (ref 4.0–10.5)
nRBC: 0 % (ref 0.0–0.2)

## 2020-05-14 LAB — RENAL FUNCTION PANEL
Albumin: 2.5 g/dL — ABNORMAL LOW (ref 3.5–5.0)
Anion gap: 9 (ref 5–15)
BUN: 28 mg/dL — ABNORMAL HIGH (ref 6–20)
CO2: 26 mmol/L (ref 22–32)
Calcium: 8.3 mg/dL — ABNORMAL LOW (ref 8.9–10.3)
Chloride: 102 mmol/L (ref 98–111)
Creatinine, Ser: 1.06 mg/dL (ref 0.61–1.24)
GFR, Estimated: >60 mL/min (ref >60)
Glucose, Bld: 156 mg/dL — ABNORMAL HIGH (ref 70–99)
Phosphorus: 3.4 mg/dL (ref 2.5–4.6)
Potassium: 3.8 mmol/L (ref 3.5–5.1)
Sodium: 137 mmol/L (ref 135–145)

## 2020-05-14 LAB — MAGNESIUM: Magnesium: 2.9 mg/dL — ABNORMAL HIGH (ref 1.7–2.4)

## 2020-05-14 LAB — HEMOGLOBIN A1C
Hgb A1c MFr Bld: 6.1 % — ABNORMAL HIGH (ref 4.8–5.6)
Mean Plasma Glucose: 128.37 mg/dL

## 2020-05-14 MED ORDER — INSULIN ASPART 100 UNIT/ML ~~LOC~~ SOLN
0.0000 [IU] | SUBCUTANEOUS | Status: DC
  Administered 2020-05-14: 3 [IU] via SUBCUTANEOUS
  Administered 2020-05-14: 2 [IU] via SUBCUTANEOUS
  Administered 2020-05-14: 3 [IU] via SUBCUTANEOUS
  Administered 2020-05-14: 2 [IU] via SUBCUTANEOUS
  Administered 2020-05-14 (×2): 3 [IU] via SUBCUTANEOUS
  Administered 2020-05-15: 5 [IU] via SUBCUTANEOUS
  Administered 2020-05-15: 3 [IU] via SUBCUTANEOUS
  Administered 2020-05-15: 2 [IU] via SUBCUTANEOUS
  Administered 2020-05-15: 3 [IU] via SUBCUTANEOUS
  Administered 2020-05-16: 2 [IU] via SUBCUTANEOUS
  Administered 2020-05-16 (×3): 3 [IU] via SUBCUTANEOUS
  Administered 2020-05-16: 2 [IU] via SUBCUTANEOUS
  Administered 2020-05-17: 3 [IU] via SUBCUTANEOUS
  Administered 2020-05-17: 2 [IU] via SUBCUTANEOUS
  Filled 2020-05-14 (×16): qty 1

## 2020-05-14 NOTE — Consult Note (Signed)
PHARMACY CONSULT NOTE - FOLLOW UP  Pharmacy Consult for Electrolyte Monitoring and Replacement   Recent Labs: Potassium (mmol/L)  Date Value  05/14/2020 3.8   Magnesium (mg/dL)  Date Value  07/29/7626 2.9 (H)   Calcium (mg/dL)  Date Value  31/51/7616 8.3 (L)   Albumin (g/dL)  Date Value  07/37/1062 2.5 (L)   Phosphorus (mg/dL)  Date Value  69/48/5462 3.4   Sodium (mmol/L)  Date Value  05/14/2020 137   Corrected Ca: 8.9  Assessment: 60 yo M presented with shortness of breath, dizziness, and emesis x 3 days. PMH includes CHF with EF of 25% s/p defibrillator, cirrhosis, COPD, HTN, and HLD. Pharmacy consulted to monitor and replace electrolytes.  IV fluids d/c;ed.   Goal of Therapy:  K 4.0-5.1 Mg 2.0-2.4 Electrolytes WNL  Plan:  No replacement needed at this time.  --Monitor electrolytes with AM labs  Ronnald Ramp, PharmD 05/14/2020 9:18 AM

## 2020-05-14 NOTE — Plan of Care (Incomplete)
Neuro: Resp: CV: GIGU: Skin: Social:  Events:   Problem: Education: Goal: Knowledge of General Education information will improve Description: Including pain rating scale, medication(s)/side effects and non-pharmacologic comfort measures Outcome: Progressing   Problem: Health Behavior/Discharge Planning: Goal: Ability to manage health-related needs will improve Outcome: Progressing   Problem: Clinical Measurements: Goal: Ability to maintain clinical measurements within normal limits will improve Outcome: Progressing Goal: Will remain free from infection Outcome: Progressing Goal: Diagnostic test results will improve Outcome: Progressing Goal: Respiratory complications will improve Outcome: Progressing   Problem: Activity: Goal: Risk for activity intolerance will decrease Outcome: Progressing   Problem: Nutrition: Goal: Adequate nutrition will be maintained Outcome: Progressing   Problem: Coping: Goal: Level of anxiety will decrease Outcome: Progressing   Problem: Pain Managment: Goal: General experience of comfort will improve Outcome: Progressing   Problem: Safety: Goal: Ability to remain free from injury will improve Outcome: Progressing   Problem: Skin Integrity: Goal: Risk for impaired skin integrity will decrease Outcome: Progressing

## 2020-05-14 NOTE — Consult Note (Signed)
Reason for Consult: Acute cholecystitis  Brent [name] is an 60 y.o. male.  HPI: He presented to the emergency department yesterday with a 1 week history of right upper quadrant abdominal pain and associated nausea.  He was septic on presentation and has multiple severe medical comorbidities including ejection fraction of 15 to 20%.  As result, percutaneous cholecystostomy was recommended.  This was placed yesterday by interventional radiology.  This morning, the patient states that he is feeling significantly improved.  He still has abdominal discomfort, but is not nearly as severe as it had been.  He does continue to require vasopressor support of his blood pressure.  He denies any nausea or vomiting.  He has remained afebrile.  Past Medical History:  Diagnosis Date  . CHF (congestive heart failure) (HCC)   . Chronic pain   . COPD (chronic obstructive pulmonary disease) (HCC)   . Depression   . Hepatitis C   . Hyperlipidemia   . Hypertension   . Spinal stenosis   . Subarachnoid hemorrhage [facility])     Past Surgical History:  Procedure Laterality Date  . LEFT HEART CATH AND CORONARY ANGIOGRAPHY N/A 03/04/2019   Procedure: LEFT HEART CATH AND CORONARY ANGIOGRAPHY;  Surgeon: Lamar Blinks, MD;  Location: [facility] INVASIVE CV LAB;  Service: Cardiovascular;  Laterality: N/A;    History reviewed. No pertinent family history.  Social History:  reports that he has been smoking cigarettes. He has been smoking about 2.00 packs per day. He has never used smokeless tobacco. He reports previous alcohol use. He reports previous drug use.  Allergies: No Known Allergies  Medications: I have reviewed the patient's current medications.  Results for orders placed or performed during the hospital encounter of 05/13/20 (from the past 48 hour(s))  Brain natriuretic peptide     Status: Abnormal   Collection Time: 05/13/20 12:54 AM  Result Value Ref Range   B Natriuretic Peptide 219.0 (H) 0.0 - 100.0 pg/mL     Comment: Performed at [facility], [address] [place], [place] [address]  Ammonia     Status: None   Collection Time: 05/13/20 12:54 AM  Result Value Ref Range   Ammonia 22 9 - 35 umol/L    Comment: Performed at [facility], [address] [place], [place] [address]  Resp Panel by RT-PCR (Flu A&B, Covid) Nasopharyngeal Swab     Status: None   Collection Time: 05/13/20 12:54 AM   Specimen: Nasopharyngeal Swab; Nasopharyngeal(NP) swabs in vial transport medium  Result Value Ref Range   SARS Coronavirus 2 by RT PCR NEGATIVE NEGATIVE    Comment: (NOTE) SARS-CoV-2 target nucleic acids are NOT DETECTED.  The SARS-CoV-2 RNA is generally detectable in upper respiratory specimens during the acute phase of infection. The lowest concentration of SARS-CoV-2 viral copies this assay can detect is 138 copies/mL. A negative result does not preclude SARS-Cov-2 infection and should not be used as the sole basis for treatment or other patient management decisions. A negative result may occur with  improper specimen collection/handling, submission of specimen other than nasopharyngeal swab, presence of viral mutation(s) within the areas targeted by this assay, and inadequate number of viral copies(<138 copies/mL). A negative result must be combined with clinical observations, patient history, and epidemiological information. The expected result is Negative.  Fact Sheet for Patients:  BloggerCourse.com  Fact Sheet for Healthcare Providers:  SeriousBroker.it  This test is no t yet approved or cleared by the [place] FDA and  has  been authorized for detection and/or diagnosis of SARS-CoV-2 by FDA under an Emergency Use Authorization (EUA). This EUA will remain  in effect (meaning this test can be used) for the duration of the COVID-19 declaration under Section 564(b)(1) of the Act, 21 U.S.C.section 360bbb-3(b)(1),  unless the authorization is terminated  or revoked sooner.       Influenza A by PCR NEGATIVE NEGATIVE   Influenza B by PCR NEGATIVE NEGATIVE    Comment: (NOTE) The Xpert Xpress SARS-CoV-2/FLU/RSV plus assay is intended as an aid in the diagnosis of influenza from Nasopharyngeal swab specimens and should not be used as a sole basis for treatment. Nasal washings and aspirates are unacceptable for Xpert Xpress SARS-CoV-2/FLU/RSV testing.  Fact Sheet for Patients: BloggerCourse.com  Fact Sheet for Healthcare Providers: SeriousBroker.it  This test is not yet approved or cleared by the [place] FDA and has been authorized for detection and/or diagnosis of SARS-CoV-2 by FDA under an Emergency Use Authorization (EUA). This EUA will remain in effect (meaning this test can be used) for the duration of the COVID-19 declaration under Section 564(b)(1) of the Act, 21 U.S.C. section 360bbb-3(b)(1), unless the authorization is terminated or revoked.  Performed at [facility], [address] [place], [place] [address]   Procalcitonin - Baseline     Status: None   Collection Time: 05/13/20 12:54 AM  Result Value Ref Range   Procalcitonin 4.63 ng/mL    Comment:        Interpretation: PCT > 2 ng/mL: Systemic infection (sepsis) is likely, unless other causes are known. (NOTE)       Sepsis PCT Algorithm           Lower Respiratory Tract                                      Infection PCT Algorithm    ----------------------------     ----------------------------         PCT < 0.25 ng/mL                PCT < 0.10 ng/mL          Strongly encourage             Strongly discourage   discontinuation of antibiotics    initiation of antibiotics    ----------------------------     -----------------------------       PCT 0.25 - 0.50 ng/mL            PCT 0.10 - 0.25 ng/mL               OR       >80% decrease in PCT             Discourage initiation of                                            antibiotics      Encourage discontinuation           of antibiotics    ----------------------------     -----------------------------         PCT >= 0.50 ng/mL              PCT 0.26 - 0.50 ng/mL  AND       <80% decrease in PCT              Encourage initiation of                                             antibiotics       Encourage continuation           of antibiotics    ----------------------------     -----------------------------        PCT >= 0.50 ng/mL                  PCT > 0.50 ng/mL               AND         increase in PCT                  Strongly encourage                                      initiation of antibiotics    Strongly encourage escalation           of antibiotics                                     -----------------------------                                           PCT <= 0.25 ng/mL                                                 OR                                        > 80% decrease in PCT                                      Discontinue / Do not initiate                                             antibiotics  Performed at [facility], [address], [place], [place] [address]   Troponin I (High Sensitivity)     Status: None   Collection Time: 05/13/20 12:55 AM  Result Value Ref Range   Troponin I (High Sensitivity) 13 <18 ng/L    Comment: (NOTE) Elevated high sensitivity troponin I (hsTnI) values and significant  changes across serial measurements may suggest ACS but many other  chronic and acute conditions are known to elevate hsTnI results.  Refer to the "Links" section for chest pain algorithms and additional  guidance. Performed at [facility],  [address] [place], [place] [address]   Comprehensive metabolic panel     Status: Abnormal   Collection Time: 05/13/20 12:55 AM  Result Value Ref Range   Sodium 130 (L) 135 - 145 mmol/L    Potassium 3.6 3.5 - 5.1 mmol/L   Chloride 96 (L) 98 - 111 mmol/L   CO2 24 22 - 32 mmol/L   Glucose, Bld 152 (H) 70 - 99 mg/dL    Comment: Glucose reference range applies only to samples taken after fasting for at least 8 hours.   BUN 45 (H) 6 - 20 mg/dL   Creatinine, Ser 6.042.49 (H) 0.61 - 1.24 mg/dL   Calcium 7.9 (L) 8.9 - 10.3 mg/dL   Total Protein 6.1 (L) 6.5 - 8.1 g/dL   Albumin 2.7 (L) 3.5 - 5.0 g/dL   AST 64 (H) 15 - 41 U/L   ALT 99 (H) 0 - 44 U/L   Alkaline Phosphatase 82 38 - 126 U/L   Total Bilirubin 1.1 0.3 - 1.2 mg/dL   GFR, Estimated 29 (L) >60 mL/min    Comment: (NOTE) Calculated using the CKD-EPI Creatinine Equation (2021)    Anion gap 10 5 - 15    Comment: Performed at [facility], [address] [place], [place] [address]  CBC with Differential     Status: Abnormal   Collection Time: 05/13/20 12:55 AM  Result Value Ref Range   WBC 18.0 (H) 4.0 - 10.5 K/uL   RBC 3.82 (L) 4.22 - 5.81 MIL/uL   Hemoglobin 11.6 (L) 13.0 - 17.0 g/dL   HCT 11.933.2 (L) 14.739.0 - 82.952.0 %   MCV 86.9 80.0 - 100.0 fL   MCH 30.4 26.0 - 34.0 pg   MCHC 34.9 30.0 - 36.0 g/dL   RDW 56.213.2 13.011.5 - 86.515.5 %   Platelets 205 150 - 400 K/uL   nRBC 0.0 0.0 - 0.2 %   Neutrophils Relative % 81 %   Neutro Abs 14.5 (H) 1.7 - 7.7 K/uL   Lymphocytes Relative 6 %   Lymphs Abs 1.0 0.7 - 4.0 K/uL   Monocytes Relative 7 %   Monocytes Absolute 1.3 (H) 0.1 - 1.0 K/uL   Eosinophils Relative 0 %   Eosinophils Absolute 0.0 0.0 - 0.5 K/uL   Basophils Relative 0 %   Basophils Absolute 0.1 0.0 - 0.1 K/uL   WBC Morphology MORPHOLOGY UNREMARKABLE    RBC Morphology MORPHOLOGY UNREMARKABLE    Smear Review MORPHOLOGY UNREMARKABLE    Immature Granulocytes 6 %   Abs Immature Granulocytes 1.06 (H) 0.00 - 0.07 K/uL    Comment: Performed at [facility], [address] [place], [place] [address]  Magnesium     Status: None   Collection Time: 05/13/20 12:55 AM  Result Value Ref Range   Magnesium 2.2 1.7 -  2.4 mg/dL    Comment: Performed at [facility], [address] [place], [place] [address]  Troponin I (High Sensitivity)     Status: None   Collection Time: 05/13/20  3:33 AM  Result Value Ref Range   Troponin I (High Sensitivity) 10 <18 ng/L    Comment: (NOTE) Elevated high sensitivity troponin I (hsTnI) values and significant  changes across serial measurements may suggest ACS but many other  chronic and acute conditions are known to elevate hsTnI results.  Refer to the "Links" section for chest pain algorithms and additional  guidance. Performed at [facility], [address] [place], [place] [address]  Blood culture (routine x 2)     Status: None (Preliminary result)   Collection Time: 05/13/20  3:33 AM   Specimen: BLOOD  Result Value Ref Range   Specimen Description BLOOD BLOOD LEFT HAND    Special Requests      BOTTLES DRAWN AEROBIC AND ANAEROBIC Blood Culture adequate volume   Culture      NO GROWTH 1 DAY Performed at [facility], [address] [place], [place] [address]    Report Status PENDING   Blood culture (routine x 2)     Status: None (Preliminary result)   Collection Time: 05/13/20  3:33 AM   Specimen: BLOOD  Result Value Ref Range   Specimen Description BLOOD BLOOD RIGHT FOREARM    Special Requests      BOTTLES DRAWN AEROBIC AND ANAEROBIC Blood Culture adequate volume   Culture      NO GROWTH 1 DAY Performed at [facility], [address] [place], [place] [address]    Report Status PENDING   Lactic acid, plasma     Status: None   Collection Time: 05/13/20  3:33 AM  Result Value Ref Range   Lactic Acid, Venous 1.6 0.5 - 1.9 mmol/L    Comment: Performed at [facility], [address] [place], [place] [address]  HIV Antibody (routine testing w rflx)     Status: None   Collection Time: 05/13/20  3:33 AM  Result Value Ref Range   HIV Screen 4th Generation wRfx Non Reactive Non Reactive    Comment:  Performed at [facility], [address] [place], [place] [address]  Phosphorus     Status: None   Collection Time: 05/13/20  3:33 AM  Result Value Ref Range   Phosphorus 4.2 2.5 - 4.6 mg/dL    Comment: Performed at [facility], [address] [place], [place] [address]  Glucose, capillary     Status: None   Collection Time: 05/13/20  6:05 AM  Result Value Ref Range   Glucose-Capillary 88 70 - 99 mg/dL    Comment: Glucose reference range applies only to samples taken after fasting for at least 8 hours.  MRSA PCR Screening     Status: None   Collection Time: 05/13/20  6:07 AM   Specimen: Nasopharyngeal  Result Value Ref Range   MRSA by PCR NEGATIVE NEGATIVE    Comment:        The GeneXpert MRSA Assay (FDA approved for NASAL specimens only), is one component of a comprehensive MRSA colonization surveillance program. It is not intended to diagnose MRSA infection nor to guide or monitor treatment for MRSA infections. Performed at [facility], [address] [place], [place] [address]   Lipase, blood     Status: None   Collection Time: 05/13/20  6:25 AM  Result Value Ref Range   Lipase 18 11 - 51 U/L    Comment: Performed at [facility], [address] [place], [place] [address]  Protime-INR     Status: None   Collection Time: 05/13/20  6:25 AM  Result Value Ref Range   Prothrombin Time 14.7 11.4 - 15.2 seconds   INR 1.2 0.8 - 1.2    Comment: (NOTE) INR goal varies based on device and disease states. Performed at [facility], [address] [place], [place] [address]   Urinalysis, Complete w Microscopic Urine, Catheterized     Status: Abnormal   Collection Time: 05/13/20  4:00  PM  Result Value Ref Range   Color, Urine YELLOW (A) YELLOW   APPearance CLOUDY (A) CLEAR   Specific Gravity, Urine 1.008 1.005 - 1.030   pH 5.0 5.0 - 8.0   Glucose, UA >=500 (A) NEGATIVE mg/dL   Hgb urine dipstick MODERATE (A) NEGATIVE    Bilirubin Urine NEGATIVE NEGATIVE   Ketones, ur NEGATIVE NEGATIVE mg/dL   Protein, ur 30 (A) NEGATIVE mg/dL   Nitrite NEGATIVE NEGATIVE   Leukocytes,Ua NEGATIVE NEGATIVE   RBC / HPF 0-5 0 - 5 RBC/hpf   WBC, UA 0-5 0 - 5 WBC/hpf   Bacteria, UA RARE (A) NONE SEEN   Squamous Epithelial / LPF NONE SEEN 0 - 5   Mucus PRESENT     Comment: Performed at [facility], [address] [place], [place] [address]  Glucose, capillary     Status: Abnormal   Collection Time: 05/13/20  5:09 PM  Result Value Ref Range   Glucose-Capillary 119 (H) 70 - 99 mg/dL    Comment: Glucose reference range applies only to samples taken after fasting for at least 8 hours.  Glucose, capillary     Status: Abnormal   Collection Time: 05/13/20  7:27 PM  Result Value Ref Range   Glucose-Capillary 199 (H) 70 - 99 mg/dL    Comment: Glucose reference range applies only to samples taken after fasting for at least 8 hours.  Glucose, capillary     Status: Abnormal   Collection Time: 05/13/20 11:46 PM  Result Value Ref Range   Glucose-Capillary 193 (H) 70 - 99 mg/dL    Comment: Glucose reference range applies only to samples taken after fasting for at least 8 hours.  Glucose, capillary     Status: Abnormal   Collection Time: 05/14/20  3:55 AM  Result Value Ref Range   Glucose-Capillary 177 (H) 70 - 99 mg/dL    Comment: Glucose reference range applies only to samples taken after fasting for at least 8 hours.  Renal function panel     Status: Abnormal   Collection Time: 05/14/20  5:00 AM  Result Value Ref Range   Sodium 137 135 - 145 mmol/L   Potassium 3.8 3.5 - 5.1 mmol/L   Chloride 102 98 - 111 mmol/L   CO2 26 22 - 32 mmol/L   Glucose, Bld 156 (H) 70 - 99 mg/dL    Comment: Glucose reference range applies only to samples taken after fasting for at least 8 hours.   BUN 28 (H) 6 - 20 mg/dL   Creatinine, Ser 0.26 0.61 - 1.24 mg/dL   Calcium 8.3 (L) 8.9 - 10.3 mg/dL   Phosphorus 3.4 2.5 - 4.6 mg/dL   Albumin  2.5 (L) 3.5 - 5.0 g/dL   GFR, Estimated >37 >85 mL/min    Comment: (NOTE) Calculated using the CKD-EPI Creatinine Equation (2021)    Anion gap 9 5 - 15    Comment: Performed at [facility], [address] [place], [place] [address]  Magnesium     Status: Abnormal   Collection Time: 05/14/20  5:00 AM  Result Value Ref Range   Magnesium 2.9 (H) 1.7 - 2.4 mg/dL    Comment: Performed at [facility], [address] [place], [place] [address]  CBC with Differential/Platelet     Status: Abnormal   Collection Time: 05/14/20  5:00 AM  Result Value Ref Range   WBC 25.6 (H) 4.0 - 10.5 K/uL   RBC 4.51 4.22 - 5.81 MIL/uL  Hemoglobin 13.5 13.0 - 17.0 g/dL   HCT 09.8 11.9 - 14.7 %   MCV 87.1 80.0 - 100.0 fL   MCH 29.9 26.0 - 34.0 pg   MCHC 34.4 30.0 - 36.0 g/dL   RDW 82.9 56.2 - 13.0 %   Platelets 292 150 - 400 K/uL   nRBC 0.0 0.0 - 0.2 %   Neutrophils Relative % 90 %   Neutro Abs 22.6 (H) 1.7 - 7.7 K/uL   Lymphocytes Relative 3 %   Lymphs Abs 0.8 0.7 - 4.0 K/uL   Monocytes Relative 6 %   Monocytes Absolute 1.5 (H) 0.1 - 1.0 K/uL   Eosinophils Relative 0 %   Eosinophils Absolute 0.0 0.0 - 0.5 K/uL   Basophils Relative 0 %   Basophils Absolute 0.1 0.0 - 0.1 K/uL   WBC Morphology MORPHOLOGY UNREMARKABLE    RBC Morphology MORPHOLOGY UNREMARKABLE    Smear Review Normal platelet morphology    Immature Granulocytes 1 %   Abs Immature Granulocytes 0.31 (H) 0.00 - 0.07 K/uL    Comment: Performed at [facility], [address] [place], [place] [address]  Glucose, capillary     Status: Abnormal   Collection Time: 05/14/20  7:36 AM  Result Value Ref Range   Glucose-Capillary 181 (H) 70 - 99 mg/dL    Comment: Glucose reference range applies only to samples taken after fasting for at least 8 hours.  Glucose, capillary     Status: Abnormal   Collection Time: 05/14/20 11:41 AM  Result Value Ref Range   Glucose-Capillary 132 (H) 70 - 99 mg/dL    Comment:  Glucose reference range applies only to samples taken after fasting for at least 8 hours.    CT ABDOMEN PELVIS WO CONTRAST  Result Date: 05/13/2020 CLINICAL DATA:  Acute nonlocalized abdominal pain EXAM: CT ABDOMEN AND PELVIS WITHOUT CONTRAST TECHNIQUE: Multidetector CT imaging of the abdomen and pelvis was performed following the standard protocol without IV contrast. COMPARISON:  None. FINDINGS: Lower chest:  Dependent atelectasis.  Partially covered pacer leads. Hepatobiliary: No focal liver abnormality. Thick walled gallbladder with small calculi towards the fundus. Marked regional fat inflammation. Best seen on reformats there appears to be a fluid density outpouching extending inferiorly from the gallbladder measuring 14 mm. Pancreas: Generalized atrophy Spleen: Unremarkable. Adrenals/Urinary Tract: Negative adrenals. No hydronephrosis or stone. Unremarkable bladder. Stomach/Bowel: Low-density wall thickening involving the duodenum and hepatic flexure. The wall is most affected closest to the gallbladder rather than along the medial aspect, presumably secondary. No discrete colonic diverticulum is seen. Vascular/Lymphatic: Atheromatous calcification which is generalized. No mass or adenopathy. Reproductive:No pathologic findings. Other: No ascites or pneumoperitoneum. Musculoskeletal: No acute abnormalities. Advanced disc and facet degeneration of the lumbar spine with L2-3 anterolisthesis and generalized straightening elsewhere. IMPRESSION: 1. Marked right upper quadrant inflammation centered on the gallbladder and consistent with acute calculus cholecystitis. Fluid density outpouching extending inferiorly may reflect gangrenous inflammation with breakdown of the wall. Recommend right upper quadrant ultrasound. 2. Prominent inflammation at the hepatic flexure and mid duodenum which is presumed secondary. Electronically Signed   By: Marnee Spring M.D.   On: 05/13/2020 04:18   DG Chest 1 View  Result  Date: 05/13/2020 CLINICAL DATA:  Central line placement EXAM: CHEST  1 VIEW COMPARISON:  Portable exam 1020 hours compared to 05/13/2020 FINDINGS: RIGHT jugular central venous catheter with tip projecting over SVC. LEFT subclavian ICD leads project over RIGHT atrium and RIGHT ventricle. Normal heart size, mediastinal contours, and  pulmonary vascularity. Lungs clear. No pulmonary infiltrate, pleural effusion, or pneumothorax. Slightly decreased interstitial prominence since prior study. No acute osseous findings. Suspected chronic RIGHT rotator cuff tear. IMPRESSION: No pneumothorax following RIGHT jugular line placement. Electronically Signed   By: Ulyses Southward M.D.   On: 05/13/2020 10:56   DG Chest Portable 1 View  Result Date: 05/13/2020 CLINICAL DATA:  Shortness of breath EXAM: PORTABLE CHEST 1 VIEW COMPARISON:  03/02/2019 FINDINGS: Left AICD in place with leads in the right atrium and right ventricle. Heart is normal size. Peribronchial thickening and interstitial prominence. No effusions or acute bony abnormality. IMPRESSION: Peribronchial thickening and interstitial prominence, likely bronchitic changes. Electronically Signed   By: Charlett Nose M.D.   On: 05/13/2020 01:14   CT PERC CHOLECYSTOSTOMY  Result Date: 05/13/2020 INDICATION: Sepsis, acute cholecystitis and need for percutaneous cholecystostomy tube. The patient is not currently a candidate for cholecystectomy. EXAM: CT PERCUTANEOUS CHOLECYSTOSTOMY MEDICATIONS: See below. ANESTHESIA/SEDATION: Moderate (conscious) sedation was employed during this procedure. A total of Versed 2.0 mg and Fentanyl 1- mcg was administered intravenously. Moderate Sedation Time: 12 minutes. The patient's level of consciousness and vital signs were monitored continuously by radiology nursing throughout the procedure under my direct supervision. FLUOROSCOPY TIME:  CT guidance was utilized. COMPLICATIONS: None immediate. PROCEDURE: Informed written consent was obtained from  the patient after a thorough discussion of the procedural risks, benefits and alternatives. All questions were addressed. Maximal Sterile Barrier Technique was utilized including caps, mask, sterile gowns, sterile gloves, sterile drape, hand hygiene and skin antiseptic. A timeout was performed prior to the initiation of the procedure. Under CT guidance, an 18 gauge trocar needle was advanced into the gallbladder lumen. After return of bile, a guidewire was advanced, the tract dilated and a 10 French percutaneous drainage catheter placed. A sample of bile was sent for culture analysis. The drain was attached to a gravity drainage bag and secured at the skin with a Prolene retention suture and adhesive StatLock device. FINDINGS: There was return of dark colored bile with some mild debris from the gallbladder lumen. IMPRESSION: Percutaneous cholecystostomy tube placement with 10 French drainage catheter placed into the gallbladder lumen. The drain was attached to gravity bag drainage. A bile sample was sent for culture analysis. Electronically Signed   By: Irish Lack M.D.   On: 05/13/2020 15:32   US ABDOMEN LIMITED RUQ (LIVER/GB)  Result Date: 05/13/2020 CLINICAL DATA:  Acute abdominal pain with possible cholecystitis on recent CT scan. EXAM: ULTRASOUND ABDOMEN LIMITED RIGHT UPPER QUADRANT COMPARISON:  CT abdomen/pelvis 05/13/2020 FINDINGS: Gallbladder: Small mobile echogenic foci with posterior acoustic shadowing as well as mildly echogenic sludge layers within the gallbladder lumen. Per the sonographer, the sonographic Eulah Pont sign was positive. The gallbladder wall is diffusely thickened at 6 mm. Small amount of pericholecystic fluid visualized between the gallbladder wall and liver surface. Common bile duct: Diameter: Within normal limits at 4-6 mm Liver: Within normal limits in parenchymal echogenicity. Small circumscribed 1.2 x 1.4 x 1.6 cm echogenic subcapsular nodule in the right hepatic lobe. Portal  vein is patent on color Doppler imaging with normal direction of blood flow towards the liver. Other: None. IMPRESSION: 1. Sonographic findings are consistent with acute calculus cholecystitis in the appropriate clinical setting. 2. Circumscribed 1.6 cm echogenic subcapsular nodule in the right hepatic lobe. Imaging features are most suggestive of a benign hemangioma, however ultrasound is not definitively diagnostic. Consider repeat ultrasound in 6 months to ensure stability. Electronically Signed   By: Vilma Prader  Archer Asa M.D.   On: 05/13/2020 05:36    Review of Systems  All other systems reviewed and are negative.  Blood pressure 94/62, pulse 61, temperature (!) 97.4 F (36.3 C), temperature source Oral, resp. rate 13, height 6' (1.829 m), weight 91 kg, SpO2 97 %. Physical Exam Constitutional:      General: He is not in acute distress. HENT:     Head: Normocephalic and atraumatic.  Cardiovascular:     Rate and Rhythm: Normal rate and regular rhythm.  Pulmonary:     Effort: Pulmonary effort is normal. No respiratory distress.  Abdominal:     Palpations: Abdomen is soft.     Tenderness: There is abdominal tenderness. There is no guarding or rebound.     Comments: Percutaneous cholecystostomy drain in the right upper quadrant.  There is purulent bile in the bag.  No peritoneal signs on physical exam.  Genitourinary:    Comments: Deferred Musculoskeletal:        General: No deformity or signs of injury.  Skin:    General: Skin is warm and dry.     Coloration: Skin is not jaundiced.  Neurological:     General: No focal deficit present.     Mental Status: He is alert.  Psychiatric:        Mood and Affect: Mood normal.        Behavior: Behavior normal.     Assessment/Plan: This is a 60 year old man with multiple significant medical comorbidities who presented to the hospital with sepsis secondary to acute cholecystitis.  He has now had a percutaneous cholecystostomy tube placed and  seems to be improving.  He is still requiring vasopressors, however. --Continue supportive care, fluids, and antibiotics. --Following discharge, he should follow-up with Dr. Tonna Boehringer for reevaluation to determine whether or not he would be a candidate for future cholecystectomy. --General surgery will continue to follow.  Duanne Guess 05/14/2020, 1:57 PM

## 2020-05-14 NOTE — Progress Notes (Addendum)
NAME:  Brent [name], MRN:  [id number], DOB:  1960-09-22, LOS: 1 ADMISSION DATE:  05/13/2020, CONSULTATION DATE: 05/13/20 REFERRING MD: Dr. Don Perking, CHIEF COMPLAINT:  Abdominal pain and shortness of breath  Brief History of Present Illness:  This is a 60 y.o male significant PMH as below who presented to ER on 04/8 with chief complaints of shortness of breath, dizziness, right-sided and epigastric abdominal pain, nausea and vomiting x3 days.  Patient was subsequently admitted with severe sepsis with septic shock secondary to acute cholecystitis requiring vasopressors.  Due to high risk of perioperative complications, patient underwent IR guided percutaneous cholecystostomy tube placement 05/14/2020.  Pertinent  Medical History  Subarachnoid Hemorrhage Spinal Stenosis HTN HLD Hepatitis C Depression COPD Chronic Pain  Chronic Systolic CHF~EF 25 to 30% via Echo 03/01/2019 Ventricular Tachycardia requiring ICD   Significant Hospital Events: Including procedures, antibiotic start and stop dates in addition to other pertinent events   4/08: Patient admitted to ICU with hypovolemia and septic shock requiring Levophed gtt 4/08: s/p right IJ CVC placement 4/08: s/p IR guided percutaneous cholecystotomy tube placement  Cultures:  4/08: BCx: No growth to date   Antimicrobials:  04/08 Cefepime >> x 0 (DCd, changed to Zosyn) 04/08 Vancomycin >> x 1 04/08 Zosyn >>   Consults  General Surgery IR  Interim History / Subjective:  Hemodynamically stable, remains on Levophed  Objective   Blood pressure 110/72, pulse 68, temperature 97.8 F (36.6 C), resp. rate 16, height 6' (1.829 m), weight 91 kg, SpO2 97 %. CVP:  [6 mmHg-12 mmHg] 9 mmHg      Intake/Output Summary (Last 24 hours) at 05/14/2020 0806 Last data filed at 05/14/2020 0600 Gross per 24 hour  Intake 2272.09 ml  Output 5725 ml  Net -3452.91 ml   Filed Weights   05/13/20 0053 05/14/20 0500  Weight: 90.7 kg 91 kg    Physical  Examination: GENERAL:year-old patient lying in the bed with no acute distress.  EYES: Pupils equal, round, reactive to light and accommodation. No scleral icterus. Extraocular muscles intact.  HEENT: Head atraumatic, normocephalic. Oropharynx and nasopharynx clear.  NECK:  Supple, no jugular venous distention. No thyroid enlargement, no tenderness.  LUNGS: Normal breath sounds bilaterally, no wheezing, rales,rhonchi or crepitation. No use of accessory muscles of respiration.  CARDIOVASCULAR: S1, S2 normal. No murmurs, rubs, or gallops.  ABDOMEN: Soft, nontender, nondistended. Bowel sounds present. No organomegaly or mass.  EXTREMITIES: No pedal edema, cyanosis, or clubbing.  NEUROLOGIC: Cranial nerves II through XII are intact. Except speech is mildly garbled but comprehensible. Muscle strength 5/5 in all extremities. Sensation intact. Gait not checked.  PSYCHIATRIC: The patient is alert and oriented x 3.  SKIN: No obvious rash, lesion, or ulcer.   Labs/imaging that I havepersonally reviewed  (right click and "Reselect all SmartList Selections" daily)   (right click and "Reselect all SmartList Selections" daily)  04/8: BNP 219, pct 4.63, Na+ 130, chloride 96, glucose 152, BUN 45, creatinine 2.49, calcium 7.9, albumin 2.7, AST 64, ALT 99, wbc 18.0, and hgb 11.6 04/8: Respiratory Panel by RT-PCR negative 04/8: CXR concerning for bronchitis changes 04/08: CT Abdomen/Pelvis arked right upper quadrant inflammation centered on the gallbladder and consistent with acute calculus cholecystitis. Fluid density outpouching extending inferiorly may reflect gangrenous inflammation with breakdown of the wall.    Labs   CBC: Recent Labs  Lab 05/13/20 0055 05/14/20 0500  WBC 18.0* 25.6*  NEUTROABS 14.5* 22.6*  HGB 11.6* 13.5  HCT 33.2* 39.3  MCV 86.9  87.1  PLT 205 292    Basic Metabolic Panel: Recent Labs  Lab 05/13/20 0055 05/13/20 0333 05/14/20 0500  NA 130*  --  137  K 3.6  --  3.8   CL 96*  --  102  CO2 24  --  26  GLUCOSE 152*  --  156*  BUN 45*  --  28*  CREATININE 2.49*  --  1.06  CALCIUM 7.9*  --  8.3*  MG 2.2  --  2.9*  PHOS  --  4.2 3.4   GFR: Estimated Creatinine Clearance: 83.4 mL/min (by C-G formula based on SCr of 1.06 mg/dL). Recent Labs  Lab 05/13/20 0054 05/13/20 0055 05/13/20 0333 05/14/20 0500  PROCALCITON 4.63  --   --   --   WBC  --  18.0*  --  25.6*  LATICACIDVEN  --   --  1.6  --     Liver Function Tests: Recent Labs  Lab 05/13/20 0055 05/14/20 0500  AST 64*  --   ALT 99*  --   ALKPHOS 82  --   BILITOT 1.1  --   PROT 6.1*  --   ALBUMIN 2.7* 2.5*   Recent Labs  Lab 05/13/20 0625  LIPASE 18   Recent Labs  Lab 05/13/20 0054  AMMONIA 22    ABG    Component Value Date/Time   PHART 7.34 (L) 02/28/2019 1219   PCO2ART 47 02/28/2019 1219   PO2ART <31.0 (LL) 02/28/2019 1219   HCO3 25.4 02/28/2019 1219   ACIDBASEDEF 0.8 02/28/2019 1219   O2SAT 52.7 02/28/2019 1219     Coagulation Profile: Recent Labs  Lab 05/13/20 0625  INR 1.2    Cardiac Enzymes: No results for input(s): CKTOTAL, CKMB, CKMBINDEX, TROPONINI in the last 168 hours.  HbA1C: Hgb A1c MFr Bld  Date/Time Value Ref Range Status  02/28/2019 04:16 PM 5.9 (H) 4.8 - 5.6 % Final    Comment:    (NOTE) Pre diabetes:          5.7%-6.4% Diabetes:              >6.4% Glycemic control for   <7.0% adults with diabetes     CBG: Recent Labs  Lab 05/13/20 1709 05/13/20 1927 05/13/20 2346 05/14/20 0355 05/14/20 0736  GLUCAP 119* 199* 193* 177* 181*    Allergies No Known Allergies   Home Medications  Prior to Admission medications   Medication Sig Start Date End Date Taking? Authorizing Provider  albuterol (VENTOLIN HFA) 108 (90 Base) MCG/ACT inhaler Inhale 2 puffs into the lungs every 6 (six) hours as needed. 06/05/19 06/04/20 Yes [provider]  amiodarone (PACERONE) 400 MG tablet Take 1 tablet (400 mg total) by mouth daily. 08/17/19  Yes  Clarisa Kindred A, FNP  aspirin EC 81 MG EC tablet Take 1 tablet (81 mg total) by mouth daily. 03/06/19  Yes Danford, Earl Lites, MD  atorvastatin (LIPITOR) 80 MG tablet Take 1 tablet (80 mg total) by mouth daily at 6 PM. 08/17/19  Yes Hackney, Jarold Song, FNP  budesonide-formoterol (SYMBICORT) 160-4.5 MCG/ACT inhaler Inhale 2 puffs into the lungs 2 (two) times daily. 06/05/19 06/04/20 Yes [provider]  buPROPion (WELLBUTRIN XL) 150 MG 24 hr tablet Take 150 mg by mouth daily.   Yes [provider]  carvedilol (COREG) 3.125 MG tablet Take 1 tablet (3.125 mg total) by mouth 2 (two) times daily with a meal. 12/21/19  Yes Clarisa Kindred A, FNP  cyclobenzaprine (FLEXERIL) 10 MG  tablet Take 5-10 mg by mouth 2 (two) times daily.   Yes [provider]  diclofenac Sodium (VOLTAREN) 1 % GEL Apply 2 g topically 4 (four) times daily as needed. 04/06/20  Yes [provider]  empagliflozin (JARDIANCE) 10 MG TABS tablet Take 1 tablet (10 mg total) by mouth daily before breakfast. 12/21/19  Yes Clarisa Kindred A, FNP  gabapentin (NEURONTIN) 300 MG capsule Take 300 mg by mouth 2 (two) times daily.   Yes [provider]  isosorbide mononitrate (IMDUR) 30 MG 24 hr tablet Take 30 mg by mouth daily.   Yes [provider]  naproxen sodium (ALEVE) 220 MG tablet Take 220 mg by mouth daily as needed.   Yes [provider]  nicotine (NICODERM CQ - DOSED IN MG/24 HOURS) 21 mg/24hr patch Place 1 patch (21 mg total) onto the skin daily. 04/06/20  Yes Hackney, Inetta Fermo A, FNP  oxyCODONE (ROXICODONE) 15 MG immediate release tablet Take 1 tablet (15 mg total) by mouth every 4 (four) hours as needed for moderate pain. 03/05/19  Yes Danford, Earl Lites, MD  sacubitril-valsartan (ENTRESTO) 97-103 MG Take 1 tablet by mouth 2 (two) times daily. 12/21/19  Yes Clarisa Kindred A, FNP  spironolactone (ALDACTONE) 25 MG tablet Take 1 tablet (25 mg total) by mouth daily. 08/17/19  Yes Hackney, Inetta Fermo  A, FNP  nitroGLYCERIN (NITROSTAT) 0.4 MG SL tablet Place 1 tablet (0.4 mg total) under the tongue every 5 (five) minutes x 3 doses as needed for chest pain. 03/05/19   Alberteen Sam, MD     Resolved Hospital Problem list   Hyponatremia   Assessment & Plan:  60 y.o male admitted with septic and hypovolemic shock secondary to acute cholecystitis requiring vasopressors s/p  IR guided percutaneous cholecystostomy tube placement 05/14/2020.   Sepsis with septic and hypovolemic shock secondary to Acute Cholecystitis S/P IR guided percutaneous cholecystostomy tube placement 05/14/2020.  - Trend PCT, monitor WBC/ fever curve - Daily CBC - IV antibiotics: Continue Zosyn  - IVF hydration as needed: - Continue Levo vasopressors to maintain MAP< 65 , weaning as appropriate - Strict I/O's: Goal UOP > 0.5 mL/kg/hr - Persistent hypotension consider stress dose steroids (hydrocortisone 50q6 or 100q8) - General surgery following, input appreciated, plan for  re-eval in 6-8 weeks after medial optimization if possible prior to considering elective cholecystectomy.  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  Last Echo ? EF 25-30% -Continuous cardiac monitoring -Maintain MAP greater than 65 -Hold Lasix in the setting of hypotension -Hold Entresto, Coreg, Imdur for now -Atorvastatin 80mg  PO qhs -Aspirin 80 mg daily -Low salt diet   Acute Kidney Failure- Likely prerenal in the setting of hypotension Cr improved with hydration  -Monitor I&O's / urinary output -Follow BMP -Ensure adequate renal perfusion -Avoid nephrotoxic  agents as able -Replace electrolytes as indicated     Undiagnosed Diabetes mellitus? -Check HGBa1C -CBGs -Sliding scale insulin -Follow ICU hyper/hypoglycemia protocol -Hold home Jadiance     Best practice (right click and "Reselect all SmartList Selections" daily)  Diet:  Oral Pain/Anxiety/Delirium protocol (if indicated): No VAP protocol (if indicated): Not indicated DVT prophylaxis: Subcutaneous Heparin GI prophylaxis: H2B Glucose control:  SSI Yes Central venous access:  Yes, and it is still needed Arterial line:  N/A Foley:  N/A Mobility:  bed rest  PT consulted: N/A Last date of multidisciplinary goals of care discussion [4/8] Code Status:  full code Disposition: ICU   Critical care time: 2        Webb Silversmith, DNP, CCRN, FNP-C, AGACNP-BC Acute Care Nurse Practitioner  [facility] Pager: [phone] [place] [facility]

## 2020-05-15 DIAGNOSIS — N179 Acute kidney failure, unspecified: Secondary | ICD-10-CM | POA: Diagnosis not present

## 2020-05-15 DIAGNOSIS — R6521 Severe sepsis with septic shock: Secondary | ICD-10-CM | POA: Diagnosis not present

## 2020-05-15 DIAGNOSIS — A419 Sepsis, unspecified organism: Secondary | ICD-10-CM | POA: Diagnosis not present

## 2020-05-15 DIAGNOSIS — K810 Acute cholecystitis: Secondary | ICD-10-CM | POA: Diagnosis not present

## 2020-05-15 LAB — GLUCOSE, CAPILLARY
Glucose-Capillary: 134 mg/dL — ABNORMAL HIGH (ref 70–99)
Glucose-Capillary: 152 mg/dL — ABNORMAL HIGH (ref 70–99)
Glucose-Capillary: 228 mg/dL — ABNORMAL HIGH (ref 70–99)
Glucose-Capillary: 120 mg/dL — ABNORMAL HIGH (ref 70–99)
Glucose-Capillary: 106 mg/dL — ABNORMAL HIGH (ref 70–99)
Glucose-Capillary: 117 mg/dL — ABNORMAL HIGH (ref 70–99)

## 2020-05-15 LAB — BASIC METABOLIC PANEL
Anion gap: 10 (ref 5–15)
BUN: 37 mg/dL — ABNORMAL HIGH (ref 6–20)
CO2: 25 mmol/L (ref 22–32)
Calcium: 7.8 mg/dL — ABNORMAL LOW (ref 8.9–10.3)
Chloride: 102 mmol/L (ref 98–111)
Creatinine, Ser: 1.09 mg/dL (ref 0.61–1.24)
GFR, Estimated: >60 mL/min (ref >60)
Glucose, Bld: 169 mg/dL — ABNORMAL HIGH (ref 70–99)
Potassium: 3.7 mmol/L (ref 3.5–5.1)
Sodium: 137 mmol/L (ref 135–145)

## 2020-05-15 LAB — CBC
HCT: 38.1 % — ABNORMAL LOW (ref 39.0–52.0)
Hemoglobin: 13.3 g/dL (ref 13.0–17.0)
MCH: 30 pg (ref 26.0–34.0)
MCHC: 34.9 g/dL (ref 30.0–36.0)
MCV: 85.8 fL (ref 80.0–100.0)
Platelets: 354 10*3/uL (ref 150–400)
RBC: 4.44 MIL/uL (ref 4.22–5.81)
RDW: 13.3 % (ref 11.5–15.5)
WBC: 24.4 10*3/uL — ABNORMAL HIGH (ref 4.0–10.5)
nRBC: 0 % (ref 0.0–0.2)

## 2020-05-15 LAB — MAGNESIUM: Magnesium: 2.6 mg/dL — ABNORMAL HIGH (ref 1.7–2.4)

## 2020-05-15 LAB — PHOSPHORUS: Phosphorus: 3.9 mg/dL (ref 2.5–4.6)

## 2020-05-15 LAB — CORTISOL: Cortisol, Plasma: 53 ug/dL

## 2020-05-15 LAB — ALBUMIN: Albumin: 2.2 g/dL — ABNORMAL LOW (ref 3.5–5.0)

## 2020-05-15 MED ORDER — GABAPENTIN 300 MG PO CAPS
300.0000 mg | ORAL_CAPSULE | Freq: Two times a day (BID) | ORAL | Status: DC
  Administered 2020-05-15 – 2020-05-18 (×7): 300 mg via ORAL
  Filled 2020-05-15 (×7): qty 1

## 2020-05-15 MED ORDER — POTASSIUM CHLORIDE 20 MEQ PO PACK
20.0000 meq | PACK | Freq: Once | ORAL | Status: AC
  Administered 2020-05-15: 20 meq via ORAL
  Filled 2020-05-15: qty 1

## 2020-05-15 MED ORDER — ATORVASTATIN CALCIUM 20 MG PO TABS
80.0000 mg | ORAL_TABLET | Freq: Every day | ORAL | Status: DC
  Administered 2020-05-15 – 2020-05-17 (×3): 80 mg via ORAL
  Filled 2020-05-15 (×3): qty 4

## 2020-05-15 MED ORDER — BUPROPION HCL ER (XL) 150 MG PO TB24
150.0000 mg | ORAL_TABLET | Freq: Every day | ORAL | Status: DC
  Administered 2020-05-15 – 2020-05-18 (×4): 150 mg via ORAL
  Filled 2020-05-15 (×5): qty 1

## 2020-05-15 MED ORDER — ALBUMIN HUMAN 25 % IV SOLN
12.5000 g | Freq: Once | INTRAVENOUS | Status: AC
  Administered 2020-05-15: 12.5 g via INTRAVENOUS
  Filled 2020-05-15: qty 50

## 2020-05-15 MED ORDER — MIDODRINE HCL 5 MG PO TABS
10.0000 mg | ORAL_TABLET | Freq: Three times a day (TID) | ORAL | Status: DC
  Administered 2020-05-15 – 2020-05-17 (×7): 10 mg via ORAL
  Filled 2020-05-15 (×7): qty 2

## 2020-05-15 NOTE — Progress Notes (Addendum)
NAME:  Brent [name], MRN:  [id number], DOB:  Sep 13, 1960, LOS: 2 ADMISSION DATE:  05/13/2020, CONSULTATION DATE: 05/13/20 REFERRING MD: Dr. Don Perking, CHIEF COMPLAINT:  Abdominal pain and shortness of breath  Brief History of Present Illness:  This is a 60 y.o male significant PMH as below who presented to ER on 04/8 with chief complaints of shortness of breath, dizziness, right-sided and epigastric abdominal pain, nausea and vomiting x3 days.  Patient was subsequently admitted with severe sepsis with septic shock secondary to acute cholecystitis requiring vasopressors.  Due to high risk of perioperative complications, patient underwent IR guided percutaneous cholecystostomy tube placement 05/14/2020.  Pertinent  Medical History  Subarachnoid Hemorrhage Spinal Stenosis HTN HLD Hepatitis C Depression COPD Chronic Pain  Chronic Systolic CHF~EF 25 to 30% via Echo 03/01/2019 Ventricular Tachycardia requiring ICD   Significant Hospital Events: Including procedures, antibiotic start and stop dates in addition to other pertinent events   4/08: Patient admitted to ICU with hypovolemia and septic shock requiring Levophed gtt 4/08: s/p right IJ CVC placement 4/08: s/p IR guided percutaneous cholecystotomy tube placement  Cultures:  4/08: BCx: No growth to date   Antimicrobials:  04/08 Cefepime >> x 0 (DCd, changed to Zosyn) 04/08 Vancomycin >> x 1 04/08 Zosyn >>   Consults  General Surgery IR  Interim History / Subjective:  Hemodynamically stable, remains on Levophed  Objective   Blood pressure (!) 94/56, pulse (!) 59, temperature 97.9 F (36.6 C), temperature source Axillary, resp. rate 15, height 6' (1.829 m), weight 91 kg, SpO2 98 %. CVP:  [0 mmHg-9 mmHg] 9 mmHg      Intake/Output Summary (Last 24 hours) at 05/15/2020 1046 Last data filed at 05/15/2020 0513 Gross per 24 hour  Intake 78.45 ml  Output 1470 ml  Net -1391.55 ml   Filed Weights   05/13/20 0053 05/14/20 0500 05/15/20  0500  Weight: 90.7 kg 91 kg 91 kg    Physical Examination: GENERAL:year-old patient lying in the bed with no acute distress.  EYES: Pupils equal, round, reactive to light and accommodation. No scleral icterus. Extraocular muscles intact.  HEENT: Head atraumatic, normocephalic. Oropharynx and nasopharynx clear.  NECK:  Supple, no jugular venous distention. No thyroid enlargement, no tenderness.  LUNGS: Normal breath sounds bilaterally, no wheezing, rales,rhonchi or crepitation. No use of accessory muscles of respiration.  CARDIOVASCULAR: S1, S2 normal. No murmurs, rubs, or gallops.  ABDOMEN: Soft, nontender, nondistended. Bowel sounds present. No organomegaly or mass. Percutaneous cholecystostomy drain in the right upper quadrant.  There is small amount of purulent bile in the bag.   EXTREMITIES: No pedal edema, cyanosis, or clubbing.  NEUROLOGIC: Cranial nerves II through XII are intact. Except speech is mildly garbled but comprehensible. Muscle strength 5/5 in all extremities. Sensation intact. Gait not checked.  PSYCHIATRIC: The patient is alert and oriented x 3.  SKIN: No obvious rash, lesion, or ulcer.   Labs/imaging that I havepersonally reviewed  (right click and "Reselect all SmartList Selections" daily)   (right click and "Reselect all SmartList Selections" daily)  04/8: BNP 219, pct 4.63, Na+ 130, chloride 96, glucose 152, BUN 45, creatinine 2.49, calcium 7.9, albumin 2.7, AST 64, ALT 99, wbc 18.0, and hgb 11.6 04/8: Respiratory Panel by RT-PCR negative 04/8: CXR concerning for bronchitis changes 04/08: CT Abdomen/Pelvis arked right upper quadrant inflammation centered on the gallbladder and consistent with acute calculus cholecystitis. Fluid density outpouching extending inferiorly may reflect gangrenous inflammation with breakdown of the wall.    Labs  CBC: Recent Labs  Lab 05/13/20 0055 05/14/20 0500 05/15/20 0406  WBC 18.0* 25.6* 24.4*  NEUTROABS 14.5* 22.6*  --    HGB 11.6* 13.5 13.3  HCT 33.2* 39.3 38.1*  MCV 86.9 87.1 85.8  PLT 205 292 354    Basic Metabolic Panel: Recent Labs  Lab 05/13/20 0055 05/13/20 0333 05/14/20 0500 05/15/20 0406  NA 130*  --  137 137  K 3.6  --  3.8 3.7  CL 96*  --  102 102  CO2 24  --  26 25  GLUCOSE 152*  --  156* 169*  BUN 45*  --  28* 37*  CREATININE 2.49*  --  1.06 1.09  CALCIUM 7.9*  --  8.3* 7.8*  MG 2.2  --  2.9* 2.6*  PHOS  --  4.2 3.4 3.9   GFR: Estimated Creatinine Clearance: 81.1 mL/min (by C-G formula based on SCr of 1.09 mg/dL). Recent Labs  Lab 05/13/20 0054 05/13/20 0055 05/13/20 0333 05/14/20 0500 05/15/20 0406  PROCALCITON 4.63  --   --   --   --   WBC  --  18.0*  --  25.6* 24.4*  LATICACIDVEN  --   --  1.6  --   --     Liver Function Tests: Recent Labs  Lab 05/13/20 0055 05/14/20 0500 05/15/20 0406  AST 64*  --   --   ALT 99*  --   --   ALKPHOS 82  --   --   BILITOT 1.1  --   --   PROT 6.1*  --   --   ALBUMIN 2.7* 2.5* 2.2*   Recent Labs  Lab 05/13/20 0625  LIPASE 18   Recent Labs  Lab 05/13/20 0054  AMMONIA 22    ABG    Component Value Date/Time   PHART 7.34 (L) 02/28/2019 1219   PCO2ART 47 02/28/2019 1219   PO2ART <31.0 (LL) 02/28/2019 1219   HCO3 25.4 02/28/2019 1219   ACIDBASEDEF 0.8 02/28/2019 1219   O2SAT 52.7 02/28/2019 1219     Coagulation Profile: Recent Labs  Lab 05/13/20 0625  INR 1.2    Cardiac Enzymes: No results for input(s): CKTOTAL, CKMB, CKMBINDEX, TROPONINI in the last 168 hours.  HbA1C: Hgb A1c MFr Bld  Date/Time Value Ref Range Status  05/14/2020 05:00 AM 6.1 (H) 4.8 - 5.6 % Final    Comment:    (NOTE) Pre diabetes:          5.7%-6.4%  Diabetes:              >6.4%  Glycemic control for   <7.0% adults with diabetes   02/28/2019 04:16 PM 5.9 (H) 4.8 - 5.6 % Final    Comment:    (NOTE) Pre diabetes:          5.7%-6.4% Diabetes:              >6.4% Glycemic control for   <7.0% adults with diabetes      CBG: Recent Labs  Lab 05/14/20 1536 05/14/20 1940 05/14/20 2352 05/15/20 0325 05/15/20 0743  GLUCAP 168* 128* 181* 134* 152*    Allergies No Known Allergies   Home Medications  Prior to Admission medications   Medication Sig Start Date End Date Taking? Authorizing Provider  albuterol (VENTOLIN HFA) 108 (90 Base) MCG/ACT inhaler Inhale 2 puffs into the lungs every 6 (six) hours as needed. 06/05/19 06/04/20 Yes [provider]  amiodarone (PACERONE) 400 MG tablet Take 1 tablet (400 mg  total) by mouth daily. 08/17/19  Yes Clarisa Kindred A, FNP  aspirin EC 81 MG EC tablet Take 1 tablet (81 mg total) by mouth daily. 03/06/19  Yes Danford, Earl Lites, MD  atorvastatin (LIPITOR) 80 MG tablet Take 1 tablet (80 mg total) by mouth daily at 6 PM. 08/17/19  Yes Hackney, Jarold Song, FNP  budesonide-formoterol (SYMBICORT) 160-4.5 MCG/ACT inhaler Inhale 2 puffs into the lungs 2 (two) times daily. 06/05/19 06/04/20 Yes [provider]  buPROPion (WELLBUTRIN XL) 150 MG 24 hr tablet Take 150 mg by mouth daily.   Yes [provider]  carvedilol (COREG) 3.125 MG tablet Take 1 tablet (3.125 mg total) by mouth 2 (two) times daily with a meal. 12/21/19  Yes Hackney, Tina A, FNP  cyclobenzaprine (FLEXERIL) 10 MG tablet Take 5-10 mg by mouth 2 (two) times daily.   Yes [provider]  diclofenac Sodium (VOLTAREN) 1 % GEL Apply 2 g topically 4 (four) times daily as needed. 04/06/20  Yes [provider]  empagliflozin (JARDIANCE) 10 MG TABS tablet Take 1 tablet (10 mg total) by mouth daily before breakfast. 12/21/19  Yes Clarisa Kindred A, FNP  gabapentin (NEURONTIN) 300 MG capsule Take 300 mg by mouth 2 (two) times daily.   Yes [provider]  isosorbide mononitrate (IMDUR) 30 MG 24 hr tablet Take 30 mg by mouth daily.   Yes [provider]  naproxen sodium (ALEVE) 220 MG tablet Take 220 mg by mouth daily as needed.   Yes [provider]  nicotine  (NICODERM CQ - DOSED IN MG/24 HOURS) 21 mg/24hr patch Place 1 patch (21 mg total) onto the skin daily. 04/06/20  Yes Hackney, Inetta Fermo A, FNP  oxyCODONE (ROXICODONE) 15 MG immediate release tablet Take 1 tablet (15 mg total) by mouth every 4 (four) hours as needed for moderate pain. 03/05/19  Yes Danford, Earl Lites, MD  sacubitril-valsartan (ENTRESTO) 97-103 MG Take 1 tablet by mouth 2 (two) times daily. 12/21/19  Yes Clarisa Kindred A, FNP  spironolactone (ALDACTONE) 25 MG tablet Take 1 tablet (25 mg total) by mouth daily. 08/17/19  Yes Hackney, Inetta Fermo A, FNP  nitroGLYCERIN (NITROSTAT) 0.4 MG SL tablet Place 1 tablet (0.4 mg total) under the tongue every 5 (five) minutes x 3 doses as needed for chest pain. 03/05/19   Danford, Earl Lites, MD    Scheduled Meds: . atorvastatin  80 mg Oral q1800  . buPROPion  150 mg Oral Daily  . Chlorhexidine Gluconate Cloth  6 each Topical Q0600  . gabapentin  300 mg Oral BID  . heparin  5,000 Units Subcutaneous Q8H  . hydrocortisone sod succinate (SOLU-CORTEF) inj  50 mg Intravenous Q6H  . insulin aspart  0-15 Units Subcutaneous Q4H  . midodrine  10 mg Oral TID WC  . mometasone-formoterol  2 puff Inhalation BID  . nicotine  21 mg Transdermal Daily  . oxyCODONE  15 mg Oral Q4H while awake  . potassium chloride  20 mEq Oral Once  . sodium chloride flush  5 mL Intracatheter Q8H   Continuous Infusions: . sodium chloride Stopped (05/13/20 0354)  . famotidine (PEPCID) IV 20 mg (05/15/20 0351)  . norepinephrine (LEVOPHED) Adult infusion 6 mcg/min (05/14/20 1948)  . piperacillin-tazobactam (ZOSYN)  IV 3.375 g (05/15/20 0513)   PRN Meds:.acetaminophen, albuterol, docusate sodium, morphine injection, ondansetron (ZOFRAN) IV, polyethylene glycol  Resolved Hospital Problem list   Hyponatremia   Assessment & Plan:  60 y.o male admitted with septic and hypovolemic shock secondary  to acute cholecystitis requiring vasopressors s/p  IR guided percutaneous cholecystostomy  tube placement 05/14/2020.   Sepsis with septic and hypovolemic shock secondary to Acute Cholecystitis S/P IR guided percutaneous cholecystostomy tube placement 05/14/2020. Improved but still requiring vasopressor to maintain MAP>60  - Trend PCT, monitor WBC/ fever curve - Daily CBC - IV antibiotics: Continue Zosyn  - IVF hydration as needed: - Continue Levo vasopressors to maintain MAP< 65 , weaning as appropriate, will add Midodrine in an attempt to wean off Levo. - Albumin x 1 dose - Strict I/O's: Goal UOP > 0.5 mL/kg/hr - Continue stress dose steroids  - General surgery following, input appreciated, plan for  re-eval in 6-8 weeks after medial optimization if possible prior to considering elective cholecystectomy.  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  Last Echo ? EF 25-30% - Continuous cardiac monitoring - Maintain MAP greater than 65 - Hold Lasix in the setting of hypotension - Hold Entresto, Coreg, Imdur for now - Atorvastatin 80mg  PO qhs - Aspirin 80 mg daily - Low salt diet   Acute Kidney Failure- Likely prerenal in the setting of hypotension Cr improved with hydration  - Monitor I&O's / urinary output - Follow BMP - Ensure adequate renal perfusion - Avoid nephrotoxic agents as able - Replace electrolytes as indicated     Undiagnosed Diabetes mellitus? - Hemoglobin A1c 6.1 up from 5.9 - CBGs - Sliding scale insulin - Follow ICU hyper/hypoglycemia protocol - Hold home Jadiance     Best practice (right click and "Reselect all SmartList Selections" daily)  Diet:  Oral Pain/Anxiety/Delirium  protocol (if indicated): No VAP protocol (if indicated): Not indicated DVT prophylaxis: Subcutaneous Heparin GI prophylaxis: H2B Glucose control:  SSI Yes Central venous access:  Yes, and it is still needed Arterial line:  N/A Foley:  N/A Mobility:  bed rest  PT consulted: N/A Last date of multidisciplinary goals of care discussion [4/8] Code Status:  full code Disposition: ICU   Critical care time: 51        Webb Silversmith, DNP, CCRN, FNP-C, AGACNP-BC Acute Care Nurse Practitioner  [facility] Pager: [phone]  at [facility]

## 2020-05-15 NOTE — Consult Note (Signed)
PHARMACY CONSULT NOTE - FOLLOW UP  Pharmacy Consult for Electrolyte Monitoring and Replacement   Recent Labs: Potassium (mmol/L)  Date Value  05/15/2020 3.7   Magnesium (mg/dL)  Date Value  73/71/0626 2.6 (H)   Calcium (mg/dL)  Date Value  94/85/4627 7.8 (L)   Albumin (g/dL)  Date Value  03/50/0938 2.5 (L)   Phosphorus (mg/dL)  Date Value  18/29/9371 3.9   Sodium (mmol/L)  Date Value  05/15/2020 137   Corrected Ca: 8.9  Assessment: 60 yo M presented with shortness of breath, dizziness, and emesis x 3 days. PMH includes CHF with EF of 25% s/p defibrillator, cirrhosis, COPD, HTN, and HLD. Pharmacy consulted to monitor and replace electrolytes.  IV fluids d/c'ed.   Goal of Therapy:  K 4.0-5.1 Mg 2.0-2.4 Electrolytes WNL  Plan:  Will give KCl 20 mEq x 1.  --Monitor electrolytes with AM labs  Ronnald Ramp, PharmD 05/15/2020 7:28 AM

## 2020-05-15 NOTE — Consult Note (Signed)
Reason for Consult: Acute cholecystitis  Brent [name] is an 60 y.o. male.  HPI: He presented to the emergency department yesterday with a 1 week history of right upper quadrant abdominal pain and associated nausea.  He was septic on presentation and has multiple severe medical comorbidities including ejection fraction of 15 to 20%.  As result, percutaneous cholecystostomy was recommended.  This was placed Friday by interventional radiology.  The patient reports that his abdomen continues to improve.  He does continue to require vasopressor support of his blood pressure.  He denies any nausea or vomiting.  He has remained afebrile.  Past Medical History:  Diagnosis Date  . CHF (congestive heart failure) (HCC)   . Chronic pain   . COPD (chronic obstructive pulmonary disease) (HCC)   . Depression   . Hepatitis C   . Hyperlipidemia   . Hypertension   . Spinal stenosis   . Subarachnoid hemorrhage [facility])     Past Surgical History:  Procedure Laterality Date  . LEFT HEART CATH AND CORONARY ANGIOGRAPHY N/A 03/04/2019   Procedure: LEFT HEART CATH AND CORONARY ANGIOGRAPHY;  Surgeon: Lamar Blinks, MD;  Location: [facility] INVASIVE CV LAB;  Service: Cardiovascular;  Laterality: N/A;    History reviewed. No pertinent family history.  Social History:  reports that he has been smoking cigarettes. He has been smoking about 2.00 packs per day. He has never used smokeless tobacco. He reports previous alcohol use. He reports previous drug use.  Allergies: No Known Allergies  Medications: I have reviewed the patient's current medications.  Results for orders placed or performed during the hospital encounter of 05/13/20 (from the past 48 hour(s))  Urinalysis, Complete w Microscopic Urine, Catheterized     Status: Abnormal   Collection Time: 05/13/20  4:00 PM  Result Value Ref Range   Color, Urine YELLOW (A) YELLOW   APPearance CLOUDY (A) CLEAR   Specific Gravity, Urine 1.008 1.005 - 1.030   pH 5.0 5.0  - 8.0   Glucose, UA >=500 (A) NEGATIVE mg/dL   Hgb urine dipstick MODERATE (A) NEGATIVE   Bilirubin Urine NEGATIVE NEGATIVE   Ketones, ur NEGATIVE NEGATIVE mg/dL   Protein, ur 30 (A) NEGATIVE mg/dL   Nitrite NEGATIVE NEGATIVE   Leukocytes,Ua NEGATIVE NEGATIVE   RBC / HPF 0-5 0 - 5 RBC/hpf   WBC, UA 0-5 0 - 5 WBC/hpf   Bacteria, UA RARE (A) NONE SEEN   Squamous Epithelial / LPF NONE SEEN 0 - 5   Mucus PRESENT     Comment: Performed at [facility], [address] [place], [place] [address]  Glucose, capillary     Status: Abnormal   Collection Time: 05/13/20  5:09 PM  Result Value Ref Range   Glucose-Capillary 119 (H) 70 - 99 mg/dL    Comment: Glucose reference range applies only to samples taken after fasting for at least 8 hours.  Glucose, capillary     Status: Abnormal   Collection Time: 05/13/20  7:27 PM  Result Value Ref Range   Glucose-Capillary 199 (H) 70 - 99 mg/dL    Comment: Glucose reference range applies only to samples taken after fasting for at least 8 hours.  Glucose, capillary     Status: Abnormal   Collection Time: 05/13/20 11:46 PM  Result Value Ref Range   Glucose-Capillary 193 (H) 70 - 99 mg/dL    Comment: Glucose reference range applies only to samples taken after fasting for at least 8 hours.  Glucose, capillary  Status: Abnormal   Collection Time: 05/14/20  3:55 AM  Result Value Ref Range   Glucose-Capillary 177 (H) 70 - 99 mg/dL    Comment: Glucose reference range applies only to samples taken after fasting for at least 8 hours.  Renal function panel     Status: Abnormal   Collection Time: 05/14/20  5:00 AM  Result Value Ref Range   Sodium 137 135 - 145 mmol/L   Potassium 3.8 3.5 - 5.1 mmol/L   Chloride 102 98 - 111 mmol/L   CO2 26 22 - 32 mmol/L   Glucose, Bld 156 (H) 70 - 99 mg/dL    Comment: Glucose reference range applies only to samples taken after fasting for at least 8 hours.   BUN 28 (H) 6 - 20 mg/dL   Creatinine, Ser 3.22 0.61 -  1.24 mg/dL   Calcium 8.3 (L) 8.9 - 10.3 mg/dL   Phosphorus 3.4 2.5 - 4.6 mg/dL   Albumin 2.5 (L) 3.5 - 5.0 g/dL   GFR, Estimated >02 >54 mL/min    Comment: (NOTE) Calculated using the CKD-EPI Creatinine Equation (2021)    Anion gap 9 5 - 15    Comment: Performed at [facility], [address] [place], [place] [address]  Magnesium     Status: Abnormal   Collection Time: 05/14/20  5:00 AM  Result Value Ref Range   Magnesium 2.9 (H) 1.7 - 2.4 mg/dL    Comment: Performed at [facility], [address] [place], [place] [address]  CBC with Differential/Platelet     Status: Abnormal   Collection Time: 05/14/20  5:00 AM  Result Value Ref Range   WBC 25.6 (H) 4.0 - 10.5 K/uL   RBC 4.51 4.22 - 5.81 MIL/uL   Hemoglobin 13.5 13.0 - 17.0 g/dL   HCT 31.5 17.6 - 16.0 %   MCV 87.1 80.0 - 100.0 fL   MCH 29.9 26.0 - 34.0 pg   MCHC 34.4 30.0 - 36.0 g/dL   RDW 73.7 10.6 - 26.9 %   Platelets 292 150 - 400 K/uL   nRBC 0.0 0.0 - 0.2 %   Neutrophils Relative % 90 %   Neutro Abs 22.6 (H) 1.7 - 7.7 K/uL   Lymphocytes Relative 3 %   Lymphs Abs 0.8 0.7 - 4.0 K/uL   Monocytes Relative 6 %   Monocytes Absolute 1.5 (H) 0.1 - 1.0 K/uL   Eosinophils Relative 0 %   Eosinophils Absolute 0.0 0.0 - 0.5 K/uL   Basophils Relative 0 %   Basophils Absolute 0.1 0.0 - 0.1 K/uL   WBC Morphology MORPHOLOGY UNREMARKABLE    RBC Morphology MORPHOLOGY UNREMARKABLE    Smear Review Normal platelet morphology    Immature Granulocytes 1 %   Abs Immature Granulocytes 0.31 (H) 0.00 - 0.07 K/uL    Comment: Performed at [facility], [address] [place], [place] [address]  Hemoglobin A1c     Status: Abnormal   Collection Time: 05/14/20  5:00 AM  Result Value Ref Range   Hgb A1c MFr Bld 6.1 (H) 4.8 - 5.6 %    Comment: (NOTE) Pre diabetes:          5.7%-6.4%  Diabetes:              >6.4%  Glycemic control for   <7.0% adults with diabetes    Mean Plasma Glucose 128.37 mg/dL     Comment: Performed at [facility], [address], [place],  [place] [address]  Glucose, capillary     Status: Abnormal   Collection Time: 05/14/20  7:36 AM  Result Value Ref Range   Glucose-Capillary 181 (H) 70 - 99 mg/dL    Comment: Glucose reference range applies only to samples taken after fasting for at least 8 hours.  Glucose, capillary     Status: Abnormal   Collection Time: 05/14/20 11:41 AM  Result Value Ref Range   Glucose-Capillary 132 (H) 70 - 99 mg/dL    Comment: Glucose reference range applies only to samples taken after fasting for at least 8 hours.  Glucose, capillary     Status: Abnormal   Collection Time: 05/14/20  3:36 PM  Result Value Ref Range   Glucose-Capillary 168 (H) 70 - 99 mg/dL    Comment: Glucose reference range applies only to samples taken after fasting for at least 8 hours.  Glucose, capillary     Status: Abnormal   Collection Time: 05/14/20  7:40 PM  Result Value Ref Range   Glucose-Capillary 128 (H) 70 - 99 mg/dL    Comment: Glucose reference range applies only to samples taken after fasting for at least 8 hours.  Glucose, capillary     Status: Abnormal   Collection Time: 05/14/20 11:52 PM  Result Value Ref Range   Glucose-Capillary 181 (H) 70 - 99 mg/dL    Comment: Glucose reference range applies only to samples taken after fasting for at least 8 hours.  Glucose, capillary     Status: Abnormal   Collection Time: 05/15/20  3:25 AM  Result Value Ref Range   Glucose-Capillary 134 (H) 70 - 99 mg/dL    Comment: Glucose reference range applies only to samples taken after fasting for at least 8 hours.  Basic metabolic panel     Status: Abnormal   Collection Time: 05/15/20  4:06 AM  Result Value Ref Range   Sodium 137 135 - 145 mmol/L   Potassium 3.7 3.5 - 5.1 mmol/L   Chloride 102 98 - 111 mmol/L   CO2 25 22 - 32 mmol/L   Glucose, Bld 169 (H) 70 - 99 mg/dL    Comment: Glucose reference range applies only to samples taken after fasting for at  least 8 hours.   BUN 37 (H) 6 - 20 mg/dL   Creatinine, Ser 0.73 0.61 - 1.24 mg/dL   Calcium 7.8 (L) 8.9 - 10.3 mg/dL   GFR, Estimated >71 >06 mL/min    Comment: (NOTE) Calculated using the CKD-EPI Creatinine Equation (2021)    Anion gap 10 5 - 15    Comment: Performed at [facility], [address] [place], [place] [address]  CBC     Status: Abnormal   Collection Time: 05/15/20  4:06 AM  Result Value Ref Range   WBC 24.4 (H) 4.0 - 10.5 K/uL   RBC 4.44 4.22 - 5.81 MIL/uL   Hemoglobin 13.3 13.0 - 17.0 g/dL   HCT 54.6 (L) 27.0 - 35.0 %   MCV 85.8 80.0 - 100.0 fL   MCH 30.0 26.0 - 34.0 pg   MCHC 34.9 30.0 - 36.0 g/dL   RDW 09.3 81.8 - 29.9 %   Platelets 354 150 - 400 K/uL   nRBC 0.0 0.0 - 0.2 %    Comment: Performed at [facility], [address], [place], [place] [address]  Magnesium     Status: Abnormal   Collection Time: 05/15/20  4:06 AM  Result Value Ref Range   Magnesium 2.6 (  H) 1.7 - 2.4 mg/dL    Comment: Performed at [facility], [address] [place], [place] [address]  Phosphorus     Status: None   Collection Time: 05/15/20  4:06 AM  Result Value Ref Range   Phosphorus 3.9 2.5 - 4.6 mg/dL    Comment: Performed at [facility], [address] [place], [place] [address]  Albumin     Status: Abnormal   Collection Time: 05/15/20  4:06 AM  Result Value Ref Range   Albumin 2.2 (L) 3.5 - 5.0 g/dL    Comment: Performed at [facility], [address] [place], [place] [address]  Glucose, capillary     Status: Abnormal   Collection Time: 05/15/20  7:43 AM  Result Value Ref Range   Glucose-Capillary 152 (H) 70 - 99 mg/dL    Comment: Glucose reference range applies only to samples taken after fasting for at least 8 hours.  Cortisol, Random     Status: None   Collection Time: 05/15/20  9:11 AM  Result Value Ref Range   Cortisol, Plasma 53.0 ug/dL    Comment: (NOTE) AM    6.7 - 22.6 ug/dL PM   <65.6        ug/dL Performed at [facility], [address] [place], [place] [address]   Glucose, capillary     Status: Abnormal   Collection Time: 05/15/20 11:21 AM  Result Value Ref Range   Glucose-Capillary 228 (H) 70 - 99 mg/dL    Comment: Glucose reference range applies only to samples taken after fasting for at least 8 hours.    CT PERC CHOLECYSTOSTOMY  Result Date: 05/13/2020 INDICATION: Sepsis, acute cholecystitis and need for percutaneous cholecystostomy tube. The patient is not currently a candidate for cholecystectomy. EXAM: CT PERCUTANEOUS CHOLECYSTOSTOMY MEDICATIONS: See below. ANESTHESIA/SEDATION: Moderate (conscious) sedation was employed during this procedure. A total of Versed 2.0 mg and Fentanyl 1- mcg was administered intravenously. Moderate Sedation Time: 12 minutes. The patient's level of consciousness and vital signs were monitored continuously by radiology nursing throughout the procedure under my direct supervision. FLUOROSCOPY TIME:  CT guidance was utilized. COMPLICATIONS: None immediate. PROCEDURE: Informed written consent was obtained from the patient after a thorough discussion of the procedural risks, benefits and alternatives. All questions were addressed. Maximal Sterile Barrier Technique was utilized including caps, mask, sterile gowns, sterile gloves, sterile drape, hand hygiene and skin antiseptic. A timeout was performed prior to the initiation of the procedure. Under CT guidance, an 18 gauge trocar needle was advanced into the gallbladder lumen. After return of bile, a guidewire was advanced, the tract dilated and a 10 French percutaneous drainage catheter placed. A sample of bile was sent for culture analysis. The drain was attached to a gravity drainage bag and secured at the skin with a Prolene retention suture and adhesive StatLock device. FINDINGS: There was return of dark colored bile with some mild debris from the gallbladder lumen. IMPRESSION: Percutaneous  cholecystostomy tube placement with 10 French drainage catheter placed into the gallbladder lumen. The drain was attached to gravity bag drainage. A bile sample was sent for culture analysis. Electronically Signed   By: Irish Lack M.D.   On: 05/13/2020 15:32    Review of Systems  All other systems reviewed and are negative.  Blood pressure 119/73, pulse 61, temperature 97.9 F (36.6 C), temperature source Axillary, resp. rate 18, height 6' (1.829 m), weight 91 kg, SpO2 99 %. Physical Exam Constitutional:  General: He is not in acute distress. HENT:     Head: Normocephalic and atraumatic.  Cardiovascular:     Rate and Rhythm: Normal rate and regular rhythm.  Pulmonary:     Effort: Pulmonary effort is normal. No respiratory distress.  Abdominal:     Palpations: Abdomen is soft.     Tenderness: There is abdominal tenderness. There is no guarding or rebound.     Comments: Percutaneous cholecystostomy drain in the right upper quadrant.  The bile is no longer purulent; it is clear and typical tan-green in color.  No peritoneal signs on physical exam.  Genitourinary:    Comments: Deferred Musculoskeletal:        General: No deformity or signs of injury.  Skin:    General: Skin is warm and dry.     Coloration: Skin is not jaundiced.  Neurological:     General: No focal deficit present.     Mental Status: He is alert.  Psychiatric:        Mood and Affect: Mood normal.        Behavior: Behavior normal.     Assessment/Plan: This is a 60 year old man with multiple significant medical comorbidities who presented to the hospital with sepsis secondary to acute cholecystitis.  He has now had a percutaneous cholecystostomy tube placed and seems to be improving.  He is still requiring vasopressors, however. --Continue supportive care, fluids, and antibiotics. --Following discharge, he should follow-up with Dr. Tonna BoehringerSakai for reevaluation to determine whether or not he would be a candidate  for future cholecystectomy. --General surgery will continue to follow.  Duanne GuessJennifer Clementina Mareno 05/15/2020, 3:00 PM

## 2020-05-15 NOTE — Plan of Care (Signed)
Neuro: stable at baseline Resp: stable on room air CV: afebrile, weaned off Levo, Midodrine started, BP stablizing GIGU: foley in place, diet advanced to Carb modified, heart healthy-tolerating well Skin: clean dry and intact Social: Wife visiting this afternoon, all questions and concerns addressed  Events: patient transitioned to stepdown  Problem: Education: Goal: Knowledge of General Education information will improve Description: Including pain rating scale, medication(s)/side effects and non-pharmacologic comfort measures Outcome: Progressing   Problem: Health Behavior/Discharge Planning: Goal: Ability to manage health-related needs will improve Outcome: Progressing   Problem: Clinical Measurements: Goal: Ability to maintain clinical measurements within normal limits will improve Outcome: Progressing Goal: Will remain free from infection Outcome: Progressing Goal: Diagnostic test results will improve Outcome: Progressing Goal: Respiratory complications will improve Outcome: Progressing Goal: Cardiovascular complication will be avoided Outcome: Progressing   Problem: Activity: Goal: Risk for activity intolerance will decrease Outcome: Progressing   Problem: Nutrition: Goal: Adequate nutrition will be maintained Outcome: Progressing   Problem: Coping: Goal: Level of anxiety will decrease Outcome: Progressing   Problem: Elimination: Goal: Will not experience complications related to bowel motility Outcome: Progressing Goal: Will not experience complications related to urinary retention Outcome: Progressing   Problem: Pain Managment: Goal: General experience of comfort will improve Outcome: Progressing   Problem: Safety: Goal: Ability to remain free from injury will improve Outcome: Progressing   Problem: Skin Integrity: Goal: Risk for impaired skin integrity will decrease Outcome: Progressing

## 2020-05-15 NOTE — Progress Notes (Signed)
Pharmacy Antibiotic Note  Brent [name] is a 60 y.o. male admitted on 05/13/2020 with acute cholecystitis. Pt presented with DOB, dizziness, and emesis x 3 days. Pt endorses R sided and epigastric abdominal pain. CT concerning for acute calculus cholecystitis. IR recommending cholecystostomy tube. Pt is not a candidate for surgery. PMH includes CHF of 25% s/p defibrillator, cirrhosis, COPD, HTN, and HLD. Pharmacy has been consulted for Zosyn dosing.  Plan: Day 3 of abx. Continue Zosyn 3.375g IV q8h (4 hour infusion)  Height: 6' (182.9 cm) Weight: 91 kg (200 lb 9.9 oz) IBW/kg (Calculated) : 77.6  Temp (24hrs), Avg:97.8 F (36.6 C), Min:97.4 F (36.3 C), Max:98.4 F (36.9 C)  Recent Labs  Lab 05/13/20 0055 05/13/20 0333 05/14/20 0500 05/15/20 0406  WBC 18.0*  --  25.6* 24.4*  CREATININE 2.49*  --  1.06 1.09  LATICACIDVEN  --  1.6  --   --     Estimated Creatinine Clearance: 81.1 mL/min (by C-G formula based on SCr of 1.09 mg/dL).    No Known Allergies  Antimicrobials this admission: 04/08 Cefepime >> x 0 (DCd, changed to Zosyn) 04/08 Vancomycin >> x 1 04/08 Zosyn >>  Microbiology results: 04/8 BCx: NGTD  Thank you for allowing pharmacy to be a part of this patient's care.  Ronnald Ramp, PharmD 05/15/2020 7:32 AM

## 2020-05-16 DIAGNOSIS — R6521 Severe sepsis with septic shock: Secondary | ICD-10-CM | POA: Diagnosis not present

## 2020-05-16 DIAGNOSIS — A419 Sepsis, unspecified organism: Secondary | ICD-10-CM | POA: Diagnosis not present

## 2020-05-16 DIAGNOSIS — N179 Acute kidney failure, unspecified: Secondary | ICD-10-CM | POA: Diagnosis not present

## 2020-05-16 LAB — CBC
HCT: 37.7 % — ABNORMAL LOW (ref 39.0–52.0)
Hemoglobin: 12.7 g/dL — ABNORMAL LOW (ref 13.0–17.0)
MCH: 29.1 pg (ref 26.0–34.0)
MCHC: 33.7 g/dL (ref 30.0–36.0)
MCV: 86.5 fL (ref 80.0–100.0)
Platelets: 387 10*3/uL (ref 150–400)
RBC: 4.36 MIL/uL (ref 4.22–5.81)
RDW: 13.8 % (ref 11.5–15.5)
WBC: 16.1 10*3/uL — ABNORMAL HIGH (ref 4.0–10.5)
nRBC: 0 % (ref 0.0–0.2)

## 2020-05-16 LAB — GLUCOSE, CAPILLARY
Glucose-Capillary: 174 mg/dL — ABNORMAL HIGH (ref 70–99)
Glucose-Capillary: 160 mg/dL — ABNORMAL HIGH (ref 70–99)
Glucose-Capillary: 183 mg/dL — ABNORMAL HIGH (ref 70–99)
Glucose-Capillary: 104 mg/dL — ABNORMAL HIGH (ref 70–99)
Glucose-Capillary: 133 mg/dL — ABNORMAL HIGH (ref 70–99)
Glucose-Capillary: 143 mg/dL — ABNORMAL HIGH (ref 70–99)

## 2020-05-16 LAB — BASIC METABOLIC PANEL
Anion gap: 9 (ref 5–15)
BUN: 43 mg/dL — ABNORMAL HIGH (ref 6–20)
CO2: 26 mmol/L (ref 22–32)
Calcium: 7.6 mg/dL — ABNORMAL LOW (ref 8.9–10.3)
Chloride: 103 mmol/L (ref 98–111)
Creatinine, Ser: 1.16 mg/dL (ref 0.61–1.24)
GFR, Estimated: >60 mL/min (ref >60)
Glucose, Bld: 153 mg/dL — ABNORMAL HIGH (ref 70–99)
Potassium: 3.6 mmol/L (ref 3.5–5.1)
Sodium: 138 mmol/L (ref 135–145)

## 2020-05-16 LAB — MAGNESIUM: Magnesium: 2.6 mg/dL — ABNORMAL HIGH (ref 1.7–2.4)

## 2020-05-16 LAB — PHOSPHORUS: Phosphorus: 4.6 mg/dL (ref 2.5–4.6)

## 2020-05-16 MED ORDER — SODIUM CHLORIDE FLUSH 0.9 % IV SOLN
5.0000 mL | INTRAVENOUS | 3 refills | Status: DC | PRN
Start: 2020-05-16 — End: 2020-07-13

## 2020-05-16 MED ORDER — LACTATED RINGERS IV SOLN: INTRAVENOUS | Status: AC

## 2020-05-16 MED ORDER — POTASSIUM CHLORIDE CRYS ER 20 MEQ PO TBCR
40.0000 meq | EXTENDED_RELEASE_TABLET | Freq: Once | ORAL | Status: AC
  Administered 2020-05-16: 40 meq via ORAL
  Filled 2020-05-16: qty 2

## 2020-05-16 NOTE — Progress Notes (Signed)
Subjective:  CC: Brent [name] is a 60 y.o. male  Hospital stay day 3,   acute cholecystitis  HPI: No acute issues overnight.  No pain, tolerating regular diet.  ROS:  General: Denies weight loss, weight gain, fatigue, fevers, chills, and night sweats. Heart: Denies chest pain, palpitations, racing heart, irregular heartbeat, leg pain or swelling, and decreased activity tolerance. Respiratory: Denies breathing difficulty, shortness of breath, wheezing, cough, and sputum. GI: Denies change in appetite, heartburn, nausea, vomiting, constipation, diarrhea, and blood in stool. GU: Denies difficulty urinating, pain with urinating, urgency, frequency, blood in urine.   Objective:   Temp:  [97.6 F (36.4 C)-97.8 F (36.6 C)] 97.7 F (36.5 C) (04/11 0400) Pulse Rate:  [59-92] 63 (04/11 0831) Resp:  [12-21] 15 (04/11 0831) BP: (88-158)/(47-88) 105/70 (04/11 0831) SpO2:  [91 %-100 %] 100 % (04/11 0831) Weight:  [94 kg] 94 kg (04/11 0500)     Height: 6' (182.9 cm) Weight: 94 kg BMI (Calculated): 28.1   Intake/Output this shift:   Intake/Output Summary (Last 24 hours) at 05/16/2020 1003 Last data filed at 05/16/2020 0950 Gross per 24 hour  Intake 474.21 ml  Output 1065 ml  Net -590.79 ml    Constitutional :  alert, cooperative, appears stated age and no distress  Respiratory:  clear to auscultation bilaterally  Cardiovascular:  regular rate and rhythm  Gastrointestinal: soft, non-tender; bowel sounds normal; no masses,  no organomegaly. Drain with dark bilious output.  Skin: Cool and moist.   Psychiatric: Normal affect, non-agitated, not confused       LABS:  CMP Latest Ref Rng & Units 05/16/2020 05/15/2020 05/14/2020  Glucose 70 - 99 mg/dL 671(I) 458(K) 998(P)  BUN 6 - 20 mg/dL 38(S) 50(N) 39(J)  Creatinine 0.61 - 1.24 mg/dL 6.73 4.19 3.79  Sodium 135 - 145 mmol/L 138 137 137  Potassium 3.5 - 5.1 mmol/L 3.6 3.7 3.8  Chloride 98 - 111 mmol/L 103 102 102  CO2 22 - 32 mmol/L 26 25 26    Calcium 8.9 - 10.3 mg/dL 7.6(L) 7.8(L) 8.3(L)  Total Protein 6.5 - 8.1 g/dL - - -  Total Bilirubin 0.3 - 1.2 mg/dL - - -  Alkaline Phos 38 - 126 U/L - - -  AST 15 - 41 U/L - - -  ALT 0 - 44 U/L - - -   CBC Latest Ref Rng & Units 05/16/2020 05/15/2020 05/14/2020  WBC 4.0 - 10.5 K/uL 16.1(H) 24.4(H) 25.6(H)  Hemoglobin 13.0 - 17.0 g/dL 12.7(L) 13.3 13.5  Hematocrit 39.0 - 52.0 % 37.7(L) 38.1(L) 39.3  Platelets 150 - 400 K/uL 387 354 292    RADS: n/a Assessment:   Acute cholecystitis, s/p chole tube placement by IR due to high risk candidate for lap chole.  Stabilizing, off pressors at time of exam, asymptomatic. Continue current management per medical team.  Ok to d/c from surgery standpoint once hemodynamically stable, with complete of abx course at home if still required.  Drain flush instructions, Rx for flush, and followup appt with surgery as well as IR for drain monitoring and care all placed within chart.  Call surgery if any additional questions or concerns.
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
## 2020-05-16 NOTE — Discharge Instructions (Signed)
Flush drainage catheter three times a day as instructed per nursing.  Call Radiology office with any questions or concerns regarding the drain.

## 2020-05-16 NOTE — Evaluation (Signed)
Physical Therapy Evaluation Patient Details Name: Brent [name] MRN: [id number] DOB: March 25, 1960 Today's Date: 05/16/2020   History of Present Illness  Pt is a 60 y/o M admitted on 05/13/20 with c/c of abdominal pain & SOB. Pt admitted for tx of severe sepsis with septic shock 2/2 acute cholecystitis requiring vasopressors. Pt underwent IR guided percutaneous cholecystostomy tube placement on 05/14/20. PMH: SAH, spinal stenosis, HTN, HLD, Hep C, depression, COPD, chronic pain, chronic systolic CHF EF 25-30%, ventricular tachycardia requiring ICD  Clinical Impression  Pt seen for PT evaluation with pt AxO x 4. Pt very pleasant & motivated to participate. Pt is able to complete bed mobility with mod I, transfers with CGA without AD. Pt ambulates 200 ft without AD & min assist with R knee weakness noted but no overt LOB.  Pt performs BLE strengthening exercises with instructional cuing from PT. Pt would benefit from acute PT services to address high level balance, gait, and endurance prior to d/c home.  BP checked in LUE: Supine: 112/77 mmHg (MAP 88) After stand pivot to recliner, sitting: BP 118/74 mmHg (MAP 88) Sitting in recliner after gait: 133/74 mmHg (MAP 92) Pt without c/o adverse symptoms during mobility.     Follow Up Recommendations Home health PT    Equipment Recommendations  Rolling walker with 5" wheels    Recommendations for Other Services       Precautions / Restrictions Precautions Precautions: Fall Restrictions Weight Bearing Restrictions: No Other Position/Activity Restrictions: ICD precautions      Mobility  Bed Mobility Overal bed mobility: Modified Independent Bed Mobility: Supine to Sit     Supine to sit: Modified independent (Device/Increase time);HOB elevated (use of bed rails)          Transfers Overall transfer level: Needs assistance Equipment used: None Transfers: Sit to/from [employer] Sit to Stand: Min guard Stand pivot  transfers: Min guard          Ambulation/Gait Ambulation/Gait assistance: Min assist Gait Distance (Feet): 200 Feet Assistive device: None Gait Pattern/deviations: Decreased step length - right;Decreased weight shift to right        Stairs            Wheelchair Mobility    Modified Rankin (Stroke Patients Only)       Balance Overall balance assessment: Needs assistance Sitting-balance support: Feet supported Sitting balance-Leahy Scale: Good Sitting balance - Comments: supervision sitting EOB   Standing balance support: No upper extremity supported;During functional activity Standing balance-Leahy Scale: Fair                               Pertinent Vitals/Pain Pain Assessment: No/denies pain    Home Living Family/patient expects to be discharged to:: Private residence Living Arrangements: Spouse/significant other (girlfriend of 19 years) Available Help at Discharge: Family;Available 24 hours/day Type of Home: House Home Access: Stairs to enter Entrance Stairs-Rails: Right Entrance Stairs-Number of Steps: 3 Home Layout: One level Home Equipment: Wheelchair - manual (rollator, hurry cane)      Prior Function           Comments: Independent without AD but does report he'll "grab" a cane to use, if he feels like it. Does endorse 2-3 falls in the past 6 months     Hand Dominance   Dominant Hand: Right    Extremity/Trunk Assessment   Upper Extremity Assessment Upper Extremity Assessment: Generalized weakness    Lower Extremity Assessment Lower Extremity  Assessment: Generalized weakness (RLE knee weakness observed during gait - not formally tested)       Communication   Communication: No difficulties  Cognition Arousal/Alertness: Awake/alert Behavior During Therapy: WFL for tasks assessed/performed Overall Cognitive Status: Within Functional Limits for tasks assessed                                         General Comments General comments (skin integrity, edema, etc.): SPO2 initially reading as low as 68% after pt transfers to recliner but pt not in distress & notes only slight SOB, switched oxygen reader to ear with more improved waveform & SpO2 100%, but does drop as low as 88% with gait but quickly recovers during activity with pursed lip breathing    Exercises General Exercises - Lower Extremity Long Arc Quad: AROM;Strengthening;Both;10 reps;Seated Hip ABduction/ADduction: AROM;Strengthening;Both;10 reps;Seated (hip adduction pillow squeezes) Hip Flexion/Marching: AROM;Strengthening;Both;10 reps;Seated   Assessment/Plan    PT Assessment Patient needs continued PT services  PT Problem List Decreased strength;Decreased mobility;Decreased knowledge of precautions;Decreased activity tolerance;Cardiopulmonary status limiting activity;Decreased knowledge of use of DME;Decreased balance       PT Treatment Interventions DME instruction;Therapeutic activities;Modalities;Gait training;Therapeutic exercise;Patient/family education;Stair training;Balance training;Functional mobility training;Neuromuscular re-education;Manual techniques    PT Goals (Current goals can be found in the Care Plan section)  Acute Rehab PT Goals Patient Stated Goal: go home PT Goal Formulation: With patient Time For Goal Achievement: 05/30/20 Potential to Achieve Goals: Good    Frequency Min 2X/week   Barriers to discharge        Co-evaluation               AM-PAC PT "6 Clicks" Mobility  Outcome Measure Help needed turning from your back to your side while in a flat bed without using bedrails?: None Help needed moving from lying on your back to sitting on the side of a flat bed without using bedrails?: None Help needed moving to and from a bed to a chair (including a wheelchair)?: A Little Help needed standing up from a chair using your arms (e.g., wheelchair or bedside chair)?: A Little Help needed to  walk in hospital room?: A Little Help needed climbing 3-5 steps with a railing? : A Little 6 Click Score: 20    End of Session Equipment Utilized During Treatment: Gait belt Activity Tolerance: Patient tolerated treatment well Patient left: in chair;with call bell/phone within reach Nurse Communication: Mobility status PT Visit Diagnosis: Unsteadiness on feet (R26.81);Muscle weakness (generalized) (M62.81)    Time: 7616-0737 PT Time Calculation (min) (ACUTE ONLY): 26 min   Charges:   PT Evaluation $PT Eval Moderate Complexity: 1 Mod PT Treatments $Therapeutic Activity: 8-22 mins        Aleda Grana, PT, DPT 05/16/20, 3:34 PM   Sandi Mariscal 05/16/2020, 3:32 PM

## 2020-05-16 NOTE — Progress Notes (Signed)
NAME:  Brent [name], MRN:  [id number], DOB:  Aug 12, 1960, LOS: 3 ADMISSION DATE:  05/13/2020, CONSULTATION DATE: 05/13/20 REFERRING MD: Dr. Don Perking, CHIEF COMPLAINT:  Abdominal pain and shortness of breath  Brief History of Present Illness:  This is a 60 y.o male significant PMH as below who presented to ER on 04/8 with chief complaints of shortness of breath, dizziness, right-sided and epigastric abdominal pain, nausea and vomiting x3 days.  Patient was subsequently admitted with severe sepsis with septic shock secondary to acute cholecystitis requiring vasopressors.  Due to high risk of perioperative complications, patient underwent IR guided percutaneous cholecystostomy tube placement 05/14/2020.  Pertinent  Medical History  Subarachnoid Hemorrhage Spinal Stenosis HTN HLD Hepatitis C Depression COPD Chronic Pain  Chronic Systolic CHF~EF 25 to 30% via Echo 03/01/2019 Ventricular Tachycardia requiring ICD   Significant Hospital Events: Including procedures, antibiotic start and stop dates in addition to other pertinent events   4/08: Patient admitted to ICU with hypovolemia and septic shock requiring Levophed gtt 4/08: s/p right IJ CVC placement 4/08: s/p IR guided percutaneous cholecystotomy tube placement  Cultures:  4/08: BCx: No growth to date   Antimicrobials:  04/08 Cefepime >> x 0 (DCd, changed to Zosyn) 04/08 Vancomycin >> x 1 04/08 Zosyn >>   Consults  General Surgery IR  Interim History / Subjective:  Levophed restarted overnight. Down to 56mcg/min.  Objective   Blood pressure 113/77, pulse (!) 59, temperature 97.7 F (36.5 C), temperature source Oral, resp. rate 15, height 6' (1.829 m), weight 94 kg, SpO2 97 %. CVP:  [0 mmHg-11 mmHg] 9 mmHg      Intake/Output Summary (Last 24 hours) at 05/16/2020 0843 Last data filed at 05/16/2020 0600 Gross per 24 hour  Intake 233.04 ml  Output 1140 ml  Net -906.96 ml   Filed Weights   05/14/20 0500 05/15/20 0500 05/16/20  0500  Weight: 91 kg 91 kg 94 kg    Physical Examination: Constitutional: no acute distress sitting in bed  Eyes: EOMI, pupils equal Ears, nose, mouth, and throat: trachea midline, MMM Cardiovascular: RRR, ext warm Respiratory: clear, no wheezing or accessory muscle use Gastrointestinal: RUQ drain with bilious output, soft, hypoactive BS Skin: No rashes, normal turgor Neurologic: moves all 4 ext to command Psychiatric: RASS 0    Labs/imaging that I havepersonally reviewed  (right click and "Reselect all SmartList Selections" daily)   K low: repleting CBG okay BUN/Cr creeping up WBC improved  Resolved Hospital Problem list   Hyponatremia   Assessment & Plan:  60 y.o male admitted with septic and hypovolemic shock secondary to acute cholecystitis requiring vasopressors s/p  IR guided percutaneous cholecystostomy tube placement 05/14/2020.   Sepsis with septic and hypovolemic shock secondary to Acute Cholecystitis S/P IR guided percutaneous cholecystostomy tube placement 05/14/2020. Improved but still requiring vasopressor to maintain MAP>60 Acute on chronic Respiratory Failure secondary to ACOPD PMHx: Current everyday smoker Chronic Systolic Congestive Heart Failure, ischemic, appears dry - Hold Lasix in the setting of hypotension - Hold Entresto, Coreg, Imdur for now - Continue RUQ drain, OP f/u in surgery clinic to consider cholecystectomy - LR x 1L, continue to try to wean levophed; continue midodrine - Continue dulera in lieu of PTA symbicort - Wean stress steroids once off pressors x 24h - Continue PTA oxycodone - SSI while on stress steroids - 7 days zosyn reasonable - Start bowel regimen if constipated  Best practice (right click and "Reselect all SmartList Selections" daily)  Diet:  Oral Pain/Anxiety/Delirium protocol (  if indicated): No VAP protocol (if indicated): Not indicated DVT prophylaxis: Subcutaneous Heparin GI prophylaxis: H2B Glucose control:  SSI  Yes Central venous access:  Yes, and it is still needed Arterial line:  N/A Foley:  N/A Mobility:  bed rest  PT consulted: N/A Last date of multidisciplinary goals of care discussion [4/8] Code Status:  full code Disposition: ICU   Patient critically ill due to shock Interventions to address this today pressor titration, IVF, abx Risk of deterioration without these interventions is high  I personally spent 35 minutes providing critical care not including any separately billable procedures  Myrla Halsted MD [facility]  Prefer epic messenger for cross cover needs If after hours, please call E-link

## 2020-05-16 NOTE — Consult Note (Signed)
PHARMACY CONSULT NOTE  Pharmacy Consult for Electrolyte Monitoring and Replacement   Recent Labs: Potassium (mmol/L)  Date Value  05/16/2020 3.6   Magnesium (mg/dL)  Date Value  58/85/0277 2.6 (H)   Calcium (mg/dL)  Date Value  41/28/7867 7.6 (L)   Albumin (g/dL)  Date Value  67/20/9470 2.2 (L)   Phosphorus (mg/dL)  Date Value  96/28/3662 4.6   Sodium (mmol/L)  Date Value  05/16/2020 138   Corrected Ca: 9.04 mg/dL  Assessment: 60 yo M presented with shortness of breath, dizziness, and emesis x 3 days. PMH includes CHF with EF of 25% s/p defibrillator, cirrhosis, COPD, HTN, and HLD. Pharmacy consulted to monitor and replace electrolytes.  MIVF: lactated ringers at 100 mL/hr  Goal of Therapy:  K 4.0 - 5.1 mmol/L Mg 2.0 - 2.4 mg/dL All Other Electrolytes WNL  Plan:   KCl 40 mEq po x 1   Re-check electrolytes with AM labs  Lowella Bandy, PharmD 05/16/2020 7:02 AM

## 2020-05-17 ENCOUNTER — Other Ambulatory Visit: Payer: Self-pay | Admitting: Family

## 2020-05-17 ENCOUNTER — Ambulatory Visit: Payer: Medicaid Other | Admitting: Family

## 2020-05-17 DIAGNOSIS — N179 Acute kidney failure, unspecified: Secondary | ICD-10-CM | POA: Diagnosis not present

## 2020-05-17 DIAGNOSIS — R6521 Severe sepsis with septic shock: Secondary | ICD-10-CM | POA: Diagnosis not present

## 2020-05-17 DIAGNOSIS — A419 Sepsis, unspecified organism: Secondary | ICD-10-CM | POA: Diagnosis not present

## 2020-05-17 LAB — CBC
HCT: 36.3 % — ABNORMAL LOW (ref 39.0–52.0)
Hemoglobin: 12.2 g/dL — ABNORMAL LOW (ref 13.0–17.0)
MCH: 29.5 pg (ref 26.0–34.0)
MCHC: 33.6 g/dL (ref 30.0–36.0)
MCV: 87.9 fL (ref 80.0–100.0)
Platelets: 376 10*3/uL (ref 150–400)
RBC: 4.13 MIL/uL — ABNORMAL LOW (ref 4.22–5.81)
RDW: 14.2 % (ref 11.5–15.5)
WBC: 17.1 10*3/uL — ABNORMAL HIGH (ref 4.0–10.5)
nRBC: 0 % (ref 0.0–0.2)

## 2020-05-17 LAB — BASIC METABOLIC PANEL
Anion gap: 8 (ref 5–15)
BUN: 41 mg/dL — ABNORMAL HIGH (ref 6–20)
CO2: 24 mmol/L (ref 22–32)
Calcium: 7.4 mg/dL — ABNORMAL LOW (ref 8.9–10.3)
Chloride: 105 mmol/L (ref 98–111)
Creatinine, Ser: 1.08 mg/dL (ref 0.61–1.24)
GFR, Estimated: >60 mL/min (ref >60)
Glucose, Bld: 136 mg/dL — ABNORMAL HIGH (ref 70–99)
Potassium: 3.9 mmol/L (ref 3.5–5.1)
Sodium: 137 mmol/L (ref 135–145)

## 2020-05-17 LAB — GLUCOSE, CAPILLARY
Glucose-Capillary: 136 mg/dL — ABNORMAL HIGH (ref 70–99)
Glucose-Capillary: 176 mg/dL — ABNORMAL HIGH (ref 70–99)
Glucose-Capillary: 113 mg/dL — ABNORMAL HIGH (ref 70–99)

## 2020-05-17 LAB — MAGNESIUM: Magnesium: 2.4 mg/dL (ref 1.7–2.4)

## 2020-05-17 MED ORDER — POTASSIUM CHLORIDE CRYS ER 20 MEQ PO TBCR
20.0000 meq | EXTENDED_RELEASE_TABLET | Freq: Once | ORAL | Status: AC
  Administered 2020-05-17: 20 meq via ORAL
  Filled 2020-05-17: qty 1

## 2020-05-17 MED ORDER — FAMOTIDINE 20 MG PO TABS
20.0000 mg | ORAL_TABLET | Freq: Every day | ORAL | Status: DC
  Administered 2020-05-17: 20 mg via ORAL
  Filled 2020-05-17: qty 1

## 2020-05-17 MED ORDER — POLYETHYLENE GLYCOL 3350 17 G PO PACK
17.0000 g | PACK | Freq: Every day | ORAL | Status: DC
  Administered 2020-05-17 – 2020-05-18 (×2): 17 g via ORAL
  Filled 2020-05-17 (×2): qty 1

## 2020-05-17 MED ORDER — DOCUSATE SODIUM 100 MG PO CAPS
100.0000 mg | ORAL_CAPSULE | Freq: Two times a day (BID) | ORAL | Status: DC
  Administered 2020-05-17 – 2020-05-18 (×2): 100 mg via ORAL
  Filled 2020-05-17 (×2): qty 1

## 2020-05-17 NOTE — Plan of Care (Signed)
  Problem: Education: Goal: Knowledge of General Education information will improve Description: Including pain rating scale, medication(s)/side effects and non-pharmacologic comfort measures 05/17/2020 1530 by Ansel Bong, RN Outcome: Progressing 05/17/2020 1530 by Ansel Bong, RN Outcome: Progressing   Problem: Health Behavior/Discharge Planning: Goal: Ability to manage health-related needs will improve 05/17/2020 1530 by Ansel Bong, RN Outcome: Progressing 05/17/2020 1530 by Ansel Bong, RN Outcome: Progressing   Problem: Clinical Measurements: Goal: Ability to maintain clinical measurements within normal limits will improve 05/17/2020 1530 by Ansel Bong, RN Outcome: Progressing 05/17/2020 1530 by Ansel Bong, RN Outcome: Progressing Goal: Will remain free from infection 05/17/2020 1530 by Ansel Bong, RN Outcome: Progressing 05/17/2020 1530 by Ansel Bong, RN Outcome: Progressing Goal: Diagnostic test results will improve 05/17/2020 1530 by Ansel Bong, RN Outcome: Progressing 05/17/2020 1530 by Ansel Bong, RN Outcome: Progressing Goal: Respiratory complications will improve 05/17/2020 1530 by Ansel Bong, RN Outcome: Progressing 05/17/2020 1530 by Ansel Bong, RN Outcome: Progressing Goal: Cardiovascular complication will be avoided 05/17/2020 1530 by Ansel Bong, RN Outcome: Progressing 05/17/2020 1530 by Ansel Bong, RN Outcome: Progressing   Problem: Activity: Goal: Risk for activity intolerance will decrease 05/17/2020 1530 by Ansel Bong, RN Outcome: Progressing 05/17/2020 1530 by Ansel Bong, RN Outcome: Progressing   Problem: Nutrition: Goal: Adequate nutrition will be maintained 05/17/2020 1530 by Ansel Bong, RN Outcome: Progressing 05/17/2020 1530 by Ansel Bong, RN Outcome: Progressing   Problem: Coping: Goal: Level of anxiety will decrease 05/17/2020 1530 by Ansel Bong, RN Outcome:  Progressing 05/17/2020 1530 by Ansel Bong, RN Outcome: Progressing   Problem: Elimination: Goal: Will not experience complications related to bowel motility 05/17/2020 1530 by Ansel Bong, RN Outcome: Progressing 05/17/2020 1530 by Ansel Bong, RN Outcome: Progressing Goal: Will not experience complications related to urinary retention 05/17/2020 1530 by Ansel Bong, RN Outcome: Progressing 05/17/2020 1530 by Ansel Bong, RN Outcome: Progressing   Problem: Pain Managment: Goal: General experience of comfort will improve 05/17/2020 1530 by Ansel Bong, RN Outcome: Progressing 05/17/2020 1530 by Ansel Bong, RN Outcome: Progressing   Problem: Safety: Goal: Ability to remain free from injury will improve 05/17/2020 1530 by Ansel Bong, RN Outcome: Progressing 05/17/2020 1530 by Ansel Bong, RN Outcome: Progressing   Problem: Skin Integrity: Goal: Risk for impaired skin integrity will decrease 05/17/2020 1530 by Ansel Bong, RN Outcome: Progressing 05/17/2020 1530 by Ansel Bong, RN Outcome: Progressing

## 2020-05-17 NOTE — Consult Note (Signed)
PHARMACY CONSULT NOTE  Pharmacy Consult for Electrolyte Monitoring and Replacement   Recent Labs: Potassium (mmol/L)  Date Value  05/17/2020 3.9   Magnesium (mg/dL)  Date Value  25/36/6440 2.4   Calcium (mg/dL)  Date Value  34/74/2595 7.4 (L)   Albumin (g/dL)  Date Value  63/87/5643 2.2 (L)   Phosphorus (mg/dL)  Date Value  32/95/1884 4.6   Sodium (mmol/L)  Date Value  05/17/2020 137   Corrected Ca: 9.04 mg/dL  Assessment: 60 yo M presented with shortness of breath, dizziness, and emesis x 3 days. PMH includes CHF with EF of 25% s/p defibrillator, cirrhosis, COPD, HTN, and HLD. Pharmacy consulted to monitor and replace electrolytes.  Goal of Therapy:  K 4.0 - 5.1 mmol/L Mg 2.0 - 2.4 mg/dL All Other Electrolytes WNL  Plan:   20 mEq oral KCl x 1  Because this consult was generated as part of a CCU order set and the patient is being transferred to floor care pharmacy will sign off for now  Lowella Bandy, PharmD 05/17/2020 7:06 AM

## 2020-05-17 NOTE — TOC Initial Note (Addendum)
Transition of Care [facility]) - Initial/Assessment Note    Patient Details  Name: Brent [name] MRN: [id number] Date of Birth: August 08, 1960  Transition of [facility]) CM/SW Contact:    Joseph Art, LCSWA Phone Number: 05/17/2020, 1:28 PM  Clinical Narrative:                  Patient from home presents to [facility] due to SOB, dizziness and emesis. Main contact LAWRENCE,MARGART (Spouse)  [phone]. Patient is able to ambulate with walker and is independent with minimal assistance at baseline.  Patient uses a walker at home.  CSW explained TOC role in patient care.  CSW explained the PT/OT recommendations and Home Health process and timeline.  CSW stated that I would assist with finding a home health agency but there may be difficulty due to the patient's insurance. The patient stated he understood and he had difficulty obtaining home health last time. CSW stated the patient could also go to outpatient therapy if he preferred.  The patient state wasn't sure if he needed home health, "because I feel fine".  CSW asked if it was ok for me to find an agency that may take him and he stated "yes".    CSW contacted the following home  health agencies: [facility], [facility], [facility], [facility], [facility], [facility], [facility], [facility].  Home Health svcs pending reply.  Expected Discharge Plan: Home w Home Health Services Barriers to Discharge: Continued Medical Work up   Patient Goals and CMS Choice Patient states their goals for this hospitalization and ongoing recovery are:: To get back home soon.      Expected Discharge Plan and Services Expected Discharge Plan: Home w Home Health Services In-house Referral: Clinical Social Work   Post Acute Care Choice: Home Health Living arrangements for the past 2 months: Single Family Home                           HH Arranged: PT,OT          Prior Living Arrangements/Services Living arrangements for the past 2 months: Single  Family Home Lives with:: Spouse Patient language and need for interpreter reviewed:: Yes Do you feel safe going back to the place where you live?: Yes      Need for Family Participation in Patient Care: No (Comment) Care giver support system in place?: Yes (comment)   Criminal Activity/Legal Involvement Pertinent to Current Situation/Hospitalization: No - Comment as needed  Activities of Daily Living Home Assistive Devices/Equipment: None ADL Screening (condition at time of admission) Patient's cognitive ability adequate to safely complete daily activities?: Yes Is the patient deaf or have difficulty hearing?: No Does the patient have difficulty seeing, even when wearing glasses/contacts?: No Does the patient have difficulty concentrating, remembering, or making decisions?: No Patient able to express need for assistance with ADLs?: Yes Does the patient have difficulty dressing or bathing?: No Independently performs ADLs?: Yes (appropriate for developmental age) Does the patient have difficulty walking or climbing stairs?: No Weakness of Legs: None Weakness of Arms/Hands: None  Permission Sought/Granted Permission sought to share information with : Family Supports Permission granted to share information with : Yes, Verbal Permission Granted  Share Information with NAME: LAWRENCE,MARGART (Spouse)   [phone]           Emotional Assessment Appearance:: Appears stated age Attitude/Demeanor/Rapport: Engaged Affect (typically observed): Stable Orientation: : Oriented to Self,Oriented to Place,Oriented to  Time,Oriented to Situation Alcohol / Substance Use: Not Applicable Psych Involvement: No (comment)  Admission diagnosis:  Acute cholecystitis [K81.0] Abnormal CT scan [R93.89] AKI (acute kidney injury) (HCC) [N17.9] Septic shock (HCC) [A41.9, R65.21] Non-intractable vomiting with nausea, unspecified vomiting type [R11.2] Sepsis with acute renal failure and septic shock, due  to unspecified organism, unspecified acute renal failure type (HCC) [A41.9, R65.21, N17.9] Patient Active Problem List   Diagnosis Date Noted  . Septic shock (HCC) 05/13/2020  . History of sustained ventricular tachycardia 03/03/2019  . Chest pain 03/03/2019  . Chronic systolic CHF (congestive heart failure) (HCC) 03/03/2019  . CAD (coronary artery disease) 03/03/2019  . CHF (congestive heart failure) (HCC) 03/03/2019  . Acute systolic CHF (congestive heart failure) (HCC)   . COPD with acute exacerbation (HCC)   . Other cirrhosis of liver (HCC)   . Chronic hepatitis C without hepatic coma (HCC)   . Hep B w/o coma   . Acute respiratory failure with hypoxia (HCC)   . Acute congestive heart failure (HCC)   . Hyperlipidemia   . Ventricular tachycardia (HCC) 02/28/2019  . Tobacco abuse counseling 02/28/2019  . SOB (shortness of breath) 02/28/2019  . NSTEMI (non-ST elevated myocardial infarction) (HCC) 02/28/2019  . Transaminitis 02/28/2019   PCP:  Patient, No Pcp Per (Inactive) Pharmacy:   [id number] [place], [place] - [address] AT [facility] [address] [place] [address] [place] [address] Phone: [phone] Fax: [phone]  [facility] [address] [place] - [address] [place] [address] Phone: [phone] Fax: [phone]     Social Determinants of Health (SDOH) Interventions    Readmission Risk Interventions No flowsheet data found.

## 2020-05-17 NOTE — Progress Notes (Signed)
1310: Transfer out Report given to [facility]. He is alert and oriented.   1410: Due meds given.  Patient transferred out via wheelchair. He said he will inform his wife about his new room.

## 2020-05-17 NOTE — Discharge Summary (Signed)
Physician Discharge Summary  Patient ID: Brent [name] MRN: [id number] DOB/AGE: 09-13-1960 60 y.o.  Admit date: 05/13/2020 Discharge date: 05/18/2020  Brief Pt Description: 60 y.o male admitted with septic and hypovolemic shock secondary to acute cholecystitis requiring vasopressors s/p  IR guided percutaneous cholecystostomy tube placement 05/14/2020.  Discharge Diagnoses:  Septic & Hypovolemic Shock Acute Cholecystitis Chronic HFrEF without acute exacerbation Acute on Chronic Respiratory Failure secondary to COPD                                                                     DISCHARGE PLAN BY DIAGNOSIS    Sepsis with septic and hypovolemic shock secondary to Acute Cholecystitis>>resolved Chronic HFrEF without acute exacerbation -Resume home Lasix, Entresto, Coreg, & Imdur  -Monitor daily weights -Follow up Appointment with Darylene Price NP at [place] [facility] on May 23, 2020  Acute Cholecystitis s/p IR guided percutaneous cholecystostomy tube placement 05/14/2020. -Gallbladder fluid culture with E. coli (pansensitive) -Has completed 5 day course of Zosyn in hospital, will discharge home with 5 day course of Augmentin -Continue RUQ drain, outpatient follow-up with general surgery to consider cholecystectomy & outpatient follow up with IR to monitor drain  Acute on chronic Respiratory Failure secondary to ACOPD PMHx: Current everyday smoker -Continue home Symbicort -PRN Bronchodilators (Albuterol) for shortness of breath or wheezing                        DISCHARGE SUMMARY   This is a 60 y.o male significant PMH as below who presented to ER on 04/8 with chief complaints of shortness of breath, dizziness, right-sided and epigastric abdominal pain, nausea and vomiting x3 days.  Patient was subsequently admitted with severe sepsis with septic shock secondary to acute cholecystitis requiring vasopressors.  Due to high risk of perioperative complications, patient underwent IR guided  percutaneous cholecystostomy tube placement 05/14/2020.         SIGNIFICANT DIAGNOSTIC STUDIES 05/13/20: Chest x-ray>>Left AICD in place with leads in the right atrium and right ventricle. Heart is normal size. Peribronchial thickening and interstitial prominence. No effusions or acute bony abnormality 05/13/20: CT Abdomen & Pelvis>>1. Marked right upper quadrant inflammation centered on the gallbladder and consistent with acute calculus cholecystitis. Fluid density outpouching extending inferiorly may reflect gangrenous inflammation with breakdown of the wall. Recommend right upper quadrant ultrasound. 2. Prominent inflammation at the hepatic flexure and mid duodenum which is presumed secondary. 05/13/20: RUQ Abdominal Ultrasound>>1. Sonographic findings are consistent with acute calculus cholecystitis in the appropriate clinical setting. 2. Circumscribed 1.6 cm echogenic subcapsular nodule in the right hepatic lobe. Imaging features are most suggestive of a benign hemangioma, however ultrasound is not definitively diagnostic. Consider repeat ultrasound in 6 months to ensure stability.   MICRO DATA  05/13/20: SARS-CoV-2 PCR>> negative 05/13/20: Influenza PCR>> negative 05/13/2020: HIV screen>> nonreactive 05/13/2020: Blood culture x2>> no growth to date /8/22: MRSA PCR>> negative 05/13/2020: Gallbladder fluid>> E. Coli (sensitive to ampicillin, ampicillin/sulbactam, cefazolin, cefepime, ceftazidime, ceftriaxone, ciprofloxacin, gentamicin, imipenem, PIP/taser, trimeta/sulfa)  ANTIBIOTICS 04/08 Cefepime >> x 0 (DCd, changed to Zosyn) 04/08 Vancomycin >> x 1 04/08 Zosyn >>4/13 04/13 Augmentin (plan for 5 day course at home)>>  CONSULTS General Surgery IR  TUBES / LINES Percutaneous cholecystostomy tube placement 4/8>>  Right IJ CVC 4/8>>4/12   Discharge Exam: Constitutional: Acutely ill-appearing male, sitting in bed, on room air, no acute distress Eyes: Pupils PERRLA, no scleral  icterus Ears, nose, mouth, and throat: trachea midline, MMM Cardiovascular: Regular rate and rhythm, S1-S2, no murmurs, rubs, gallops, 2+ pulses throughout Respiratory: Clear to auscultation bilaterally, no wheezing or rales noted, even, nonlabored, normal effort Gastrointestinal: Obese, soft, nontender, nondistended, no guarding or rebound tenderness, bowel sounds positive x4, right upper quadrant drain with bilious output  Skin: Warm and dry.  No obvious rashes, lesions, ulcerations Neurologic: Awake and alert, oriented x4, follows commands (move all 4 extremities) no focal deficits, speech clear Psychiatric: Normal affect   Vitals:   05/17/20 1933 05/18/20 0029 05/18/20 0750 05/18/20 1153  BP: 93/62 (!) 93/55 114/73 97/65  Pulse: 64 63 64 69  Resp: 16 17 14 15   Temp: 97.9 F (36.6 C) 97.7 F (36.5 C) 97.8 F (36.6 C) 98.4 F (36.9 C)  TempSrc:  Oral Oral Oral  SpO2: 98% 94% 94% 98%  Weight:      Height:         Discharge Labs  BMET Recent Labs  Lab 05/13/20 0055 05/13/20 0333 05/14/20 0500 05/15/20 0406 05/16/20 0452 05/17/20 0448  NA 130*  --  137 137 138 137  K 3.6  --  3.8 3.7 3.6 3.9  CL 96*  --  102 102 103 105  CO2 24  --  26 25 26 24   GLUCOSE 152*  --  156* 169* 153* 136*  BUN 45*  --  28* 37* 43* 41*  CREATININE 2.49*  --  1.06 1.09 1.16 1.08  CALCIUM 7.9*  --  8.3* 7.8* 7.6* 7.4*  MG 2.2  --  2.9* 2.6* 2.6* 2.4  PHOS  --  4.2 3.4 3.9 4.6  --     CBC Recent Labs  Lab 05/15/20 0406 05/16/20 0452 05/17/20 0448  HGB 13.3 12.7* 12.2*  HCT 38.1* 37.7* 36.3*  WBC 24.4* 16.1* 17.1*  PLT 354 387 376    Anti-Coagulation Recent Labs  Lab 05/13/20 0625  INR 1.2        Follow-up Information    Sakai, Isami, DO Follow up in 2 week(s).   Specialty: Surgery Why: s/p chole tube placement Contact information: [place] [address] [phone]        [place]. Call.   Why: status post  cholecystostomy tube placement Contact information: [place] [address] [phone]                Allergies as of 05/18/2020   No Known Allergies     Medication List    TAKE these medications   albuterol 108 (90 Base) MCG/ACT inhaler Commonly known as: VENTOLIN HFA Inhale 2 puffs into the lungs every 6 (six) hours as needed.   amiodarone 400 MG tablet Commonly known as: PACERONE Take 1 tablet (400 mg total) by mouth daily.   amoxicillin-clavulanate 875-125 MG tablet Commonly known as: AUGMENTIN Take 1 tablet by mouth every 12 (twelve) hours for 5 days.   aspirin 81 MG EC tablet Take 1 tablet (81 mg total) by mouth daily.   atorvastatin 80 MG tablet Commonly known as: LIPITOR Take 1 tablet (80 mg total) by mouth daily at 6 PM.   budesonide-formoterol 160-4.5 MCG/ACT inhaler Commonly known as: SYMBICORT Inhale 2 puffs into the lungs 2 (two) times daily.   buPROPion 150 MG 24 hr tablet  Commonly known as: WELLBUTRIN XL Take 150 mg by mouth daily.   carvedilol 3.125 MG tablet Commonly known as: COREG Take 1 tablet (3.125 mg total) by mouth 2 (two) times daily with a meal.   cyclobenzaprine 10 MG tablet Commonly known as: FLEXERIL Take 5-10 mg by mouth 2 (two) times daily.   diclofenac Sodium 1 % Gel Commonly known as: VOLTAREN Apply 2 g topically 4 (four) times daily as needed.   empagliflozin 10 MG Tabs tablet Commonly known as: Jardiance Take 1 tablet (10 mg total) by mouth daily before breakfast.   gabapentin 300 MG capsule Commonly known as: NEURONTIN Take 300 mg by mouth 2 (two) times daily.   isosorbide mononitrate 30 MG 24 hr tablet Commonly known as: IMDUR Take 30 mg by mouth daily.   naproxen sodium 220 MG tablet Commonly known as: ALEVE Take 220 mg by mouth daily as needed.   nicotine 21 mg/24hr patch Commonly known as: NICODERM CQ - dosed in mg/24 hours Place 1 patch (21 mg total) onto the skin daily.    nitroGLYCERIN 0.4 MG SL tablet Commonly known as: NITROSTAT Place 1 tablet (0.4 mg total) under the tongue every 5 (five) minutes x 3 doses as needed for chest pain.   oxyCODONE 15 MG immediate release tablet Commonly known as: ROXICODONE Take 1 tablet (15 mg total) by mouth every 4 (four) hours as needed for moderate pain.   sacubitril-valsartan 97-103 MG Commonly known as: ENTRESTO Take 1 tablet by mouth 2 (two) times daily.   sodium chloride flush 0.9 % Soln injection Inject 5 mLs into the vein as needed.   spironolactone 25 MG tablet Commonly known as: ALDACTONE Take 1 tablet (25 mg total) by mouth daily.         Disposition: Home with home health Physical Therapy  Discharged Condition: Brent [name] has met maximum benefit of inpatient care and is medically stable and cleared for discharge.  Patient is pending follow up as above.      Time spent on disposition:  50 Minutes.   Signed: Darel Hong, AGACNP-BC [facility] Prefer epic messenger for cross cover needs If after hours, please call E-link

## 2020-05-17 NOTE — Progress Notes (Signed)
Physical Therapy Treatment Patient Details Name: Brent [name] MRN: [id number] DOB: October 19, 1960 Today's Date: 05/17/2020    History of Present Illness Pt is a 60 y/o M admitted on 05/13/20 with c/c of abdominal pain & SOB. Pt admitted for tx of severe sepsis with septic shock 2/2 acute cholecystitis requiring vasopressors. Pt underwent IR guided percutaneous cholecystostomy tube placement on 05/14/20. PMH: SAH, spinal stenosis, HTN, HLD, Hep C, depression, COPD, chronic pain, chronic systolic CHF EF 25-30%, ventricular tachycardia requiring ICD    PT Comments    Patient agreeable to PT. Patient reports he is eager to be discharged home. Patient ambulated around nursing station and in room with occasional unsteadiness that is self corrected, Min guard for safety. Patient needs intermittent cues for safety with mobility. Encouraged patient to use assistive device with longer distance ambulation for fall prevention, although he declined during this session. Patient educated on progressing activity and routine mobility at home for upright conditioning. Patient is generally deconditioned and would benefit from ongoing PT at discharge. Recommend HHPT, and patient reports he will have assistance from his significant other at home.     Follow Up Recommendations  Home health PT     Equipment Recommendations  Rolling walker with 5" wheels    Recommendations for Other Services       Precautions / Restrictions Precautions Precautions: Fall Restrictions Weight Bearing Restrictions: No    Mobility  Bed Mobility Overal bed mobility: Modified Independent                  Transfers Overall transfer level: Needs assistance Equipment used: None Transfers: Sit to/from Stand Sit to Stand: Supervision         General transfer comment: supervision for safety.  Ambulation/Gait Ambulation/Gait assistance: Min guard Gait Distance (Feet): 200 Feet Assistive device: None Gait Pattern/deviations:  Decreased step length - right;Decreased weight shift to right Gait velocity: decreased   General Gait Details: Patient ambulating around the nursing station and in the room with Min guard for safety. Patient has bouts of mild unsteadiness that is self corrected. Patient would likely benefit from using rolling walker for ambulation for safety and fall prevention, however he declined. Patient reports feeling mild shortness of breath with Sp02 91% on room air, heart rate 80bpm after walking. educated patient on gradually progressing activity and walking at home and using assistive device for longer distance ambulation   Stairs             Wheelchair Mobility    Modified Rankin (Stroke Patients Only)       Balance   Sitting-balance support: Feet supported Sitting balance-Leahy Scale: Good     Standing balance support: No upper extremity supported Standing balance-Leahy Scale: Fair                              Cognition Arousal/Alertness: Awake/alert Behavior During Therapy: WFL for tasks assessed/performed Overall Cognitive Status: Within Functional Limits for tasks assessed                                        Exercises      General Comments        Pertinent Vitals/Pain Pain Assessment: No/denies pain    Home Living  Prior Function            PT Goals (current goals can now be found in the care plan section) Acute Rehab PT Goals Patient Stated Goal: to go home PT Goal Formulation: With patient Time For Goal Achievement: 05/30/20 Potential to Achieve Goals: Good Progress towards PT goals: Progressing toward goals    Frequency    Min 2X/week      PT Plan Current plan remains appropriate    Co-evaluation              AM-PAC PT "6 Clicks" Mobility   Outcome Measure  Help needed turning from your back to your side while in a flat bed without using bedrails?: None Help needed moving  from lying on your back to sitting on the side of a flat bed without using bedrails?: None Help needed moving to and from a bed to a chair (including a wheelchair)?: A Little Help needed standing up from a chair using your arms (e.g., wheelchair or bedside chair)?: A Little Help needed to walk in hospital room?: A Little Help needed climbing 3-5 steps with a railing? : A Little 6 Click Score: 20    End of Session   Activity Tolerance: Patient tolerated treatment well Patient left: in bed;with call bell/phone within reach;with bed alarm set;with family/visitor present Nurse Communication: Mobility status PT Visit Diagnosis: Unsteadiness on feet (R26.81);Muscle weakness (generalized) (M62.81)     Time: 4158-3094 PT Time Calculation (min) (ACUTE ONLY): 17 min  Charges:  $Therapeutic Activity: 8-22 mins                     Donna Bernard, PT, MPT    Ina Homes 05/17/2020, 4:18 PM

## 2020-05-17 NOTE — Progress Notes (Signed)
NAME:  Brent [name], MRN:  [id number], DOB:  1960/03/01, LOS: 4 ADMISSION DATE:  05/13/2020, CONSULTATION DATE: 05/13/20 REFERRING MD: Dr. Don Perking, CHIEF COMPLAINT:  Abdominal pain and shortness of breath  Brief History of Present Illness:  This is a 60 y.o male significant PMH as below who presented to ER on 04/8 with chief complaints of shortness of breath, dizziness, right-sided and epigastric abdominal pain, nausea and vomiting x3 days.  Patient was subsequently admitted with severe sepsis with septic shock secondary to acute cholecystitis requiring vasopressors.  Due to high risk of perioperative complications, patient underwent IR guided percutaneous cholecystostomy tube placement 05/14/2020.  Pertinent  Medical History  Subarachnoid Hemorrhage Spinal Stenosis HTN HLD Hepatitis C Depression COPD Chronic Pain  Chronic Systolic CHF~EF 25 to 30% via Echo 03/01/2019 Ventricular Tachycardia requiring ICD   Significant Hospital Events: Including procedures, antibiotic start and stop dates in addition to other pertinent events   4/08: Patient admitted to ICU with hypovolemia and septic shock requiring Levophed gtt 4/08: s/p right IJ CVC placement 4/08: s/p IR guided percutaneous cholecystotomy tube placement 4/12: Remains off pressors, hemodynamically stable, ok by Surgery for discharge, will d/c midodrine and stress dose steroids; Gallbladder fluid culture with E.coli, pan sensitive  Cultures:  4/08: SARS-CoV-2 PCR>>negative 4/8: Influenza PCR>>negative 4/8: HIV screen>>nonreactive 4/08: Blood cultures>> No growth to date 4/08: MRSA PCR>>negative 4/08: Gallbladder>> ESCHERICHIA COLI   Antimicrobials:  04/08 Cefepime >> x 0 (DCd, changed to Zosyn) 04/08 Vancomycin >> x 1 04/08 Zosyn >>   Consults  General Surgery IR  Interim History / Subjective:  -Gallbladder fluid culture with E.coli, pan sensitive -Levophed weaned off, will d/c Midodrine and Solu-Cortef, if BP tolerates  can likely d/c home tomorrow -Will d/c central line and move out of ICU today -Denies all complaints -Afebrile, hemodynamically stable, on room air -Urine output 1.2L last 24 hrs (-4.5L since admit)  Objective   Blood pressure 137/84, pulse 60, temperature 98.1 F (36.7 C), temperature source Oral, resp. rate 17, height 6' (1.829 m), weight 94.1 kg, SpO2 97 %. CVP:  [5 mmHg-9 mmHg] 5 mmHg      Intake/Output Summary (Last 24 hours) at 05/17/2020 0936 Last data filed at 05/17/2020 0600 Gross per 24 hour  Intake 1382.87 ml  Output 1575 ml  Net -192.13 ml   Filed Weights   05/15/20 0500 05/16/20 0500 05/17/20 0329  Weight: 91 kg 94 kg 94.1 kg    Physical Examination: Constitutional: Acutely ill-appearing male, sitting in bed, on room air, no acute distress Eyes: Pupils PERRLA, no scleral icterus Ears, nose, mouth, and throat: trachea midline, MMM Cardiovascular: Regular rate and rhythm, S1-S2, no murmurs, rubs, gallops, 2+ pulses throughout Respiratory: Clear to auscultation bilaterally, no wheezing or rales noted, even, nonlabored, normal effort Gastrointestinal: Obese, soft, nontender, nondistended, no guarding or rebound tenderness, bowel sounds positive x4, right upper quadrant drain with bilious output  Skin: Warm and dry.  No obvious rashes, lesions, ulcerations Neurologic: Awake and alert, oriented x4, follows commands (move all 4 extremities) no focal deficits, speech clear Psychiatric: Normal affect    Labs/imaging that I havepersonally reviewed  (right click and "Reselect all SmartList Selections" daily)  Labs 05/17/20: Glucose 136, BUN 41, creatinine 1.08, WBC 17.1, hemoglobin 12.2 Gallbladder fluid culture with E. coli (pansensitive)  Resolved Hospital Problem list   Hyponatremia   Assessment & Plan:  60 y.o male admitted with septic and hypovolemic shock secondary to acute cholecystitis requiring vasopressors s/p  IR guided percutaneous cholecystostomy tube  placement 05/14/2020.   Sepsis with septic and hypovolemic shock secondary to Acute Cholecystitis Chronic HFrEF without acute exacerbation -Continuous cardiac monitoring -Maintain MAP >65 -Cautious IV fluids given CHF history -Has been off Vasopressors x24 hrs -Discontinue Midodrine and Stress dose steroids 05/17/20 -Holding Lasix, Entresto, Coreg, & Imdur for now, likely can resume tomorrow pending response to discontinuation of midodrine and steroids  Acute Cholecystitis s/p IR guided percutaneous cholecystostomy tube placement 05/14/2020. -Monitor fever curve -Trend WBCs -Gallbladder fluid culture with E. coli (pansensitive) -Continue RUQ drain, outpatient follow-up with general surgery to consider cholecystectomy -Continue Zosyn for now (7 days reasonable), will discharge home on Augmentin - Start bowel regimen if constipated  Acute on chronic Respiratory Failure secondary to ACOPD PMHx: Current everyday smoker -Supplemental O2 as needed to maintain O2 sats 88 to 94% -Follow intermittent chest x-ray and ABG as needed -Continue dulera in lieu of PTA symbicort -PRN Bronchodilators   Best practice (right click and "Reselect all SmartList Selections" daily)  Diet:  Oral Pain/Anxiety/Delirium protocol (if indicated): No VAP protocol (if indicated): Not indicated DVT prophylaxis: Subcutaneous Heparin GI prophylaxis: H2B Glucose control:  SSI Yes Central venous access:  Will d/c today 05/17/20 Arterial line:  N/A Foley:  N/A Mobility:  bed rest  PT consulted: N/A Last date of multidisciplinary goals of care discussion [05/17/20] Code Status:  full code Disposition: Med-surg     Harlon Ditty, AGACNP-BC  Pulmonary & Critical Care Medicine Prefer epic messenger for cross cover needs If after hours, please call E-link

## 2020-05-17 NOTE — Progress Notes (Signed)
05/17/2020   I have seen and evaluated the patient for cholecystitis.  S:  Off pressors. Starting to mobilize. No BM but only goes once a week due to home opiate use.  O: Blood pressure 122/79, pulse 64, temperature 98.1 F (36.7 C), temperature source Oral, resp. rate 15, height 6' (1.829 m), weight 94.1 kg, SpO2 100 %.  No acute distress RUQ drain with ongoing bilious output Ext without edema  A:  Septic shock secondary to cholecystitis resolved after GB drain placement Hx CHF not overloaded  P:  - DC central line - Continue zosyn - Stop stress steroids and midodrine - Stable for floor and likely home tomorrow, will dc abx on DC - Will need f/u with surgery for drain evaluation and consideration of cholecystectomy   Myrla Halsted MD [facility] Prefer epic messenger for cross cover needs If after hours, please call E-link

## 2020-05-18 DIAGNOSIS — A419 Sepsis, unspecified organism: Secondary | ICD-10-CM

## 2020-05-18 LAB — CULTURE, BLOOD (ROUTINE X 2)
Culture: NO GROWTH
Culture: NO GROWTH
Special Requests: ADEQUATE
Special Requests: ADEQUATE

## 2020-05-18 MED ORDER — AMOXICILLIN-POT CLAVULANATE 875-125 MG PO TABS: 1.0000 | ORAL_TABLET | Freq: Two times a day (BID) | ORAL | 0 refills | Status: AC

## 2020-05-18 MED ORDER — AMOXICILLIN-POT CLAVULANATE 875-125 MG PO TABS
1.0000 | ORAL_TABLET | Freq: Two times a day (BID) | ORAL | Status: DC
  Administered 2020-05-18: 1 via ORAL
  Filled 2020-05-18: qty 1

## 2020-05-18 NOTE — Progress Notes (Signed)
Pt and family verbalized understanding of all discharge instructions. Pt and family was instructed how to flush tube via visual demonstration and verbalized understanding. Pt denies any other additional needs at this time and NAD noted prior to discharge. Pt transported for discharge via wheelchair at this time.

## 2020-05-18 NOTE — TOC Transition Note (Signed)
Transition of Care [facility]) - CM/SW Discharge Note   Patient Details  Name: Brent [name] MRN: [id number] Date of Birth: 1960-11-18  Transition of Care [facility] CM/SW Contact:  Maree Krabbe, LCSW Phone Number: 05/18/2020, 1:32 PM   Clinical Narrative:   CSW unable to find a [facility] agency to service pt. CSW spoke with pt's spouse and she is agreeable to outpatient PT for pt. Prefers [place] location--referral will be made.    Final next level of care: Home/Self Care Barriers to Discharge: No Barriers Identified   Patient Goals and CMS Choice Patient states their goals for this hospitalization and ongoing recovery are:: To get back home soon.      Discharge Placement                Patient to be transferred to facility by: spouse Name of family member notified: margarat Patient and family notified of of transfer: 05/18/20  Discharge Plan and Services In-house Referral: Clinical Social Work   Post Acute Care Choice: Home Health                    HH Arranged: PT,OT          Social Determinants of Health (SDOH) Interventions     Readmission Risk Interventions No flowsheet data found.

## 2020-05-18 NOTE — Progress Notes (Signed)
NAME:  Brent [name], MRN:  [id number], DOB:  Nov 29, 1960, LOS: 5 ADMISSION DATE:  05/13/2020, CONSULTATION DATE: 05/13/20 REFERRING MD: Dr. Don Perking, CHIEF COMPLAINT:  Abdominal pain and shortness of breath  Brief History of Present Illness:  This is a 60 y.o male significant PMH as below who presented to ER on 04/8 with chief complaints of shortness of breath, dizziness, right-sided and epigastric abdominal pain, nausea and vomiting x3 days.  Patient was subsequently admitted with severe sepsis with septic shock secondary to acute cholecystitis requiring vasopressors.  Due to high risk of perioperative complications, patient underwent IR guided percutaneous cholecystostomy tube placement 05/14/2020.  Pertinent  Medical History  Subarachnoid Hemorrhage Spinal Stenosis HTN HLD Hepatitis C Depression COPD Chronic Pain  Chronic Systolic CHF~EF 25 to 30% via Echo 03/01/2019 Ventricular Tachycardia requiring ICD   Significant Hospital Events: Including procedures, antibiotic start and stop dates in addition to other pertinent events   4/08: Patient admitted to ICU with hypovolemia and septic shock requiring Levophed gtt 4/08: s/p right IJ CVC placement 4/08: s/p IR guided percutaneous cholecystotomy tube placement 4/12: Remains off pressors, hemodynamically stable, ok by Surgery for discharge, will d/c midodrine and stress dose steroids; Gallbladder fluid culture with E.coli, pan sensitive 4/13: Transferred to Med-surg, no events overnight, worked with PT without complications, plan for discharge home today  Cultures:  4/08: SARS-CoV-2 PCR>>negative 4/8: Influenza PCR>>negative 4/8: HIV screen>>nonreactive 4/08: Blood cultures>> No growth to date 4/08: MRSA PCR>>negative 4/08: Gallbladder>> ESCHERICHIA COLI   Antimicrobials:  04/08 Cefepime >> x 0 (DCd, changed to Zosyn) 04/08 Vancomycin >> x 1 04/08 Zosyn >>4/13 04/13 Augmentin>>   Consults  General Surgery IR  Interim History /  Subjective:  No acute events noted overnight Afebrile, hemodynamically stable, on room air Worked with PT this morning, no complications ~ to be discharged home with home PT 375 ml of biliary drainage from tube last 24 hrs Will discharge home this afternoon with 5 day course of Augmentin   Objective   Blood pressure 114/73, pulse 64, temperature 97.8 F (36.6 C), temperature source Oral, resp. rate 14, height 6' (1.829 m), weight 94.1 kg, SpO2 94 %.        Intake/Output Summary (Last 24 hours) at 05/18/2020 1145 Last data filed at 05/18/2020 1019 Gross per 24 hour  Intake 629.3 ml  Output 375 ml  Net 254.3 ml   Filed Weights   05/15/20 0500 05/16/20 0500 05/17/20 0329  Weight: 91 kg 94 kg 94.1 kg    Physical Examination: Constitutional: Acutely ill-appearing male, sitting in recliner, on room air, no acute distress Eyes: Pupils PERRLA, no scleral icterus Ears, nose, mouth, and throat: trachea midline, MMM Cardiovascular: Regular rate and rhythm, S1-S2, no murmurs, rubs, gallops, 2+ pulses throughout Respiratory: Clear to auscultation bilaterally, no wheezing or rales noted, even, nonlabored, normal effort Gastrointestinal: Obese, soft, nontender, nondistended, no guarding or rebound tenderness, bowel sounds positive x4, right upper quadrant drain with bilious output  Skin: Warm and dry.  No obvious rashes, lesions, ulcerations Neurologic: Awake and alert, oriented x4, follows commands (move all 4 extremities) no focal deficits, speech clear Psychiatric: Normal affect    Labs/imaging that I havepersonally reviewed  (right click and "Reselect all SmartList Selections" daily)  Labs 05/17/20: Glucose 136, BUN 41, creatinine 1.08, WBC 17.1, hemoglobin 12.2 Gallbladder fluid culture with E. coli (pansensitive)  Resolved Hospital Problem list   Hyponatremia Septic shock   Assessment & Plan:  60 y.o male admitted with septic and hypovolemic shock  secondary to acute  cholecystitis requiring vasopressors s/p  IR guided percutaneous cholecystostomy tube placement 05/14/2020.   Sepsis with septic and hypovolemic shock secondary to Acute Cholecystitis>>resolved Chronic HFrEF without acute exacerbation -Continuous cardiac monitoring -Maintain MAP >65 -Weaned off vasopressors -Will resume home Lasix, Entresto, Coreg, & Imdur   Acute Cholecystitis s/p IR guided percutaneous cholecystostomy tube placement 05/14/2020. -Monitor fever curve -Trend WBCs -Gallbladder fluid culture with E. coli (pansensitive) -Continue RUQ drain, outpatient follow-up with general surgery to consider cholecystectomy -Has completed 5 day course of Zosyn here in hospital, will discharge home on 5 day course of Augmentin  Acute on chronic Respiratory Failure secondary to ACOPD PMHx: Current everyday smoker -Supplemental O2 as needed to maintain O2 sats 88 to 94% -Follow intermittent chest x-ray and ABG as needed -Continue dulera in lieu of PTA symbicort -PRN Bronchodilators   Best practice (right click and "Reselect all SmartList Selections" daily)  Diet:  Oral Pain/Anxiety/Delirium protocol (if indicated): No VAP protocol (if indicated): Not indicated DVT prophylaxis: Subcutaneous Heparin GI prophylaxis: H2B Glucose control:  SSI Yes Central venous access:  Will d/c today 05/18/20 Arterial line:  N/A Foley:  N/A Mobility:  OOB PT consulted: N/A Last date of multidisciplinary goals of care discussion [N/A] Code Status:  full code Disposition: Med-surg     Harlon Ditty, AGACNP-BC [facility] Prefer epic messenger for cross cover needs If after hours, please call E-link

## 2020-05-18 NOTE — Progress Notes (Signed)
Physical Therapy Treatment Patient Details Name: Brent [name] MRN: [id number] DOB: Nov 21, 1960 Today's Date: 05/18/2020    History of Present Illness Pt is a 60 y/o M admitted on 05/13/20 with c/c of abdominal pain & SOB. Pt admitted for tx of severe sepsis with septic shock 2/2 acute cholecystitis requiring vasopressors. Pt underwent IR guided percutaneous cholecystostomy tube placement on 05/14/20. PMH: SAH, spinal stenosis, HTN, HLD, Hep C, depression, COPD, chronic pain, chronic systolic CHF EF 25-30%, ventricular tachycardia requiring ICD    PT Comments    Feeling well, stated he is hoping to go home today.  OOB and able to complete 1 lap around unit with no AD and supervision.  Overall does well but gait remains slightly unsteady which does seem to increase with fatigue but no LOB's that need intervention.  He stated he does not feel he needs and AD at this time.  Has SPC at home but does not plan to use it.  Education provided.   Follow Up Recommendations  Home health PT     Equipment Recommendations  Rolling walker with 5" wheels    Recommendations for Other Services       Precautions / Restrictions Precautions Precautions: Fall Restrictions Weight Bearing Restrictions: No Other Position/Activity Restrictions: ICD precautions    Mobility  Bed Mobility Overal bed mobility: Modified Independent Bed Mobility: Supine to Sit     Supine to sit: Modified independent (Device/Increase time);HOB elevated          Transfers Overall transfer level: Needs assistance Equipment used: None Transfers: Sit to/from Stand Sit to Stand: Supervision         General transfer comment: supervision for safety.  Ambulation/Gait Ambulation/Gait assistance: Min Emergency planning/management officer (Feet): 200 Feet             Stairs             Wheelchair Mobility    Modified Rankin (Stroke Patients Only)       Balance   Sitting-balance support: Feet supported Sitting  balance-Leahy Scale: Good     Standing balance support: No upper extremity supported Standing balance-Leahy Scale: Fair                              Cognition Arousal/Alertness: Awake/alert Behavior During Therapy: WFL for tasks assessed/performed Overall Cognitive Status: Within Functional Limits for tasks assessed                                        Exercises      General Comments        Pertinent Vitals/Pain Pain Assessment: No/denies pain    Home Living                      Prior Function            PT Goals (current goals can now be found in the care plan section) Progress towards PT goals: Progressing toward goals    Frequency    Min 2X/week      PT Plan Current plan remains appropriate    Co-evaluation              AM-PAC PT "6 Clicks" Mobility   Outcome Measure  Help needed turning from your back to your side while in a flat bed without using bedrails?: None Help needed  moving from lying on your back to sitting on the side of a flat bed without using bedrails?: None Help needed moving to and from a bed to a chair (including a wheelchair)?: A Little Help needed standing up from a chair using your arms (e.g., wheelchair or bedside chair)?: A Little Help needed to walk in hospital room?: A Little Help needed climbing 3-5 steps with a railing? : A Little 6 Click Score: 20    End of Session   Activity Tolerance: Patient tolerated treatment well Patient left: in bed;with call bell/phone within reach;with bed alarm set;with family/visitor present Nurse Communication: Mobility status PT Visit Diagnosis: Unsteadiness on feet (R26.81);Muscle weakness (generalized) (M62.81)     Time: 4585-9292 PT Time Calculation (min) (ACUTE ONLY): 8 min  Charges:  $Gait Training: 8-22 mins                    Danielle Dess, PTA 05/18/20, 10:30 AM

## 2020-05-19 LAB — AEROBIC/ANAEROBIC CULTURE W GRAM STAIN (SURGICAL/DEEP WOUND)
Gram Stain: NONE SEEN
Special Requests: NORMAL

## 2020-05-23 ENCOUNTER — Encounter: Payer: Self-pay | Admitting: Family

## 2020-05-23 ENCOUNTER — Ambulatory Visit: Payer: Medicaid Other | Attending: Family | Admitting: Family

## 2020-05-23 ENCOUNTER — Other Ambulatory Visit: Payer: Self-pay

## 2020-05-23 VITALS — BP 119/85 | HR 88 | Resp 18 | Ht 72.0 in | Wt 203.0 lb

## 2020-05-23 DIAGNOSIS — Z8619 Personal history of other infectious and parasitic diseases: Secondary | ICD-10-CM | POA: Diagnosis not present

## 2020-05-23 DIAGNOSIS — I110 Hypertensive heart disease with heart failure: Secondary | ICD-10-CM | POA: Diagnosis not present

## 2020-05-23 DIAGNOSIS — Z7901 Long term (current) use of anticoagulants: Secondary | ICD-10-CM | POA: Diagnosis not present

## 2020-05-23 DIAGNOSIS — Z7982 Long term (current) use of aspirin: Secondary | ICD-10-CM | POA: Diagnosis not present

## 2020-05-23 DIAGNOSIS — E785 Hyperlipidemia, unspecified: Secondary | ICD-10-CM | POA: Insufficient documentation

## 2020-05-23 DIAGNOSIS — I472 Ventricular tachycardia, unspecified: Secondary | ICD-10-CM

## 2020-05-23 DIAGNOSIS — R200 Anesthesia of skin: Secondary | ICD-10-CM | POA: Insufficient documentation

## 2020-05-23 DIAGNOSIS — Z7951 Long term (current) use of inhaled steroids: Secondary | ICD-10-CM | POA: Diagnosis not present

## 2020-05-23 DIAGNOSIS — I10 Essential (primary) hypertension: Secondary | ICD-10-CM

## 2020-05-23 DIAGNOSIS — J449 Chronic obstructive pulmonary disease, unspecified: Secondary | ICD-10-CM

## 2020-05-23 DIAGNOSIS — F17210 Nicotine dependence, cigarettes, uncomplicated: Secondary | ICD-10-CM | POA: Diagnosis not present

## 2020-05-23 DIAGNOSIS — F32A Depression, unspecified: Secondary | ICD-10-CM | POA: Insufficient documentation

## 2020-05-23 DIAGNOSIS — I509 Heart failure, unspecified: Secondary | ICD-10-CM | POA: Diagnosis not present

## 2020-05-23 DIAGNOSIS — B182 Chronic viral hepatitis C: Secondary | ICD-10-CM

## 2020-05-23 DIAGNOSIS — R5383 Other fatigue: Secondary | ICD-10-CM | POA: Diagnosis present

## 2020-05-23 DIAGNOSIS — R531 Weakness: Secondary | ICD-10-CM | POA: Insufficient documentation

## 2020-05-23 DIAGNOSIS — R0602 Shortness of breath: Secondary | ICD-10-CM | POA: Insufficient documentation

## 2020-05-23 DIAGNOSIS — Z79899 Other long term (current) drug therapy: Secondary | ICD-10-CM | POA: Insufficient documentation

## 2020-05-23 DIAGNOSIS — K819 Cholecystitis, unspecified: Secondary | ICD-10-CM

## 2020-05-23 DIAGNOSIS — I5022 Chronic systolic (congestive) heart failure: Secondary | ICD-10-CM

## 2020-05-23 NOTE — Progress Notes (Signed)
[facility] - HEART FAILURE CLINIC - PHARMACIST COUNSELING NOTE  ADHERENCE ASSESSMENT  Adherence strategy: pill box   Do you ever forget to take your medication? [] Yes (1) [x] No (0)  Do you ever skip doses due to side effects? [] Yes (1) [x] No (0)  Do you have trouble affording your medicines? [] Yes (1) [x] No (0)  Are you ever unable to pick up your medication due to transportation difficulties? [] Yes (1) [x] No (0)  Do you ever stop taking your medications because you don't believe they are helping? [] Yes (1) [x] No (0)  Total score _0______    Recommendations given to patient about increasing adherence: N/A  Guideline-Directed Medical Therapy/Evidence Based Medicine  ACE/ARB/ARNI: Entresto 97-103 mg twice daily Beta Blocker: carvedilol 3.125 mg twice daily  Aldosterone Antagonist: spironolactone 25 mg daily Diuretic: N/A    SUBJECTIVE  HPI:  Past Medical History:  Diagnosis Date   CHF (congestive heart failure) (HCC)    Chronic pain    COPD (chronic obstructive pulmonary disease) (HCC)    Depression    Hepatitis C    Hyperlipidemia    Hypertension    Spinal stenosis    Subarachnoid hemorrhage (HCC)         OBJECTIVE   Vital signs: HR 88, BP 119/85, weight 92.1 kg ECHO: Date 03/01/19, EF 25-30% Cath: Date 03/04/19, EF 25%  BMP Latest Ref Rng & Units 05/17/2020 05/16/2020 05/15/2020  Glucose 70 - 99 mg/dL ) ) )  BUN 6 - 20 mg/dL ) ) 03/03/19)  Creatinine 0.61 - 1.24 mg/dL 03/06/19 07/17/2020 07/16/2020  Sodium 135 - 145 mmol/L 137 138 137  Potassium 3.Brent - Brent.1 mmol/L 3.9 3.6 3.7  Chloride 98 - 111 mmol/L 105 103 102  CO2 22 - 32 mmol/L 24 26 25   Calcium 8.9 - 10.3 mg/dL 7.4(L) 7.6(L) 7.8(L)    ASSESSMENT 60 yo [name] presenting for heart failure clinic follow-up. Pt was recently admitted for cholecystitis with tube placed. PMH includes subarachnoid hemorrhage, HTN, HLD, hepatitis C, depression, COPD, chronic pain, and CHF. Pt brought  medication bottles in which were reviewed and medication list updated. No barriers to adherence identified through medication reconciliation. Pt mentioned BP was low during recent admission thought to be due to cholecystitis. Pt is monitoring BP frequently. BP today 119/85 in clinic. Pt has been working on smoking cessation. Congratulated pt on making it 31 days without smoking.  PLAN CHF/HTN - Continue carvedilol 3.125 mg twice daily  - Continue empagliflozin 10 mg daily  - Continue Entresto 97-103 mg twice daily  - Continue spironolactone 25 mg daily   Chest pain - Continue isosorbide mononitrate 30 mg daily   Cholecystitis - Continue Augmentin 875-125 mg every 12 hours x 1 more day  - Continue sodium chloride flush for tube as needed  HLD - Continue atorvastatin 80 mg daily   Tachycardia - Continue amiodarone 400 mg daily   Depression - Continue bupropion XL 150 mg daily  COPD - Continue albuterol inhaler 2 puffs every 6 hours as needed - Continue Symbicort inhaler 2 puffs twice daily  Chronic pain - Continue oxycodone 15 mg every 4 hours as needed - Continue diclofenac gel four times daily as needed - Continue gabapentin 300 mg twice daily  - Continue naproxen 220 mg daily as needed (pt reports rare use)  Smoking cessation - Continue current smoking cessation therapy with nicotine 21 mg daily  Other - Continue aspirin 81 mg daily - Continue cyclobenzaprine Brent-10 mg twice daily as  needed - Continue multivitamin daily   Time spent: 10 minutes  Reatha Armour, PharmD Pharmacy Resident  05/23/2020 12:23 PM    Current Outpatient Medications:    albuterol (VENTOLIN HFA) 108 (90 Base) MCG/ACT inhaler, Inhale 2 puffs into the lungs every 6 (six) hours as needed., Disp: , Rfl:    amiodarone (PACERONE) 400 MG tablet, Take 1 tablet (400 mg total) by mouth daily., Disp: 90 tablet, Rfl: 3   amoxicillin-clavulanate (AUGMENTIN) 875-125 MG tablet, Take 1 tablet by mouth  every 12 (twelve) hours for Brent days., Disp: 10 tablet, Rfl: 0   aspirin EC 81 MG EC tablet, Take 1 tablet (81 mg total) by mouth daily., Disp:  , Rfl:    atorvastatin (LIPITOR) 80 MG tablet, Take 1 tablet (80 mg total) by mouth daily at 6 PM., Disp: 90 tablet, Rfl: 3   budesonide-formoterol (SYMBICORT) 160-4.Brent MCG/ACT inhaler, Inhale 2 puffs into the lungs 2 (two) times daily., Disp: , Rfl:    buPROPion (WELLBUTRIN XL) 150 MG 24 hr tablet, Take 150 mg by mouth daily., Disp: , Rfl:    carvedilol (COREG) 3.125 MG tablet, Take 1 tablet (3.125 mg total) by mouth 2 (two) times daily with a meal., Disp: 180 tablet, Rfl: 3   cyclobenzaprine (FLEXERIL) 10 MG tablet, Take Brent-10 mg by mouth 2 (two) times daily., Disp: , Rfl:    diclofenac Sodium (VOLTAREN) 1 % GEL, Apply 2 g topically 4 (four) times daily as needed., Disp: , Rfl:    empagliflozin (JARDIANCE) 10 MG TABS tablet, Take 1 tablet (10 mg total) by mouth daily before breakfast., Disp: 90 tablet, Rfl: 3   gabapentin (NEURONTIN) 300 MG capsule, Take 300 mg by mouth 2 (two) times daily., Disp: , Rfl:    isosorbide mononitrate (IMDUR) 30 MG 24 hr tablet, Take 30 mg by mouth daily., Disp: , Rfl:    Multiple Vitamin (MULTIVITAMIN WITH MINERALS) TABS tablet, Take 1 tablet by mouth daily., Disp: , Rfl:    naproxen sodium (ALEVE) 220 MG tablet, Take 220 mg by mouth daily as needed., Disp: , Rfl:    nicotine (NICODERM CQ - DOSED IN MG/24 HOURS) 21 mg/24hr patch, Place 1 patch (21 mg total) onto the skin daily., Disp: 28 patch, Rfl: Brent   nitroGLYCERIN (NITROSTAT) 0.4 MG SL tablet, Place 1 tablet (0.4 mg total) under the tongue every Brent (five) minutes x 3 doses as needed for chest pain. (Patient not taking: Reported on 05/23/2020), Disp:  , Rfl: 12   oxyCODONE (ROXICODONE) 15 MG immediate release tablet, Take 1 tablet (15 mg total) by mouth every 4 (four) hours as needed for moderate pain., Disp: 30 tablet, Rfl: 0   sacubitril-valsartan (ENTRESTO) 97-103 MG, Take 1  tablet by mouth 2 (two) times daily., Disp: 180 tablet, Rfl: 3   sodium chloride flush 0.9 % SOLN injection, Inject Brent mLs into the vein as needed., Disp: 450 mL, Rfl: 3   spironolactone (ALDACTONE) 25 MG tablet, Take 1 tablet (25 mg total) by mouth daily., Disp: 90 tablet, Rfl: 3   COUNSELING POINTS/CLINICAL PEARLS  Carvedilol (Goal: weight less than 85 kg is 25 mg BID, weight greater than 85 kg is 50 mg BID)  Patient should avoid activities requiring coordination until drug effects are realized, as drug may cause dizziness.  This drug may cause diarrhea, nausea, vomiting, arthralgia, back pain, myalgia, headache, vision disorder, erectile dysfunction, reduced libido, or fatigue.  Instruct patient to report signs/symptoms of adverse cardiovascular effects such as hypotension (  especially in elderly patients), arrhythmias, syncope, palpitations, angina, or edema.  Drug may mask symptoms of hypoglycemia. Advise diabetic patients to carefully monitor blood sugar levels.  Patient should take drug with food.  Advise patient against sudden discontinuation of drug. Entresto (Goal: 97/103 mg twice daily)  Warn male patient to avoid pregnancy during therapy and to report a pregnancy to a physician.  Advise patient to report symptomatic hypotension.  Side effects may include hyperkalemia, cough, dizziness, or renal failure. Spironolactone  Warn patient to report dehydration, hypotension, or symptoms of worsening renal function.  Counsel male patient to report gynecomastia.  Side effects may include diarrhea, nausea, vomiting, abdominal cramping, fever, leg cramps, lethargy, mental confusion, decreased libido, irregular menses, and rash. Suspension: Tell patient to take drug consistently with respect to food, either before or after a meal.  Advise patient to avoid potassium supplements and foods containing high levels of potassium, including salt substitutes.  DRUGS TO AVOID IN HEART FAILURE  Drug or  Class Mechanism  Analgesics NSAIDs COX-2 inhibitors Glucocorticoids  Sodium and water retention, increased systemic vascular resistance, decreased response to diuretics   Diabetes Medications Metformin Thiazolidinediones Rosiglitazone (Avandia) Pioglitazone (Actos) DPP4 Inhibitors Saxagliptin (Onglyza) Sitagliptin (Januvia)   Lactic acidosis Possible calcium channel blockade   Unknown  Antiarrhythmics Class I  Flecainide Disopyramide Class III Sotalol Other Dronedarone  Negative inotrope, proarrhythmic   Proarrhythmic, beta blockade  Negative inotrope  Antihypertensives Alpha Blockers Doxazosin Calcium Channel Blockers Diltiazem Verapamil Nifedipine Central Alpha Adrenergics Moxonidine Peripheral Vasodilators Minoxidil  Increases renin and aldosterone  Negative inotrope    Possible sympathetic withdrawal  Unknown  Anti-infective Itraconazole Amphotericin B  Negative inotrope Unknown  Hematologic Anagrelide Cilostazol   Possible inhibition of PD IV Inhibition of PD III causing arrhythmias  Neurologic/Psychiatric Stimulants Anti-Seizure Drugs Carbamazepine Pregabalin Antidepressants Tricyclics Citalopram Parkinsons Bromocriptine Pergolide Pramipexole Antipsychotics Clozapine Antimigraine Ergotamine Methysergide Appetite suppressants Bipolar Lithium  Peripheral alpha and beta agonist activity  Negative inotrope and chronotrope Calcium channel blockade  Negative inotrope, proarrhythmic Dose-dependent QT prolongation  Excessive serotonin activity/valvular damage Excessive serotonin activity/valvular damage Unknown  IgE mediated hypersensitivy, calcium channel blockade  Excessive serotonin activity/valvular damage Excessive serotonin activity/valvular damage Valvular damage  Direct myofibrillar degeneration, adrenergic stimulation  Antimalarials Chloroquine Hydroxychloroquine Intracellular inhibition of lysosomal  enzymes  Urologic Agents Alpha Blockers Doxazosin Prazosin Tamsulosin Terazosin  Increased renin and aldosterone  Adapted from Page Williemae Natter, et al. "Drugs That May Cause or Exacerbate Heart Failure: A Scientific Statement from the American Heart  Association." Circulation 2016; 134:e32-e69. DOI: 10.1161/CIR.0000000000000426   MEDICATION ADHERENCES TIPS AND STRATEGIES Taking medication as prescribed improves patient outcomes in heart failure (reduces hospitalizations, improves symptoms, increases survival) Side effects of medications can be managed by decreasing doses, switching agents, stopping drugs, or adding additional therapy. Please let someone in the Heart Failure Clinic know if you have having bothersome side effects so we can modify your regimen. Do not alter your medication regimen without talking to [place].  Medication reminders can help patients remember to take drugs on time. If you are missing or forgetting doses you can try linking behaviors, using pill boxes, or an electronic reminder like an alarm on your phone or an app. Some people can also get automated phone calls as medication reminders.

## 2020-05-23 NOTE — Patient Instructions (Signed)
Continue weighing daily and call for an overnight weight gain of > 2 pounds or a weekly weight gain of >5 pounds. 

## 2020-05-23 NOTE — Progress Notes (Signed)
Patient ID: Brent [name], male    DOB: 04/20/1960, 60 y.o.   MRN: [id number]  HPI  Mr Brent [name] is a 60 y/o male with a history of hyperlipidemia, HTN, COPD, chronic pain, hepatitis C, depression, spinal stenosis, subarachnoid hemorrhage, current tobacco use and chronic heart failure.   Echo report from 03/01/19 reviewed and showed an EF of 25-30% along with moderately elevated PA pressure.   Catheterization report from 03/04/19 showed:  Mid LAD lesion is 65% stenosed.  Mid Cx lesion is 100% stenosed.  Prox RCA to Mid RCA lesion is 30% stenosed.  Admitted 05/13/20 due to septic and hypovolemic shock secondary to acute cholecystitis requiring vasopressors. Surgical consult obtained. S/p IR guided percutaneous cholecystostomy tube placement 05/14/2020. Given antibiotics. Discharged after 5 days.   Patient presents today for a follow-up visit with a chief complaint of moderate fatigue with little exertion. He describes this as chronic in nature having been present for several months although is worse recently after his recent admission with sepsis. He has associated shortness of breath, left leg numbness, weakness, difficulty sleeping and discomfort around drainage tube. He denies any dizziness, abdominal distention, palpitations, pedal edema, chest pain, abdominal pain, cough or weight gain.   Says that the plan is to at some point get his gallbladder removed. Says that his drain is still draining "quite a bit".   Past Medical History:  Diagnosis Date  . CHF (congestive heart failure) (HCC)   . Chronic pain   . COPD (chronic obstructive pulmonary disease) (HCC)   . Depression   . Hepatitis C   . Hyperlipidemia   . Hypertension   . Spinal stenosis   . Subarachnoid hemorrhage [facility])    Past Surgical History:  Procedure Laterality Date  . LEFT HEART CATH AND CORONARY ANGIOGRAPHY N/A 03/04/2019   Procedure: LEFT HEART CATH AND CORONARY ANGIOGRAPHY;  Surgeon: Lamar Blinks, MD;  Location: [facility]  INVASIVE CV LAB;  Service: Cardiovascular;  Laterality: N/A;   No family history on file. Social History   Tobacco Use  . Smoking status: Current Every Day Smoker    Packs/day: 2.00    Types: Cigarettes  . Smokeless tobacco: Never Used  Substance Use Topics  . Alcohol use: Not Currently   No Known Allergies  Prior to Admission medications   Medication Sig Start Date End Date Taking? Authorizing Provider  albuterol (VENTOLIN HFA) 108 (90 Base) MCG/ACT inhaler Inhale 2 puffs into the lungs every 6 (six) hours as needed. 06/05/19 06/04/20 Yes [provider]  amiodarone (PACERONE) 400 MG tablet Take 1 tablet (400 mg total) by mouth daily. 08/17/19  Yes Clarisa Kindred A, FNP  amoxicillin-clavulanate (AUGMENTIN) 875-125 MG tablet Take 1 tablet by mouth every 12 (twelve) hours for 5 days. 05/18/20 05/23/20 Yes Harlon Ditty D, NP  aspirin EC 81 MG EC tablet Take 1 tablet (81 mg total) by mouth daily. 03/06/19  Yes Danford, Earl Lites, MD  atorvastatin (LIPITOR) 80 MG tablet Take 1 tablet (80 mg total) by mouth daily at 6 PM. 08/17/19  Yes Ellee Wawrzyniak, Jarold Song, FNP  budesonide-formoterol (SYMBICORT) 160-4.5 MCG/ACT inhaler Inhale 2 puffs into the lungs 2 (two) times daily. 06/05/19 06/04/20 Yes [provider]  buPROPion (WELLBUTRIN XL) 150 MG 24 hr tablet Take 150 mg by mouth daily.   Yes [provider]  carvedilol (COREG) 3.125 MG tablet Take 1 tablet (3.125 mg total) by mouth 2 (two) times daily with a meal. 12/21/19  Yes Delma Freeze, FNP  cyclobenzaprine (  FLEXERIL) 10 MG tablet Take 5-10 mg by mouth 2 (two) times daily.   Yes [provider]  diclofenac Sodium (VOLTAREN) 1 % GEL Apply 2 g topically 4 (four) times daily as needed. 04/06/20  Yes [provider]  empagliflozin (JARDIANCE) 10 MG TABS tablet Take 1 tablet (10 mg total) by mouth daily before breakfast. 12/21/19  Yes Clarisa Kindred A, FNP  gabapentin (NEURONTIN) 300 MG capsule Take 300 mg by  mouth 2 (two) times daily.   Yes [provider]  isosorbide mononitrate (IMDUR) 30 MG 24 hr tablet Take 30 mg by mouth daily.   Yes [provider]  Multiple Vitamin (MULTIVITAMIN WITH MINERALS) TABS tablet Take 1 tablet by mouth daily.   Yes [provider]  naproxen sodium (ALEVE) 220 MG tablet Take 220 mg by mouth daily as needed.   Yes [provider]  nicotine (NICODERM CQ - DOSED IN MG/24 HOURS) 21 mg/24hr patch Place 1 patch (21 mg total) onto the skin daily. 04/06/20  Yes Aubert Choyce, Inetta Fermo A, FNP  oxyCODONE (ROXICODONE) 15 MG immediate release tablet Take 1 tablet (15 mg total) by mouth every 4 (four) hours as needed for moderate pain. 03/05/19  Yes Danford, Earl Lites, MD  sacubitril-valsartan (ENTRESTO) 97-103 MG Take 1 tablet by mouth 2 (two) times daily. 12/21/19  Yes Noma Quijas, Inetta Fermo A, FNP  sodium chloride flush 0.9 % SOLN injection Inject 5 mLs into the vein as needed. 05/16/20  Yes Sakai, Isami, DO  spironolactone (ALDACTONE) 25 MG tablet Take 1 tablet (25 mg total) by mouth daily. 08/17/19  Yes Solly Derasmo, Inetta Fermo A, FNP  nitroGLYCERIN (NITROSTAT) 0.4 MG SL tablet Place 1 tablet (0.4 mg total) under the tongue every 5 (five) minutes x 3 doses as needed for chest pain. Patient not taking: Reported on 05/23/2020 03/05/19   Alberteen Sam, MD    Review of Systems  Constitutional: Positive for fatigue (tire easily). Negative for appetite change.  HENT: Negative for congestion, postnasal drip and sore throat.   Eyes: Negative.   Respiratory: Positive for shortness of breath. Negative for cough and wheezing.   Cardiovascular: Negative for chest pain, palpitations and leg swelling.  Gastrointestinal: Negative for abdominal distention and abdominal pain.  Endocrine: Negative.   Genitourinary: Negative.   Musculoskeletal: Positive for arthralgias (left shoulder pain) and back pain.  Skin: Negative.   Allergic/Immunologic: Negative.   Neurological: Positive  for weakness (left lower leg) and numbness (left lower leg). Negative for dizziness and light-headedness.  Hematological: Negative for adenopathy. Does not bruise/bleed easily.  Psychiatric/Behavioral: Positive for dysphoric mood and sleep disturbance (sleeping on 2 pillows). The patient is not nervous/anxious.    Vitals:   05/23/20 1157  BP: 119/85  Pulse: 88  Resp: 18  SpO2: 94%  Weight: 203 lb (92.1 kg)  Height: 6' (1.829 m)   Wt Readings from Last 3 Encounters:  05/23/20 203 lb (92.1 kg)  05/17/20 207 lb 7.3 oz (94.1 kg)  03/22/20 213 lb 2 oz (96.7 kg)   Lab Results  Component Value Date   CREATININE 1.08 05/17/2020   CREATININE 1.16 05/16/2020   CREATININE 1.09 05/15/2020    Physical Exam Vitals and nursing note reviewed.  Constitutional:      Appearance: He is well-developed.  HENT:     Head: Normocephalic and atraumatic.  Neck:     Vascular: No JVD.  Cardiovascular:     Rate and Rhythm: Normal rate and regular rhythm.  Pulmonary:     Effort:  Pulmonary effort is normal. No respiratory distress.     Breath sounds: No wheezing, rhonchi or rales.  Abdominal:     Palpations: Abdomen is soft.     Tenderness: There is no abdominal tenderness.  Musculoskeletal:     Cervical back: Normal range of motion and neck supple.     Right lower leg: No tenderness. No edema.     Left lower leg: No tenderness. No edema.  Skin:    General: Skin is warm and dry.  Neurological:     General: No focal deficit present.     Mental Status: He is alert and oriented to person, place, and time.  Psychiatric:        Mood and Affect: Mood normal.        Behavior: Behavior normal.     Assessment & Plan:  1: Chronic heart failure with reduced ejection fraction- - NYHA class III - euvolemic today - weighing daily; reminded to call for an overnight weight gain of >2 pounds or a weekly weight gain of >5 pounds - weight down 10 pounds from last visit here 2 months ago - not adding  salt; reviewing food labels for sodium content; reviewed the importance of closely following a low sodium diet  - saw cardiology (Paraschos) 02/09/20; returns early May - GDMT of jardiance, entresto, carvedilol and spironolactone; discussed titrating up carvedilol at next visit if able; meds were held short-term in hospital due to hypotension - BNP 05/13/20 was 210.0 - PharmD reconciled medications w/ the patient  2: HTN- - BP looks good today (119/85); patient checking his BP at home - saw PCP ([name]) 06/05/19 - BMP 05/17/20 reviewed and showed sodium 137, potassium 3.9, creatinine 1.08 and GFR >60  3: COPD/ tobacco use-  - hasn't smoked since 04/05/20 & he was congratulated on that; wearing nicoderm patch - 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 - continued cessation discussed for 3 minutes with him; used to smoke up to 3 ppd - PFT's completed 11/26/19  4: VT- - ICD placed January 2021 - no recent shocks to report  5: 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   Return in 1 month or sooner for any questions/problems before then.

## 2020-05-31 ENCOUNTER — Other Ambulatory Visit (HOSPITAL_COMMUNITY): Payer: Self-pay

## 2020-05-31 MED ORDER — NORMAL SALINE FLUSH 0.9 % IV SOLN: INTRAVENOUS | 3 refills | Status: DC

## 2020-06-22 ENCOUNTER — Encounter: Payer: Self-pay | Admitting: Family

## 2020-06-22 ENCOUNTER — Other Ambulatory Visit: Payer: Self-pay

## 2020-06-22 ENCOUNTER — Ambulatory Visit: Payer: Medicaid Other | Attending: Family | Admitting: Family

## 2020-06-22 ENCOUNTER — Telehealth: Payer: Self-pay | Admitting: Family

## 2020-06-22 ENCOUNTER — Encounter: Payer: Self-pay | Admitting: Pharmacist

## 2020-06-22 VITALS — BP 100/60 | HR 86 | Resp 20 | Ht 72.0 in | Wt 200.0 lb

## 2020-06-22 DIAGNOSIS — I10 Essential (primary) hypertension: Secondary | ICD-10-CM

## 2020-06-22 DIAGNOSIS — I472 Ventricular tachycardia, unspecified: Secondary | ICD-10-CM

## 2020-06-22 DIAGNOSIS — Z95810 Presence of automatic (implantable) cardiac defibrillator: Secondary | ICD-10-CM | POA: Diagnosis not present

## 2020-06-22 DIAGNOSIS — Z87891 Personal history of nicotine dependence: Secondary | ICD-10-CM | POA: Diagnosis not present

## 2020-06-22 DIAGNOSIS — I5022 Chronic systolic (congestive) heart failure: Secondary | ICD-10-CM | POA: Diagnosis not present

## 2020-06-22 DIAGNOSIS — B181 Chronic viral hepatitis B without delta-agent: Secondary | ICD-10-CM | POA: Diagnosis not present

## 2020-06-22 DIAGNOSIS — I110 Hypertensive heart disease with heart failure: Secondary | ICD-10-CM | POA: Diagnosis not present

## 2020-06-22 DIAGNOSIS — K7460 Unspecified cirrhosis of liver: Secondary | ICD-10-CM | POA: Diagnosis not present

## 2020-06-22 DIAGNOSIS — Z716 Tobacco abuse counseling: Secondary | ICD-10-CM | POA: Diagnosis not present

## 2020-06-22 DIAGNOSIS — G8929 Other chronic pain: Secondary | ICD-10-CM | POA: Insufficient documentation

## 2020-06-22 DIAGNOSIS — E785 Hyperlipidemia, unspecified: Secondary | ICD-10-CM | POA: Diagnosis not present

## 2020-06-22 DIAGNOSIS — Z7982 Long term (current) use of aspirin: Secondary | ICD-10-CM | POA: Insufficient documentation

## 2020-06-22 DIAGNOSIS — Z7951 Long term (current) use of inhaled steroids: Secondary | ICD-10-CM | POA: Insufficient documentation

## 2020-06-22 DIAGNOSIS — Z79899 Other long term (current) drug therapy: Secondary | ICD-10-CM | POA: Diagnosis not present

## 2020-06-22 DIAGNOSIS — B182 Chronic viral hepatitis C: Secondary | ICD-10-CM

## 2020-06-22 DIAGNOSIS — Z7984 Long term (current) use of oral hypoglycemic drugs: Secondary | ICD-10-CM | POA: Insufficient documentation

## 2020-06-22 DIAGNOSIS — B1920 Unspecified viral hepatitis C without hepatic coma: Secondary | ICD-10-CM | POA: Insufficient documentation

## 2020-06-22 DIAGNOSIS — Z791 Long term (current) use of non-steroidal anti-inflammatories (NSAID): Secondary | ICD-10-CM | POA: Diagnosis not present

## 2020-06-22 DIAGNOSIS — F32A Depression, unspecified: Secondary | ICD-10-CM | POA: Insufficient documentation

## 2020-06-22 DIAGNOSIS — K819 Cholecystitis, unspecified: Secondary | ICD-10-CM | POA: Insufficient documentation

## 2020-06-22 DIAGNOSIS — J449 Chronic obstructive pulmonary disease, unspecified: Secondary | ICD-10-CM | POA: Insufficient documentation

## 2020-06-22 LAB — BASIC METABOLIC PANEL
Anion gap: 8 (ref 5–15)
BUN: 35 mg/dL — ABNORMAL HIGH (ref 6–20)
CO2: 23 mmol/L (ref 22–32)
Calcium: 8.6 mg/dL — ABNORMAL LOW (ref 8.9–10.3)
Chloride: 105 mmol/L (ref 98–111)
Creatinine, Ser: 1.21 mg/dL (ref 0.61–1.24)
GFR, Estimated: >60 mL/min (ref >60)
Glucose, Bld: 124 mg/dL — ABNORMAL HIGH (ref 70–99)
Potassium: 5.4 mmol/L — ABNORMAL HIGH (ref 3.5–5.1)
Sodium: 136 mmol/L (ref 135–145)

## 2020-06-22 MED ORDER — SPIRONOLACTONE 25 MG PO TABS: 12.5000 mg | ORAL_TABLET | Freq: Every day | ORAL | 3 refills | Status: DC

## 2020-06-22 NOTE — Patient Instructions (Addendum)
Continue weighing daily and call for an overnight weight gain of > 2 pounds or a weekly weight gain of >5 pounds.   Drink between 60-64 ounces of fluid daily.    Call primary care doctor to schedule an appointment   Decrease your entresto to 1/2 tablet in the morning and 1/2 tablet in the evening

## 2020-06-22 NOTE — Telephone Encounter (Signed)
Spoke with patient regarding BMP results obtained earlier today. Renal function looks good but potassium is 5.4.   Currently taking entresto 97/103mg  (although I decreased it earlier today to 49/51mg ) and spironolactone 25mg  daily.   Since we've already decreased the entresto, will go ahead and also decrease his spironolactone from 25mg  daily to 12.5mg  daily.   He has an appt with cardiology on 06/30/20 so will ask them to recheck his lab work at that time. Patient verbalized understanding and was able to repeat the change in medications.

## 2020-06-22 NOTE — Progress Notes (Signed)
[facility] - HEART FAILURE CLINIC - PHARMACIST COUNSELING NOTE  ADHERENCE ASSESSMENT  Adherence strategy: Pill box and wife helps him remember   Do you ever forget to take your medication? [] Yes (1) [x] No (0)  Do you ever skip doses due to side effects? [x] Yes (1) [] No (0)  Do you have trouble affording your medicines? [] Yes (1) [x] No (0)  Are you ever unable to pick up your medication due to transportation difficulties? [] Yes (1) [x] No (0)  Do you ever stop taking your medications because you don't believe they are helping? [] Yes (1) [x] No (0)   Recommendations given to patient about increasing adherence: Pt unable to tolerate carvedilol due to low BP. Pt also unable to tolerate bupropion due to ADRs (aggressive behavior and mood changes)  Guideline-Directed Medical Therapy/Evidence Based Medicine  ACE/ARB/ARNI: Blocker: Carvedilol Aldosterone Antagonist: Spironolactone Diuretic: None    SUBJECTIVE  HPI: 60 yo M presenting for heart failure clinic follow-up. Pt was recently admitted for cholecystitis with tube placed. PMH includes subarachnoid hemorrhage, HTN, HLD, hepatitis C, depression, COPD, chronic pain, and CHF. Pt brought medication bottles in which were reviewed and medication list updated. No barriers to adherence identified through medication reconciliation. Pt mentioned BP was low during recent admission thought to be due to cholecystitis. Pt is monitoring BP frequently and reports that he has not taken his carvedilol for about a week due to low BP. Pt has recently quit smoking and has made it 2 months without smoking.   Past Medical History:  Diagnosis Date  . CHF (congestive heart failure) (HCC)   . Chronic pain   . COPD (chronic obstructive pulmonary disease) (HCC)   . Depression   . Hepatitis C   . Hyperlipidemia   . Hypertension   . Spinal stenosis   . Subarachnoid hemorrhage (HCC)      OBJECTIVE   Vital signs: BP  100/60, weight (pounds) 200 ECHO: Date 03/01/2019, EF 25-30%, notes: left ventricle has severely decreased function. There is mildly increased left ventricular hypertrophy. Mildly dilated left ventricular internal cavity size.  Cath: Date 03/04/19, EF 25%, notes:  Mid LAD lesion is 65% stenosed.  Mid Cx lesion is 100% stenosed.  Prox RCA to Mid RCA lesion is 30% stenosed.  BMP Latest Ref Rng & Units 05/17/2020 05/16/2020 05/15/2020  Glucose 70 - 99 mg/dL ) ) )  BUN 6 - 20 mg/dL Ihor Gully) 41) 03/03/2019)  Creatinine 0.61 - 1.24 mg/dL 03/06/19 07/17/2020 07/16/2020  Sodium 135 - 145 mmol/L 137 138 137  Potassium 3.5 - 5.1 mmol/L 3.9 3.6 3.7  Chloride 98 - 111 mmol/L 105 103 102  CO2 22 - 32 mmol/L 24 26 25   Calcium 8.9 - 10.3 mg/dL 7.4(L) 7.6(L) 7.8(L)    ASSESSMENT/PLAN CHF/HTN -continue empagliflozin 10mg  daily -recommended decreasing dose of either spironolactone or Entresto since pt BP is low even while being off of carvedilol for 1 week.  -would hold off on diuretic as pt has low BP   Chest pain -continue isosorbide mononitrate 30mg  daily  Cholecystitis -pt has drain in place -continue maintenance care at incision site to avoid clogging and infection  Depression -pt reports side effects with bupropion; has been on duloxetine in the past with no improvement. Pt reports taking his wifes Effexor which worked great for him -informed NP of pt's concerns and will likely need to follow up with PCP for changing antidepressant regimen  HLD -continue atorvastatin 80mg  daily -continue aspirin 81mg  daily   COPD -pt  is well controlled on Symbicort inhaler and has not had to use his "rescue" inhaler for "a long time" -continue albuterol 2 puffs q6h as needed and Symbicort 2 puffs BID  Chronic pain -continue oxycodone 15mg  q4h PRN -continue diclofenac gel QID PRN -encouraged pt to avoid naproxen and other NSAIDs and to use Tylenol if need additional pain relief -continue gabapentin 300mg  BID   -continue cyclobenzaprine 5mg  QHS PRN  Time spent: 10 minutes  Raiford Noble, PharmD Pharmacy Resident  06/22/2020 10:26 AM    Current Outpatient Medications:  .  albuterol (VENTOLIN HFA) 108 (90 Base) MCG/ACT inhaler, Inhale 2 puffs into the lungs every 6 (six) hours as needed., Disp: , Rfl:  .  amiodarone (PACERONE) 400 MG tablet, Take 1 tablet (400 mg total) by mouth daily., Disp: 90 tablet, Rfl: 3 .  aspirin EC 81 MG EC tablet, Take 1 tablet (81 mg total) by mouth daily., Disp:  , Rfl:  .  atorvastatin (LIPITOR) 80 MG tablet, Take 1 tablet (80 mg total) by mouth daily at 6 PM., Disp: 90 tablet, Rfl: 3 .  azelastine (OPTIVAR) 0.05 % ophthalmic solution, Place 2 drops into both eyes daily as needed., Disp: , Rfl:  .  budesonide-formoterol (SYMBICORT) 160-4.5 MCG/ACT inhaler, Inhale 2 puffs into the lungs 2 (two) times daily., Disp: , Rfl:  .  buPROPion (WELLBUTRIN XL) 150 MG 24 hr tablet, Take 150 mg by mouth daily. (Patient not taking: Reported on 06/22/2020), Disp: , Rfl:  .  carvedilol (COREG) 3.125 MG tablet, Take 1 tablet (3.125 mg total) by mouth 2 (two) times daily with a meal. (Patient not taking: Reported on 06/22/2020), Disp: 180 tablet, Rfl: 3 .  cyclobenzaprine (FLEXERIL) 10 MG tablet, Take 5 mg by mouth at bedtime as needed., Disp: , Rfl:  .  diclofenac Sodium (VOLTAREN) 1 % GEL, Apply 2 g topically 4 (four) times daily as needed., Disp: , Rfl:  .  empagliflozin (JARDIANCE) 10 MG TABS tablet, Take 1 tablet (10 mg total) by mouth daily before breakfast., Disp: 90 tablet, Rfl: 3 .  gabapentin (NEURONTIN) 300 MG capsule, Take 300 mg by mouth at bedtime., Disp: , Rfl:  .  isosorbide mononitrate (IMDUR) 30 MG 24 hr tablet, Take 30 mg by mouth daily., Disp: , Rfl:  .  Multiple Vitamin (MULTIVITAMIN WITH MINERALS) TABS tablet, Take 1 tablet by mouth daily., Disp: , Rfl:  .  naproxen sodium (ALEVE) 220 MG tablet, Take 220 mg by mouth daily as needed., Disp: , Rfl:  .  nicotine  (NICODERM CQ - DOSED IN MG/24 HOURS) 21 mg/24hr patch, Place 1 patch (21 mg total) onto the skin daily. (Patient not taking: Reported on 06/22/2020), Disp: 28 patch, Rfl: 5 .  nitroGLYCERIN (NITROSTAT) 0.4 MG SL tablet, Place 1 tablet (0.4 mg total) under the tongue every 5 (five) minutes x 3 doses as needed for chest pain. (Patient not taking: No sig reported), Disp:  , Rfl: 12 .  oxyCODONE (ROXICODONE) 15 MG immediate release tablet, Take 1 tablet (15 mg total) by mouth every 4 (four) hours as needed for moderate pain., Disp: 30 tablet, Rfl: 0 .  sacubitril-valsartan (ENTRESTO) 97-103 MG, Take 1 tablet by mouth 2 (two) times daily. (Patient taking differently: Take 0.5 tablets by mouth 2 (two) times daily.), Disp: 180 tablet, Rfl: 3 .  Sodium Chloride Flush (NORMAL SALINE FLUSH) 0.9 % SOLN, Flush 1 syringe (10 ml) thru catheter two times daily, Disp: 600 mL, Rfl: 3 .  sodium chloride  flush 0.9 % SOLN injection, Inject 5 mLs into the vein as needed., Disp: 450 mL, Rfl: 3 .  spironolactone (ALDACTONE) 25 MG tablet, Take 1 tablet (25 mg total) by mouth daily., Disp: 90 tablet, Rfl: 3   COUNSELING POINTS/CLINICAL PEARLS Carvedilol (Goal: weight less than 85 kg is 25 mg BID, weight greater than 85 kg is 50 mg BID)  Patient should avoid activities requiring coordination until drug effects are realized, as drug may cause dizziness.  This drug may cause diarrhea, nausea, vomiting, arthralgia, back pain, myalgia, headache, vision disorder, erectile dysfunction, reduced libido, or fatigue.  Instruct patient to report signs/symptoms of adverse cardiovascular effects such as hypotension (especially in elderly patients), arrhythmias, syncope, palpitations, angina, or edema.  Drug may mask symptoms of hypoglycemia. Advise diabetic patients to carefully monitor blood sugar levels.  Patient should take drug with food.  Advise patient against sudden discontinuation of drug. Entresto (Goal: 97/103 mg twice  daily)  Warn male patient to avoid pregnancy during therapy and to report a pregnancy to a physician.  Advise patient to report symptomatic hypotension.  Side effects may include hyperkalemia, cough, dizziness, or renal failure. Spironolactone  Warn patient to report dehydration, hypotension, or symptoms of worsening renal function.  Counsel male patient to report gynecomastia.  Side effects may include diarrhea, nausea, vomiting, abdominal cramping, fever, leg cramps, lethargy, mental confusion, decreased libido, irregular menses, and rash. Suspension: Tell patient to take drug consistently with respect to food, either before or after a meal.  Advise patient to avoid potassium supplements and foods containing high levels of potassium, including salt substitutes.  DRUGS TO AVOID IN HEART FAILURE  Drug or Class Mechanism  Analgesics . NSAIDs . COX-2 inhibitors . Glucocorticoids  Sodium and water retention, increased systemic vascular resistance, decreased response to diuretics   Diabetes Medications . Metformin . Thiazolidinediones o Rosiglitazone (Avandia) o Pioglitazone (Actos) . DPP4 Inhibitors o Saxagliptin (Onglyza) o Sitagliptin (Januvia)   Lactic acidosis Possible calcium channel blockade   Unknown  Antiarrhythmics . Class I  o Flecainide o Disopyramide . Class III o Sotalol . Other o Dronedarone  Negative inotrope, proarrhythmic   Proarrhythmic, beta blockade  Negative inotrope  Antihypertensives . Alpha Blockers o Doxazosin . Calcium Channel Blockers o Diltiazem o Verapamil o Nifedipine . Central Alpha Adrenergics o Moxonidine . Peripheral Vasodilators o Minoxidil  Increases renin and aldosterone  Negative inotrope    Possible sympathetic withdrawal  Unknown  Anti-infective . Itraconazole . Amphotericin B  Negative inotrope Unknown  Hematologic . Anagrelide . Cilostazol   Possible inhibition of PD IV Inhibition of PD III causing  arrhythmias  Neurologic/Psychiatric . Stimulants . Anti-Seizure Drugs o Carbamazepine o Pregabalin . Antidepressants o Tricyclics o Citalopram . Parkinsons o Bromocriptine o Pergolide o Pramipexole . Antipsychotics o Clozapine . Antimigraine o Ergotamine o Methysergide . Appetite suppressants . Bipolar o Lithium  Peripheral alpha and beta agonist activity  Negative inotrope and chronotrope Calcium channel blockade  Negative inotrope, proarrhythmic Dose-dependent QT prolongation  Excessive serotonin activity/valvular damage Excessive serotonin activity/valvular damage Unknown  IgE mediated hypersensitivy, calcium channel blockade  Excessive serotonin activity/valvular damage Excessive serotonin activity/valvular damage Valvular damage  Direct myofibrillar degeneration, adrenergic stimulation  Antimalarials . Chloroquine . Hydroxychloroquine Intracellular inhibition of lysosomal enzymes  Urologic Agents . Alpha Blockers o Doxazosin o Prazosin o Tamsulosin o Terazosin  Increased renin and aldosterone  Adapted from Page RL, et al. "Drugs That May Cause or Exacerbate Heart Failure: A Scientific Statement from the American Heart  Association." Circulation 2016; 134:e32-e69. DOI: 10.1161/CIR.0000000000000426   MEDICATION ADHERENCES TIPS AND STRATEGIES 1. Taking medication as prescribed improves patient outcomes in heart failure (reduces hospitalizations, improves symptoms, increases survival) 2. Side effects of medications can be managed by decreasing doses, switching agents, stopping drugs, or adding additional therapy. Please let someone in the Heart Failure Clinic know if you have having bothersome side effects so we can modify your regimen. Do not alter your medication regimen without talking to [place].  3. Medication reminders can help patients remember to take drugs on time. If you are missing or forgetting doses you can try linking behaviors, using pill boxes, or  an electronic reminder like an alarm on your phone or an app. Some people can also get automated phone calls as medication reminders.

## 2020-06-22 NOTE — Progress Notes (Signed)
Patient ID: Brent [name], male    DOB: 1960-12-06, 60 y.o.   MRN: [id number]   Brent [name] is a 60 y/o male with a history of hyperlipidemia, HTN, COPD, chronic pain, hepatitis C, depression, spinal stenosis, subarachnoid hemorrhage, current tobacco use and chronic heart failure.   Echo report from 03/01/19 reviewed and showed an EF of 25-30% along with moderately elevated PA pressure.   Catheterization report from 03/04/19 showed:  Mid LAD lesion is 65% stenosed.  Mid Cx lesion is 100% stenosed.  Prox RCA to Mid RCA lesion is 30% stenosed.  Admitted 05/13/20 due to septic and hypovolemic shock secondary to acute cholecystitis requiring vasopressors. Surgical consult obtained. S/p IR guided percutaneous cholecystostomy tube placement 05/14/2020. Given antibiotics. Discharged after 5 days.   Patient presents today for a follow-up visit with a chief complaint of moderate fatigue upon minimal exertion. He describes this as chronic in nature having been present for several years. He has associated shortness of breath, light-headedness, weakness, depression and chronic pain along with this. He denies any difficulty sleeping, abdominal distention, palpitations, pedal edema, chest pain, cough or weight gain.   His biggest concerns today are of his home BP getting low with systolic in the 60's-70's/ 30-40's. He had stopped taking his carvedilol ~ 1 week ago because of his BP but hasn't seen a big change since then although it's generally running now in the 90-100's/ 50-60's.   He also mentions that he feels like his depression is worsening. When he took the wellbutrin, he says that he because more irritable and angry so he stopped taking it.   Past Medical History:  Diagnosis Date  . CHF (congestive heart failure) (HCC)   . Chronic pain   . COPD (chronic obstructive pulmonary disease) (HCC)   . Depression   . Hepatitis C   . Hyperlipidemia   . Hypertension   . Spinal stenosis   . Subarachnoid  hemorrhage [facility])    Past Surgical History:  Procedure Laterality Date  . LEFT HEART CATH AND CORONARY ANGIOGRAPHY N/A 03/04/2019   Procedure: LEFT HEART CATH AND CORONARY ANGIOGRAPHY;  Surgeon: Lamar Blinks, MD;  Location: [facility] INVASIVE CV LAB;  Service: Cardiovascular;  Laterality: N/A;   No family history on file. Social History   Tobacco Use  . Smoking status: Former Smoker    Packs/day: 2.00    Types: Cigarettes    Start date: 04/05/2020  . Smokeless tobacco: Never Used  Substance Use Topics  . Alcohol use: Not Currently   No Known Allergies  Prior to Admission medications   Medication Sig Start Date End Date Taking? Authorizing Provider  albuterol (VENTOLIN HFA) 108 (90 Base) MCG/ACT inhaler Inhale 2 puffs into the lungs every 6 (six) hours as needed. 06/05/19 06/22/20 Yes [provider]  amiodarone (PACERONE) 400 MG tablet Take 1 tablet (400 mg total) by mouth daily. 08/17/19  Yes Clarisa Kindred A, FNP  aspirin EC 81 MG EC tablet Take 1 tablet (81 mg total) by mouth daily. 03/06/19  Yes Danford, Earl Lites, MD  atorvastatin (LIPITOR) 80 MG tablet Take 1 tablet (80 mg total) by mouth daily at 6 PM. 08/17/19  Yes Glee Lashomb, Inetta Fermo A, FNP  azelastine (OPTIVAR) 0.05 % ophthalmic solution Place 2 drops into both eyes daily as needed.   Yes [provider]  budesonide-formoterol (SYMBICORT) 160-4.5 MCG/ACT inhaler Inhale 2 puffs into the lungs 2 (two) times daily. 06/05/19 06/22/20 Yes [provider]  cyclobenzaprine (FLEXERIL) 10 MG tablet Take  5 mg by mouth at bedtime as needed.   Yes [provider]  diclofenac Sodium (VOLTAREN) 1 % GEL Apply 2 g topically 4 (four) times daily as needed. 04/06/20  Yes [provider]  empagliflozin (JARDIANCE) 10 MG TABS tablet Take 1 tablet (10 mg total) by mouth daily before breakfast. 12/21/19  Yes Clarisa Kindred A, FNP  gabapentin (NEURONTIN) 300 MG capsule Take 300 mg by mouth at bedtime.   Yes [provider]  isosorbide mononitrate (IMDUR) 30 MG 24 hr tablet Take 30 mg by mouth daily.   Yes [provider]  Multiple Vitamin (MULTIVITAMIN WITH MINERALS) TABS tablet Take 1 tablet by mouth daily.   Yes [provider]  naproxen sodium (ALEVE) 220 MG tablet Take 220 mg by mouth daily as needed.   Yes [provider]  oxyCODONE (ROXICODONE) 15 MG immediate release tablet Take 1 tablet (15 mg total) by mouth every 4 (four) hours as needed for moderate pain. 03/05/19  Yes Danford, Earl Lites, MD  sacubitril-valsartan (ENTRESTO) 97-103 MG Take 1 tablet by mouth 2 (two) times daily. 12/21/19  Yes Clarisa Kindred A, FNP  Sodium Chloride Flush (NORMAL SALINE FLUSH) 0.9 % SOLN Flush 1 syringe (10 ml) thru catheter two times daily 05/31/20  Yes   sodium chloride flush 0.9 % SOLN injection Inject 5 mLs into the vein as needed. 05/16/20  Yes Sakai, Isami, DO  spironolactone (ALDACTONE) 25 MG tablet Take 1 tablet (25 mg total) by mouth daily. 08/17/19  Yes Lamont Tant A, FNP  buPROPion (WELLBUTRIN XL) 150 MG 24 hr tablet Take 150 mg by mouth daily. Patient not taking: Reported on 06/22/2020    [provider]  carvedilol (COREG) 3.125 MG tablet Take 1 tablet (3.125 mg total) by mouth 2 (two) times daily with a meal. Patient not taking: Reported on 06/22/2020 12/21/19   Delma Freeze, FNP  nitroGLYCERIN (NITROSTAT) 0.4 MG SL tablet Place 1 tablet (0.4 mg total) under the tongue every 5 (five) minutes x 3 doses as needed for chest pain. Patient not taking: No sig reported 03/05/19   Alberteen Sam, MD    Review of Systems  Constitutional: Positive for fatigue (tire easily). Negative for appetite change.  HENT: Negative for congestion, postnasal drip and sore throat.   Eyes: Negative.   Respiratory: Positive for shortness of breath. Negative for cough and wheezing.   Cardiovascular: Negative for chest pain, palpitations and leg swelling.  Gastrointestinal:  Negative for abdominal distention and abdominal pain.  Endocrine: Negative.   Genitourinary: Negative.   Musculoskeletal: Positive for arthralgias (left shoulder pain) and back pain.  Skin: Negative.   Allergic/Immunologic: Negative.   Neurological: Positive for weakness (left lower leg), light-headedness and numbness (left lower leg). Negative for dizziness.  Hematological: Negative for adenopathy. Does not bruise/bleed easily.  Psychiatric/Behavioral: Positive for dysphoric mood. Negative for sleep disturbance (sleeping on 2 pillows). The patient is not nervous/anxious.    Vitals:   06/22/20 0917 06/22/20 0949  BP: 104/76 100/60  Pulse: 86   Resp: 20   SpO2: 100%   Weight: 200 lb (90.7 kg)   Height: 6' (1.829 m)    Wt Readings from Last 3 Encounters:  06/22/20 200 lb (90.7 kg)  05/23/20 203 lb (92.1 kg)  05/17/20 207 lb 7.3 oz (94.1 kg)   Lab Results  Component Value Date   CREATININE 1.08 05/17/2020   CREATININE 1.16 05/16/2020   CREATININE 1.09 05/15/2020    Physical Exam Vitals  and nursing note reviewed.  Constitutional:      Appearance: He is well-developed.  HENT:     Head: Normocephalic and atraumatic.  Neck:     Vascular: No JVD.  Cardiovascular:     Rate and Rhythm: Normal rate and regular rhythm.  Pulmonary:     Effort: Pulmonary effort is normal. No respiratory distress.     Breath sounds: No wheezing, rhonchi or rales.  Abdominal:     Palpations: Abdomen is soft.     Tenderness: There is no abdominal tenderness.  Musculoskeletal:     Cervical back: Normal range of motion and neck supple.     Right lower leg: No tenderness. No edema.     Left lower leg: No tenderness. No edema.  Skin:    General: Skin is warm and dry.  Neurological:     General: No focal deficit present.     Mental Status: He is alert and oriented to person, place, and time.  Psychiatric:        Mood and Affect: Mood normal.        Behavior: Behavior normal.    Assessment &  Plan:  1: Chronic heart failure with reduced ejection fraction- - NYHA class III - euvolemic today - weighing daily; reminded to call for an overnight weight gain of >2 pounds or a weekly weight gain of >5 pounds - weight down 3 pounds from last visit here 1 month ago - not adding salt; reviewing food labels for sodium content; reviewed the importance of closely following a low sodium diet  - saw cardiology (Paraschos) 02/09/20; returns late May - on GDMT of jardiance, entresto, carvedilol and spironolactone - BNP 05/13/20 was 210.0 - PharmD reconciled medications w/ the patient  2: HTN- - BP looks good today although on the low side; based on home BP readings, will decrease entresto to 1/2 tablet BID (= 49/51mg  dose) - resume carvedilol at 3.125mg  BID - saw PCP ([name]) 06/05/19; emphasized that he needed to make an appt to address his depression - BMP 05/17/20 reviewed and showed sodium 137, potassium 3.9, creatinine 1.08 and GFR >60 - get BMP today  3: COPD/ tobacco use-  - hasn't smoked since 04/05/20 & no nicotine patches for ~ 1 month he was congratulated on that;  - 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   Return in 3 weeks or sooner for any questions/problems before then

## 2020-06-23 ENCOUNTER — Ambulatory Visit: Payer: Self-pay | Admitting: Surgery
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
## 2020-06-27 ENCOUNTER — Encounter
Admission: RE | Admit: 2020-06-27 | Discharge: 2020-06-27 | Disposition: A | Discharge status: Home / Self Care | Payer: Medicaid Other | Source: Ambulatory Visit | Attending: Surgery | Admitting: Surgery

## 2020-06-27 ENCOUNTER — Other Ambulatory Visit: Payer: Self-pay

## 2020-06-27 HISTORY — DX: Unspecified cirrhosis of liver: K74.60

## 2020-06-27 HISTORY — DX: Chronic kidney disease, unspecified: N18.9

## 2020-06-27 HISTORY — DX: Gastro-esophageal reflux disease without esophagitis: K21.9

## 2020-06-27 HISTORY — DX: Unspecified osteoarthritis, unspecified site: M19.90

## 2020-06-27 HISTORY — DX: Atherosclerotic heart disease of native coronary artery without angina pectoris: I25.10

## 2020-06-27 HISTORY — DX: Spinal stenosis, lumbar region without neurogenic claudication: M48.061

## 2020-06-27 HISTORY — DX: Cardiac arrhythmia, unspecified: I49.9

## 2020-06-27 NOTE — Patient Instructions (Addendum)
INSTRUCTIONS FOR SURGERY     Your surgery is scheduled for: Thursday, June 2ND      To find out your arrival time for the day of surgery,          please call [phone] between 1 pm and 3 pm on : Wednesday, June 1ST     When you arrive for surgery, report to the REGISTRATION DESK IN THE MEDICAL MALL. ONCE THEY HAVE COMPLETED THEIR PROCESS, PROCEED TO THE SECOND FLOOR AND SIGN IN AT THE SURGERY DESK.    REMEMBER: Instructions that are not followed completely may result in serious medical risk,  up to and including death, or upon the discretion of your surgeon and anesthesiologist,            your surgery may need to be rescheduled.  __X__ 1. Do not eat food after midnight the night before your procedure.                    No gum, candy, lozenger, tic tacs, tums or hard candies.                  ABSOLUTELY NOTHING SOLID IN YOUR MOUTH AFTER MIDNIGHT                    You may drink unlimited clear liquids up to 2 hours before you are scheduled to arrive for surgery.                   Do not drink anything within those 2 hours unless you need to take medicine, then take the                   smallest amount you need.  Clear liquids include:  water, apple juice without pulp,                   any flavor Gatorade, Black coffee, black tea.  Sugar may be added but no dairy/ honey /lemon.                        Broth and jello is not considered a clear liquid.  __x__  2. On the morning of surgery, please brush your teeth with toothpaste and water. You may rinse with                  mouthwash if you wish but DO NOT SWALLOW TOOTHPASTE OR MOUTHWASH  __X___3. NO alcohol for 24 hours before or after surgery.  __x___ 4.  Do NOT smoke or use e-cigarettes for 24 HOURS PRIOR TO SURGERY.                      DO NOT Use any chewable tobacco products for at least 6 hours prior to surgery.  __x___ 5. If you start any new medication after this  appointment and prior to surgery, please                   Bring it with you on the day of surgery.  ___x__ 6. Notify your doctor if there is any change  in your medical condition, such as fever,                  infection, vomitting, diarrhea or any open sores.  __x___ 7.  USE the CHG SOAP as instructed, the night before surgery and the day of surgery.                   Once you have washed with this soap, do NOT use any of the following: Powders, perfumes                    or lotions. Please do not wear make up, hairpins, clips or nail polish. You may wear deodorant.                                                            Men may shave their face and neck.                     DO NOT wear ANY jewelry on the day of surgery. If there are rings that are too tight to                    remove easily, please address this prior to the surgery day. Piercings need to be removed.                                                                     NO METAL ON YOUR BODY.                   Do NOT bring any valuables.  If you came to Pre-Admit testing then you will not need license,                    insurance card or credit card.  If you will be staying overnight, please either leave your things in                    the car or have your family be responsible for these items.                    [facility] IS NOT RESPONSIBLE FOR BELONGINGS OR VALUABLES.  ___X__ 8. DO NOT wear contact lenses on surgery day.  You may not have dentures,                     Hearing aides, contacts or glasses in the operating room. These items can be                    Placed in the Recovery Room to receive immediately after surgery.  __x___ 9. IF YOU ARE SCHEDULED TO GO HOME ON THE SAME DAY, YOU MUST                   Have someone to drive you home and to stay with you  for the first 24 hours.  Have an arrangement prior to arriving on surgery day.  ___x__ 10. Take the following medications on the  morning of surgery with a sip of water:                              1. SYMBICORT                     2. ALBUTEROL                     3. AMIODARONE                     4. CARVEDILOL                     5. IMDUR                     6. OPTIVAR EYE DROPS                     7. OXYCODONE, if needed  _____ 11.  Follow any instructions provided to you by your surgeon.                        Such as enema, clear liquid bowel prep  __X__  12. STOP all ASPIRIN PRODUCTS AS OF ONE WEEK BEFORE SURGERY,(May 26th)                       THIS INCLUDES BC POWDERS / GOODIES POWDER  __x___ 13. STOP Anti-inflammatories as of ONE WEEK PRIOR TO SURGERY, (May 26th)                      This includes IBUPROFEN / MOTRIN / ADVIL / ALEVE/ NAPROXYN                    YOU MAY TAKE TYLENOL ANY TIME PRIOR TO SURGERY.  __x___ 14.  Stop supplements until after surgery.                     This includes: MULTIVITAMINS  ___X___15. DO NOT TAKE ANY DIABETIC MEDICINES ON THE DAY OF SURGERY.  __X____17.  Continue to take the following medications but do not take on the morning of surgery:                            JARDIANCE // ENTRESTO // ALDACTONE  ___X___18.  Wear clean and comfortable clothing to the hospital.  Loose bottoms would be most comfortable. BRING PHONE NUMBERS FOR YOUR CONTACT PEOPLE.  HAVE STOOL SOFTENERS AVAILABLE FOR USE AT HOME AFTER SURGERY.   CONTINUE TAKING YOUR EVENING MEDICINES AS USUAL.

## 2020-06-28 ENCOUNTER — Encounter: Payer: Self-pay | Admitting: Surgery

## 2020-06-30 ENCOUNTER — Other Ambulatory Visit: Payer: Self-pay

## 2020-06-30 ENCOUNTER — Other Ambulatory Visit
Admission: RE | Admit: 2020-06-30 | Discharge: 2020-06-30 | Disposition: A | Discharge status: Home / Self Care | Payer: Medicaid Other | Source: Ambulatory Visit | Attending: Physician Assistant | Admitting: Physician Assistant

## 2020-06-30 ENCOUNTER — Other Ambulatory Visit
Admission: RE | Admit: 2020-06-30 | Discharge: 2020-06-30 | Disposition: A | Discharge status: Home / Self Care | Payer: Medicaid Other | Source: Ambulatory Visit | Attending: Surgery | Admitting: Surgery

## 2020-06-30 ENCOUNTER — Encounter: Payer: Self-pay | Admitting: Surgery

## 2020-06-30 DIAGNOSIS — Z79899 Other long term (current) drug therapy: Secondary | ICD-10-CM | POA: Insufficient documentation

## 2020-06-30 DIAGNOSIS — I472 Ventricular tachycardia: Secondary | ICD-10-CM | POA: Insufficient documentation

## 2020-06-30 DIAGNOSIS — Z5181 Encounter for therapeutic drug level monitoring: Secondary | ICD-10-CM | POA: Insufficient documentation

## 2020-06-30 LAB — CBC
HCT: 34.2 % — ABNORMAL LOW (ref 39.0–52.0)
Hemoglobin: 11.4 g/dL — ABNORMAL LOW (ref 13.0–17.0)
MCH: 29.8 pg (ref 26.0–34.0)
MCHC: 33.3 g/dL (ref 30.0–36.0)
MCV: 89.3 fL (ref 80.0–100.0)
Platelets: 223 10*3/uL (ref 150–400)
RBC: 3.83 MIL/uL — ABNORMAL LOW (ref 4.22–5.81)
RDW: 13.8 % (ref 11.5–15.5)
WBC: 11.3 10*3/uL — ABNORMAL HIGH (ref 4.0–10.5)
nRBC: 0 % (ref 0.0–0.2)

## 2020-06-30 LAB — BASIC METABOLIC PANEL
Anion gap: 9 (ref 5–15)
BUN: 25 mg/dL — ABNORMAL HIGH (ref 6–20)
CO2: 25 mmol/L (ref 22–32)
Calcium: 8.7 mg/dL — ABNORMAL LOW (ref 8.9–10.3)
Chloride: 100 mmol/L (ref 98–111)
Creatinine, Ser: 1.27 mg/dL — ABNORMAL HIGH (ref 0.61–1.24)
GFR, Estimated: >60 mL/min (ref >60)
Glucose, Bld: 109 mg/dL — ABNORMAL HIGH (ref 70–99)
Potassium: 4.6 mmol/L (ref 3.5–5.1)
Sodium: 134 mmol/L — ABNORMAL LOW (ref 135–145)

## 2020-06-30 LAB — TSH: TSH: 3.45 u[IU]/mL (ref 0.350–4.500)
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
## 2020-07-11 ENCOUNTER — Ambulatory Visit: Payer: Medicaid Other | Admitting: Family

## 2020-07-12 MED ORDER — LACTATED RINGERS IV SOLN: INTRAVENOUS | Status: DC

## 2020-07-12 MED ORDER — ORAL CARE MOUTH RINSE: 15.0000 mL | Freq: Once | OROMUCOSAL | Status: AC

## 2020-07-12 MED ORDER — INDOCYANINE GREEN 25 MG IV SOLR
1.2500 mg | Freq: Once | INTRAVENOUS | Status: AC
  Administered 2020-07-13: 1.25 mg via INTRAVENOUS
  Filled 2020-07-12: qty 0.5

## 2020-07-12 MED ORDER — CHLORHEXIDINE GLUCONATE CLOTH 2 % EX PADS: 6.0000 | MEDICATED_PAD | Freq: Once | CUTANEOUS | Status: DC

## 2020-07-12 MED ORDER — FAMOTIDINE 20 MG PO TABS: 20.0000 mg | ORAL_TABLET | Freq: Once | ORAL | Status: AC

## 2020-07-12 MED ORDER — CHLORHEXIDINE GLUCONATE 0.12 % MT SOLN: 15.0000 mL | Freq: Once | OROMUCOSAL | Status: AC

## 2020-07-12 MED ORDER — CEFAZOLIN SODIUM-DEXTROSE 2-4 GM/100ML-% IV SOLN
2.0000 g | INTRAVENOUS | Status: AC
  Administered 2020-07-13: 2 g via INTRAVENOUS

## 2020-07-13 ENCOUNTER — Ambulatory Visit
Admission: RE | Admit: 2020-07-13 | Discharge: 2020-07-13 | Disposition: A | Discharge status: Home / Self Care | Payer: Medicaid Other | Attending: Surgery | Admitting: Surgery

## 2020-07-13 ENCOUNTER — Ambulatory Visit: Payer: Medicaid Other | Admitting: Urgent Care

## 2020-07-13 ENCOUNTER — Encounter: Payer: Self-pay | Admitting: Surgery

## 2020-07-13 ENCOUNTER — Other Ambulatory Visit: Payer: Self-pay

## 2020-07-13 ENCOUNTER — Encounter
Admission: RE | Disposition: A | Discharge status: Home / Self Care | Payer: Self-pay | Source: Home / Self Care | Attending: Surgery

## 2020-07-13 DIAGNOSIS — K8012 Calculus of gallbladder with acute and chronic cholecystitis without obstruction: Secondary | ICD-10-CM | POA: Insufficient documentation

## 2020-07-13 DIAGNOSIS — Z79899 Other long term (current) drug therapy: Secondary | ICD-10-CM | POA: Insufficient documentation

## 2020-07-13 DIAGNOSIS — K810 Acute cholecystitis: Secondary | ICD-10-CM

## 2020-07-13 DIAGNOSIS — Z7982 Long term (current) use of aspirin: Secondary | ICD-10-CM | POA: Insufficient documentation

## 2020-07-13 DIAGNOSIS — Z885 Allergy status to narcotic agent status: Secondary | ICD-10-CM | POA: Diagnosis not present

## 2020-07-13 DIAGNOSIS — Z87891 Personal history of nicotine dependence: Secondary | ICD-10-CM | POA: Insufficient documentation

## 2020-07-13 HISTORY — DX: Presence of automatic (implantable) cardiac defibrillator: Z95.810

## 2020-07-13 HISTORY — DX: Unspecified viral hepatitis B without hepatic coma: B19.10

## 2020-07-13 HISTORY — DX: Ischemic cardiomyopathy: I25.5

## 2020-07-13 SURGERY — CHOLECYSTECTOMY, ROBOT-ASSISTED, LAPAROSCOPIC
Anesthesia: General | Site: Abdomen

## 2020-07-13 MED ORDER — DEXMEDETOMIDINE (PRECEDEX) IN NS 20 MCG/5ML (4 MCG/ML) IV SYRINGE
PREFILLED_SYRINGE | INTRAVENOUS | Status: AC
  Filled 2020-07-13: qty 5

## 2020-07-13 MED ORDER — FAMOTIDINE 20 MG PO TABS
ORAL_TABLET | ORAL | Status: AC
  Administered 2020-07-13: 20 mg via ORAL
  Filled 2020-07-13: qty 1

## 2020-07-13 MED ORDER — ONDANSETRON HCL 4 MG/2ML IJ SOLN
INTRAMUSCULAR | Status: DC | PRN
  Administered 2020-07-13 (×2): 4 mg via INTRAVENOUS

## 2020-07-13 MED ORDER — MIDAZOLAM HCL 2 MG/2ML IJ SOLN
INTRAMUSCULAR | Status: DC | PRN
  Administered 2020-07-13: 1 mg via INTRAVENOUS

## 2020-07-13 MED ORDER — FENTANYL CITRATE (PF) 100 MCG/2ML IJ SOLN
INTRAMUSCULAR | Status: DC | PRN
  Administered 2020-07-13: 100 ug via INTRAVENOUS
  Administered 2020-07-13: 50 ug via INTRAVENOUS
  Administered 2020-07-13 (×2): 25 ug via INTRAVENOUS

## 2020-07-13 MED ORDER — BUPIVACAINE HCL (PF) 0.5 % IJ SOLN
INTRAMUSCULAR | Status: AC
  Filled 2020-07-13: qty 30

## 2020-07-13 MED ORDER — ACETAMINOPHEN 10 MG/ML IV SOLN
INTRAVENOUS | Status: AC
  Filled 2020-07-13: qty 100

## 2020-07-13 MED ORDER — FENTANYL CITRATE (PF) 100 MCG/2ML IJ SOLN
INTRAMUSCULAR | Status: AC
  Administered 2020-07-13: 25 ug via INTRAVENOUS
  Filled 2020-07-13: qty 2

## 2020-07-13 MED ORDER — DEXMEDETOMIDINE (PRECEDEX) IN NS 20 MCG/5ML (4 MCG/ML) IV SYRINGE
PREFILLED_SYRINGE | INTRAVENOUS | Status: DC | PRN
  Administered 2020-07-13: 4 ug via INTRAVENOUS
  Administered 2020-07-13: 6 ug via INTRAVENOUS
  Administered 2020-07-13: 4 ug via INTRAVENOUS
  Administered 2020-07-13: 6 ug via INTRAVENOUS

## 2020-07-13 MED ORDER — ETOMIDATE 2 MG/ML IV SOLN
INTRAVENOUS | Status: DC | PRN
  Administered 2020-07-13: 16 mg via INTRAVENOUS

## 2020-07-13 MED ORDER — ACETAMINOPHEN 10 MG/ML IV SOLN: INTRAVENOUS | Status: DC | PRN

## 2020-07-13 MED ORDER — ONDANSETRON HCL 4 MG/2ML IJ SOLN: 4.0000 mg | Freq: Once | INTRAMUSCULAR | Status: DC | PRN

## 2020-07-13 MED ORDER — SUGAMMADEX SODIUM 200 MG/2ML IV SOLN
INTRAVENOUS | Status: DC | PRN
  Administered 2020-07-13: 200 mg via INTRAVENOUS

## 2020-07-13 MED ORDER — BUPIVACAINE-EPINEPHRINE (PF) 0.5% -1:200000 IJ SOLN
INTRAMUSCULAR | Status: AC
  Filled 2020-07-13: qty 30

## 2020-07-13 MED ORDER — DEXAMETHASONE SODIUM PHOSPHATE 10 MG/ML IJ SOLN
INTRAMUSCULAR | Status: DC | PRN
  Administered 2020-07-13: 6 mg via INTRAVENOUS

## 2020-07-13 MED ORDER — PROPOFOL 10 MG/ML IV BOLUS
INTRAVENOUS | Status: AC
  Filled 2020-07-13: qty 20

## 2020-07-13 MED ORDER — CEFAZOLIN SODIUM-DEXTROSE 2-4 GM/100ML-% IV SOLN
INTRAVENOUS | Status: AC
  Filled 2020-07-13: qty 100

## 2020-07-13 MED ORDER — ETOMIDATE 2 MG/ML IV SOLN
INTRAVENOUS | Status: AC
  Filled 2020-07-13: qty 10

## 2020-07-13 MED ORDER — DOCUSATE CALCIUM 240 MG PO CAPS: 240.0000 mg | ORAL_CAPSULE | Freq: Every day | ORAL | 0 refills | Status: DC

## 2020-07-13 MED ORDER — LIDOCAINE-EPINEPHRINE 1 %-1:100000 IJ SOLN
INTRAMUSCULAR | Status: AC
  Filled 2020-07-13: qty 1

## 2020-07-13 MED ORDER — FENTANYL CITRATE (PF) 100 MCG/2ML IJ SOLN
INTRAMUSCULAR | Status: AC
  Filled 2020-07-13: qty 2

## 2020-07-13 MED ORDER — ROCURONIUM BROMIDE 100 MG/10ML IV SOLN
INTRAVENOUS | Status: DC | PRN
  Administered 2020-07-13: 10 mg via INTRAVENOUS
  Administered 2020-07-13: 65 mg via INTRAVENOUS
  Administered 2020-07-13: 30 mg via INTRAVENOUS
  Administered 2020-07-13: 15 mg via INTRAVENOUS
  Administered 2020-07-13: 10 mg via INTRAVENOUS

## 2020-07-13 MED ORDER — IBUPROFEN 800 MG PO TABS: 800.0000 mg | ORAL_TABLET | Freq: Three times a day (TID) | ORAL | 0 refills | Status: DC | PRN

## 2020-07-13 MED ORDER — MIDAZOLAM HCL 2 MG/2ML IJ SOLN
INTRAMUSCULAR | Status: AC
  Filled 2020-07-13: qty 2

## 2020-07-13 MED ORDER — OXYCODONE HCL 5 MG PO TABS: 5.0000 mg | ORAL_TABLET | Freq: Four times a day (QID) | ORAL | 0 refills | Status: DC | PRN

## 2020-07-13 MED ORDER — SEVOFLURANE IN SOLN
RESPIRATORY_TRACT | Status: AC
  Filled 2020-07-13: qty 250

## 2020-07-13 MED ORDER — LIDOCAINE HCL (PF) 1 % IJ SOLN
INTRAMUSCULAR | Status: AC
  Filled 2020-07-13: qty 30

## 2020-07-13 MED ORDER — DEXAMETHASONE SODIUM PHOSPHATE 10 MG/ML IJ SOLN
INTRAMUSCULAR | Status: AC
  Filled 2020-07-13: qty 1

## 2020-07-13 MED ORDER — ONDANSETRON HCL 4 MG/2ML IJ SOLN
INTRAMUSCULAR | Status: AC
  Filled 2020-07-13: qty 2

## 2020-07-13 MED ORDER — FENTANYL CITRATE (PF) 100 MCG/2ML IJ SOLN
25.0000 ug | INTRAMUSCULAR | Status: DC | PRN
  Administered 2020-07-13 (×5): 25 ug via INTRAVENOUS

## 2020-07-13 MED ORDER — LIDOCAINE-EPINEPHRINE (PF) 1 %-1:200000 IJ SOLN
INTRAMUSCULAR | Status: DC | PRN
  Administered 2020-07-13: 30 mL via SUBCUTANEOUS

## 2020-07-13 MED ORDER — LIDOCAINE HCL (CARDIAC) PF 100 MG/5ML IV SOSY
PREFILLED_SYRINGE | INTRAVENOUS | Status: DC | PRN
  Administered 2020-07-13: 100 mg via INTRAVENOUS

## 2020-07-13 MED ORDER — CHLORHEXIDINE GLUCONATE 0.12 % MT SOLN
OROMUCOSAL | Status: AC
  Administered 2020-07-13: 15 mL via OROMUCOSAL
  Filled 2020-07-13: qty 15

## 2020-07-13 SURGICAL SUPPLY — 74 items
"PENCIL ELECTRO HAND CTR " (MISCELLANEOUS) ×2 IMPLANT
ADH SKN CLS APL DERMABOND .7 (GAUZE/BANDAGES/DRESSINGS) ×2
ANCHOR TIS RET SYS 235ML (MISCELLANEOUS) ×4 IMPLANT
APL PRP STRL LF DISP 70% ISPRP (MISCELLANEOUS) ×2
BAG INFUSER PRESSURE 100CC (MISCELLANEOUS) ×2 IMPLANT
BAG TISS RTRVL C235 10X14 (MISCELLANEOUS) ×2
BLADE SURG SZ11 CARB STEEL (BLADE) ×4 IMPLANT
BULB RESERV EVAC DRAIN JP 100C (MISCELLANEOUS) ×2 IMPLANT
CANISTER SUCT 1200ML W/VALVE (MISCELLANEOUS) ×4 IMPLANT
CANNULA REDUC XI 12-8 STAPL (CANNULA) ×3
CANNULA REDUC XI 12-8MM STAPL (CANNULA) ×1
CANNULA REDUCER 12-8 DVNC XI (CANNULA) ×2 IMPLANT
CATH REDDICK CHOLANGI 4FR 50CM (CATHETERS) IMPLANT
CHLORAPREP W/TINT 26 (MISCELLANEOUS) ×4 IMPLANT
CLIP VESOLOCK MED LG 6/CT (CLIP) ×4 IMPLANT
COVER TIP SHEARS 8 DVNC (MISCELLANEOUS) ×2 IMPLANT
COVER TIP SHEARS 8MM DA VINCI (MISCELLANEOUS) ×4
COVER WAND RF STERILE (DRAPES) ×4 IMPLANT
DECANTER SPIKE VIAL GLASS SM (MISCELLANEOUS) ×8 IMPLANT
DEFOGGER SCOPE WARMER CLEARIFY (MISCELLANEOUS) ×4 IMPLANT
DERMABOND ADVANCED (GAUZE/BANDAGES/DRESSINGS) ×2
DERMABOND ADVANCED .7 DNX12 (GAUZE/BANDAGES/DRESSINGS) ×2 IMPLANT
DRAIN CHANNEL JP 15F RND 16 (MISCELLANEOUS) ×2 IMPLANT
DRAPE ARM DVNC X/XI (DISPOSABLE) ×8 IMPLANT
DRAPE C-ARM XRAY 36X54 (DRAPES) IMPLANT
DRAPE COLUMN DVNC XI (DISPOSABLE) ×2 IMPLANT
DRAPE DA VINCI XI ARM (DISPOSABLE) ×16
DRAPE DA VINCI XI COLUMN (DISPOSABLE) ×4
DRSG OPSITE POSTOP 3X4 (GAUZE/BANDAGES/DRESSINGS) ×6 IMPLANT
DRSG TEGADERM 4X4.75 (GAUZE/BANDAGES/DRESSINGS) ×2 IMPLANT
ELECT CAUTERY BLADE 6.4 (BLADE) ×4 IMPLANT
ELECT REM PT RETURN 9FT ADLT (ELECTROSURGICAL) ×4
ELECTRODE REM PT RTRN 9FT ADLT (ELECTROSURGICAL) ×2 IMPLANT
GLOVE SURG SYN 6.5 ES PF (GLOVE) ×8 IMPLANT
GLOVE SURG SYN 6.5 PF PI (GLOVE) ×4 IMPLANT
GLOVE SURG UNDER POLY LF SZ7 (GLOVE) ×8 IMPLANT
GOWN STRL REUS W/ TWL LRG LVL3 (GOWN DISPOSABLE) ×6 IMPLANT
GOWN STRL REUS W/TWL LRG LVL3 (GOWN DISPOSABLE) ×12
GRASPER SUT TROCAR 14GX15 (MISCELLANEOUS) IMPLANT
HANDLE YANKAUER SUCT BULB TIP (MISCELLANEOUS) ×2 IMPLANT
IRRIGATOR SUCT 8 DISP DVNC XI (IRRIGATION / IRRIGATOR) IMPLANT
IRRIGATOR SUCTION 8MM XI DISP (IRRIGATION / IRRIGATOR) ×4
IV NS 1000ML (IV SOLUTION) ×4
IV NS 1000ML BAXH (IV SOLUTION) IMPLANT
LABEL OR SOLS (LABEL) ×4 IMPLANT
MANIFOLD NEPTUNE II (INSTRUMENTS) ×4 IMPLANT
NDL INSUFFLATION 14GA 120MM (NEEDLE) ×2 IMPLANT
NEEDLE HYPO 22GX1.5 SAFETY (NEEDLE) ×4 IMPLANT
NEEDLE INSUFFLATION 14GA 120MM (NEEDLE) ×4 IMPLANT
NS IRRIG 500ML POUR BTL (IV SOLUTION) ×4 IMPLANT
OBTURATOR OPTICAL STANDARD 8MM (TROCAR) ×4
OBTURATOR OPTICAL STND 8 DVNC (TROCAR) ×2
OBTURATOR OPTICALSTD 8 DVNC (TROCAR) ×2 IMPLANT
PACK LAP CHOLECYSTECTOMY (MISCELLANEOUS) ×4 IMPLANT
PENCIL ELECTRO HAND CTR (MISCELLANEOUS) ×4 IMPLANT
SEAL CANN UNIV 5-8 DVNC XI (MISCELLANEOUS) ×6 IMPLANT
SEAL XI 5MM-8MM UNIVERSAL (MISCELLANEOUS) ×12
SET TUBE SMOKE EVAC HIGH FLOW (TUBING) ×4 IMPLANT
SOLUTION ELECTROLUBE (MISCELLANEOUS) ×4 IMPLANT
SPONGE DRAIN TRACH 4X4 STRL 2S (GAUZE/BANDAGES/DRESSINGS) ×2 IMPLANT
STAPLER CANNULA SEAL DVNC XI (STAPLE) ×2 IMPLANT
STAPLER CANNULA SEAL XI (STAPLE) ×4
STAPLER SKIN PROX 35W (STAPLE) ×2 IMPLANT
SUT ETHILON 3 0 PS 1 (SUTURE) ×2 IMPLANT
SUT ETHILON 4-0 (SUTURE) ×4
SUT ETHILON 4-0 FS2 18XMFL BLK (SUTURE) ×2
SUT MNCRL 4-0 (SUTURE) ×8
SUT MNCRL 4-0 27XMFL (SUTURE) ×4
SUT VIC AB 3-0 SH 27 (SUTURE) ×4
SUT VIC AB 3-0 SH 27X BRD (SUTURE) IMPLANT
SUT VICRYL 0 AB UR-6 (SUTURE) ×8 IMPLANT
SUTURE ETHLN 4-0 FS2 18XMF BLK (SUTURE) IMPLANT
SUTURE MNCRL 4-0 27XMF (SUTURE) ×4 IMPLANT
SYR 30ML LL (SYRINGE) IMPLANT

## 2020-07-13 NOTE — Transfer of Care (Signed)
Immediate Anesthesia Transfer of Care Note  Patient: Brent [name]  Procedure(s) Performed: XI ROBOTIC ASSISTED LAPAROSCOPIC CHOLECYSTECTOMY (N/A Abdomen) INDOCYANINE GREEN FLUORESCENCE IMAGING (ICG)  Patient Location: PACU  Anesthesia Type:General  Level of Consciousness: drowsy  Airway & Oxygen Therapy: Patient Spontanous Breathing and Patient connected to face mask oxygen  Post-op Assessment: Report given to RN and Post -op Vital signs reviewed and stable  Post vital signs: Reviewed and stable  Last Vitals:  Vitals Value Taken Time  BP 132/85 07/13/20 1848  Temp    Pulse 82 07/13/20 1855  Resp 12 07/13/20 1855  SpO2 99 % 07/13/20 1855  Vitals shown include unvalidated device data.  Last Pain:  Vitals:   07/13/20 1337  TempSrc: Oral  PainSc: 0-No pain         Complications: No complications documented.
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
## 2020-07-13 NOTE — Interval H&P Note (Signed)
History and Physical Interval Note:  07/13/2020 1:34 PM  Brent [name]  has presented today for surgery, with the diagnosis of K81.0 Acute Cholecystitis.  The various methods of treatment have been discussed with the patient and family. After consideration of risks, benefits and other options for treatment, the patient has consented to  Procedure(s): XI ROBOTIC ASSISTED LAPAROSCOPIC CHOLECYSTECTOMY (N/A) as a surgical intervention.  The patient's history has been reviewed, patient examined, no change in status, stable for surgery.  I have reviewed the patient's chart and labs.  Questions were answered to the patient's satisfaction.     Khaza Blansett Tonna Boehringer

## 2020-07-13 NOTE — Discharge Instructions (Signed)
Laparoscopic Cholecystectomy, Care After This sheet gives you information about how to care for yourself after your procedure. Your doctor may also give you more specific instructions. If you have problems or questions, contact your doctor. Follow these instructions at home: Care for cuts from surgery (incisions)   Follow instructions from your doctor about how to take care of your cuts from surgery. Make sure you: ? Wash your hands with soap and water before you change your bandage (dressing). If you cannot use soap and water, use hand sanitizer. ? Change your bandage as told by your doctor. ? Leave stitches (sutures), skin glue, or skin tape (adhesive) strips in place. They may need to stay in place for 2 weeks or longer. If tape strips get loose and curl up, you may trim the loose edges. Do not remove tape strips completely unless your doctor says it is okay.  Do not take baths, swim, or use a hot tub until your doctor says it is okay. OK TO SHOWER 24HRS AFTER YOUR SURGERY.   Check your surgical cut area every day for signs of infection. Check for: ? More redness, swelling, or pain. ? More fluid or blood. ? Warmth. ? Pus or a bad smell. Activity  Do not drive or use heavy machinery while taking prescription pain medicine.  Do not play contact sports until your doctor says it is okay.  Do not drive for 24 hours if you were given a medicine to help you relax (sedative).  Rest as needed. Do not return to work or school until your doctor says it is okay. General instructions . RESUME ASPIRIN IN 48HRS . MEASURE OUTPUT FROM DRAIN DAILY. . OK TO REMOVE DRESSINGS IN 48HRS AND SHOWER.  KEEP DRAIN AREA COVERED WITH GAUZE AND CHANGE DAILY UNTIL SEEN IN OFFICE  . tylenol and advil as needed for discomfort.  Please alternate between the two every four hours as needed for pain.   .  Use narcotics, if prescribed, only when tylenol and motrin is not enough to control pain. .  325-650mg  every 8hrs  to max of 3000mg /24hrs (including the 325mg  in every norco dose) for the tylenol.   .  Advil up to 800mg  per dose every 8hrs as needed for pain.    To prevent or treat constipation while you are taking prescription pain medicine, your doctor may recommend that you: ? Drink enough fluid to keep your pee (urine) clear or pale yellow. ? Take over-the-counter or prescription medicines. ? Eat foods that are high in fiber, such as fresh fruits and vegetables, whole grains, and beans. ? Limit foods that are high in fat and processed sugars, such as fried and sweet foods. Contact a doctor if:  You develop a rash.  You have more redness, swelling, or pain around your surgical cuts.  You have more fluid or blood coming from your surgical cuts.  Your surgical cuts feel warm to the touch.  You have pus or a bad smell coming from your surgical cuts.  You have a fever.  One or more of your surgical cuts breaks open. Get help right away if:  You have trouble breathing.  You have chest pain.  You have pain that is getting worse in your shoulders.  You faint or feel dizzy when you stand.  You have very bad pain in your belly (abdomen).  You are sick to your stomach (nauseous) for more than one day.  You have throwing up (vomiting) that lasts for more  than one day.  You have leg pain. This information is not intended to replace advice given to you by your health care provider. Make sure you discuss any questions you have with your health care provider. Document Released: 11/01/2007 Document Revised: 08/13/2015 Document Reviewed: 07/11/2015 Elsevier Interactive Patient Education  2019 [employer].

## 2020-07-13 NOTE — Anesthesia Procedure Notes (Signed)
Procedure Name: Intubation Date/Time: 07/13/2020 2:53 PM Performed by: Henrietta Hoover, CRNA Pre-anesthesia Checklist: Patient identified, Patient being monitored, Timeout performed, Emergency Drugs available and Suction available Patient Re-evaluated:Patient Re-evaluated prior to induction Oxygen Delivery Method: Circle system utilized Preoxygenation: Pre-oxygenation with 100% oxygen Induction Type: IV induction Ventilation: Mask ventilation without difficulty and Oral airway inserted - appropriate to patient size Laryngoscope Size: 4 and McGraph Grade View: Grade I Tube type: Oral Tube size: 7.5 mm Number of attempts: 1 Airway Equipment and Method: Stylet Placement Confirmation: ETT inserted through vocal cords under direct vision,  positive ETCO2 and breath sounds checked- equal and bilateral Secured at: 24 cm Tube secured with: Tape Dental Injury: Teeth and Oropharynx as per pre-operative assessment

## 2020-07-13 NOTE — Anesthesia Postprocedure Evaluation (Signed)
Anesthesia Post Note  Patient: Brent [name]  Procedure(s) Performed: XI ROBOTIC ASSISTED LAPAROSCOPIC CHOLECYSTECTOMY (N/A Abdomen) INDOCYANINE GREEN FLUORESCENCE IMAGING (ICG)  Patient location during evaluation: PACU Anesthesia Type: General Level of consciousness: awake and alert Pain management: pain level controlled Vital Signs Assessment: post-procedure vital signs reviewed and stable Respiratory status: spontaneous breathing and respiratory function stable Cardiovascular status: stable Anesthetic complications: no   No complications documented.   Last Vitals:  Vitals:   07/13/20 2000 07/13/20 2015  BP: 105/76 95/67  Pulse: 68 66  Resp: (!) 8 14  Temp:    SpO2: 94% 91%    Last Pain:  Vitals:   07/13/20 2015  TempSrc:   PainSc: 4                  Damiya Sandefur K

## 2020-07-14 ENCOUNTER — Encounter: Payer: Self-pay | Admitting: Surgery

## 2020-07-14 NOTE — Op Note (Signed)
Preoperative diagnosis:  chronic and cholecystitis  Postoperative diagnosis: same as above  Procedure: Robotic assisted Laparoscopic Cholecystectomy.   Anesthesia: GETA   Surgeon: Sung Amabile  Specimen: Gallbladder  Complications: None  EBL: 43mL  Wound Classification: Clean Contaminated  Indications: see HPI  Findings: Critical view of safety noted Cystic duct and artery identified, ligated and divided, clips remained intact at end of procedure Adequate hemostasis  Description of procedure:  The patient was placed on the operating table in the supine position. SCDs placed, pre-op abx administered.  General anesthesia was induced and OG tube placed by anesthesia. A time-out was completed verifying correct patient, procedure, site, positioning, and implant(s) and/or special equipment prior to beginning this procedure. The abdomen was prepped and draped in the usual sterile fashion.    Veress needle was placed at the Palmer's point and insufflation was started after confirming a positive saline drop test and no immediate increase in abdominal pressure.  After reaching 15 mm, the Veress needle was removed and a 8 mm port was placed via optiview technique under umbilicus measured 47mm from gallbladder.  The abdomen was inspected and no abnormalities or injuries were found.  Under direct vision, ports were placed in the following locations: One 12 mm patient left of the umbilicus, 8cm from the optiviewed port, one 8 mm port placed to the patient right of the umbilical port 8 cm apart.  1 additional 8 mm port placed lateral to the 66mm port.  Once ports were placed, The table was placed in the reverse Trendelenburg position with the right side up. The Xi platform was brought into the operative field and docked to the ports successfully.  An endoscope was placed through the umbilical port, fenestrated grasper through the adjacent patient right port, prograsp to the far patient left port, and  then a hook cautery in the left port.  Extensive omental attachments to the gallbladder was removed meticulously via hook cautery.  Choelcystostomy tube was removed half way the lysis of adhesions to facilitate better expsore. The dome of the gallbladder was eventually visualized and grasped with prograsp, passed and retracted over the dome of the liver. Adhesions between the gallbladder and omentum, duodenum and transverse colon was continued via hook cautery and maryland dissector toward the infundibulum. The infundibulum was grasped with the fenestrated grasper and retracted toward the right lower quadrant. This maneuver exposed the area of supposed Calot's triangle. The peritoneum overlying the gallbladder infundibulum was then dissected using combination of Maryland dissector and electrocautery hook and the cystic duct and cystic artery eventually identified. Dissection had to be completed along the common bile duct after a top down dissection of the gallbladder off the fossa in order to visualize the cystic duct within the thickened and still very inflammed gallbladder. ICG used to visualize structures.   Critical view of safety with the liver bed clearly visible behind the duct and artery with no additional structures noted.  The cystic duct and cystic artery clipped and divided close to the gallbladder.  Hemostasis was checked prior to removing the hook cautery and the Endo Catch bag was then placed through the 12 mm port and the gallbladder was removed.  The gallbladder was passed off the table as a specimen. There was no evidence of bleeding from the gallbladder fossa or cystic artery or leakage of the bile from the cystic duct stump. 15Fr Blake drain placed through RLQ port, secured to skin using 3-0 nylon, and Abdomen desufflated and secondary trocars were removed  under direct vision. The 12 mm port site that was extended in order to remove the large specimen was closed with running 0 vicryl under  direct vision.   No bleeding was noted. All skin incisions then closed with staples due to heavy leakage of infected gallbladder contents during dissection.  The orogastric tube was removed and patient extubated.  The patient tolerated the procedure well and was taken to the postanesthesia care unit in stable condition.  All sponge and instrument count correct at end of procedure.

## 2020-07-15 LAB — SURGICAL PATHOLOGY

## 2020-08-01 ENCOUNTER — Ambulatory Visit: Payer: Medicaid Other | Admitting: Family

## 2020-08-09 ENCOUNTER — Ambulatory Visit: Payer: Medicaid Other | Attending: Family | Admitting: Family

## 2020-08-09 ENCOUNTER — Encounter: Payer: Self-pay | Admitting: Family

## 2020-08-09 ENCOUNTER — Other Ambulatory Visit: Payer: Self-pay

## 2020-08-09 VITALS — BP 111/82 | HR 69 | Resp 14 | Ht 72.0 in | Wt 191.5 lb

## 2020-08-09 DIAGNOSIS — I509 Heart failure, unspecified: Secondary | ICD-10-CM | POA: Insufficient documentation

## 2020-08-09 DIAGNOSIS — Z7951 Long term (current) use of inhaled steroids: Secondary | ICD-10-CM | POA: Diagnosis not present

## 2020-08-09 DIAGNOSIS — Z95810 Presence of automatic (implantable) cardiac defibrillator: Secondary | ICD-10-CM | POA: Insufficient documentation

## 2020-08-09 DIAGNOSIS — K7460 Unspecified cirrhosis of liver: Secondary | ICD-10-CM | POA: Insufficient documentation

## 2020-08-09 DIAGNOSIS — E785 Hyperlipidemia, unspecified: Secondary | ICD-10-CM | POA: Insufficient documentation

## 2020-08-09 DIAGNOSIS — Z791 Long term (current) use of non-steroidal anti-inflammatories (NSAID): Secondary | ICD-10-CM | POA: Diagnosis not present

## 2020-08-09 DIAGNOSIS — B1910 Unspecified viral hepatitis B without hepatic coma: Secondary | ICD-10-CM | POA: Insufficient documentation

## 2020-08-09 DIAGNOSIS — I255 Ischemic cardiomyopathy: Secondary | ICD-10-CM | POA: Diagnosis not present

## 2020-08-09 DIAGNOSIS — I130 Hypertensive heart and chronic kidney disease with heart failure and stage 1 through stage 4 chronic kidney disease, or unspecified chronic kidney disease: Secondary | ICD-10-CM | POA: Diagnosis not present

## 2020-08-09 DIAGNOSIS — I2510 Atherosclerotic heart disease of native coronary artery without angina pectoris: Secondary | ICD-10-CM | POA: Diagnosis not present

## 2020-08-09 DIAGNOSIS — I5022 Chronic systolic (congestive) heart failure: Secondary | ICD-10-CM

## 2020-08-09 DIAGNOSIS — Z79899 Other long term (current) drug therapy: Secondary | ICD-10-CM | POA: Insufficient documentation

## 2020-08-09 DIAGNOSIS — I252 Old myocardial infarction: Secondary | ICD-10-CM | POA: Diagnosis not present

## 2020-08-09 DIAGNOSIS — Z87891 Personal history of nicotine dependence: Secondary | ICD-10-CM | POA: Diagnosis not present

## 2020-08-09 DIAGNOSIS — J449 Chronic obstructive pulmonary disease, unspecified: Secondary | ICD-10-CM | POA: Diagnosis not present

## 2020-08-09 DIAGNOSIS — B1920 Unspecified viral hepatitis C without hepatic coma: Secondary | ICD-10-CM | POA: Insufficient documentation

## 2020-08-09 DIAGNOSIS — N189 Chronic kidney disease, unspecified: Secondary | ICD-10-CM | POA: Insufficient documentation

## 2020-08-09 DIAGNOSIS — B182 Chronic viral hepatitis C: Secondary | ICD-10-CM

## 2020-08-09 DIAGNOSIS — I10 Essential (primary) hypertension: Secondary | ICD-10-CM

## 2020-08-09 DIAGNOSIS — I472 Ventricular tachycardia, unspecified: Secondary | ICD-10-CM

## 2020-08-09 DIAGNOSIS — K819 Cholecystitis, unspecified: Secondary | ICD-10-CM
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
## 2020-08-09 NOTE — Patient Instructions (Signed)
Continue weighing daily and call for an overnight weight gain of > 2 pounds or a weekly weight gain of >5 pounds. 

## 2020-08-27 ENCOUNTER — Other Ambulatory Visit: Payer: Self-pay | Admitting: Family

## 2020-09-23 ENCOUNTER — Other Ambulatory Visit: Payer: Self-pay | Admitting: Family

## 2020-09-23 ENCOUNTER — Encounter: Payer: Self-pay | Admitting: Family

## 2020-09-23 MED ORDER — SPIRONOLACTONE 25 MG PO TABS: 12.5000 mg | ORAL_TABLET | Freq: Every day | ORAL | 3 refills | Status: DC

## 2020-09-23 MED ORDER — ISOSORBIDE MONONITRATE ER 30 MG PO TB24: 30.0000 mg | ORAL_TABLET | Freq: Every day | ORAL | 3 refills | Status: DC

## 2020-09-27 ENCOUNTER — Other Ambulatory Visit: Payer: Self-pay | Admitting: Family

## 2020-12-22 ENCOUNTER — Other Ambulatory Visit: Payer: Self-pay | Admitting: Family

## 2020-12-22 MED ORDER — SACUBITRIL-VALSARTAN 49-51 MG PO TABS: 1.0000 | ORAL_TABLET | Freq: Two times a day (BID) | ORAL | 3 refills | Status: DC

## 2020-12-22 NOTE — Progress Notes (Signed)
Entresto RX sent to [facility]

## 2020-12-28 ENCOUNTER — Other Ambulatory Visit: Payer: Self-pay | Admitting: Family

## 2020-12-28 ENCOUNTER — Encounter: Payer: Self-pay | Admitting: Family

## 2020-12-28 MED ORDER — CARVEDILOL 3.125 MG PO TABS: 3.1250 mg | ORAL_TABLET | Freq: Two times a day (BID) | ORAL | 3 refills | Status: DC

## 2020-12-28 MED ORDER — EMPAGLIFLOZIN 10 MG PO TABS: ORAL_TABLET | ORAL | 3 refills | Status: DC

## 2020-12-28 NOTE — Progress Notes (Signed)
Jardiance and cavedilol RX sent in

## 2021-01-19 ENCOUNTER — Telehealth: Payer: Self-pay

## 2021-01-19 NOTE — Telephone Encounter (Signed)
Patient called our office stating that his pharmacy informed him that his medicaid insurance is denying coverage for jardiance. Patient didn't know the reason for denial. Patient is completely out of jardiance.  Patient asked to call his insurance and verify the reason for denial of coverage to see if it's something we can help him with or an error. Informed patient that he we will give him samples of jardiance to sustain him until this issue can be resolved, and that if insurance will not cover, we will have him fill out paperwork for patient assistance to receive jardiance in the future. Patient plans to stop by office tomorrow, 12/16, to pick up samples and begin pt assistance paperwork. Suanne Marker, RN

## 2021-02-09 ENCOUNTER — Ambulatory Visit: Payer: Medicaid Other | Admitting: Family
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
## 2021-02-15 ENCOUNTER — Encounter: Payer: Self-pay | Admitting: Pharmacist

## 2021-02-15 ENCOUNTER — Encounter: Payer: Self-pay | Admitting: Family

## 2021-02-15 ENCOUNTER — Other Ambulatory Visit: Payer: Self-pay

## 2021-02-15 ENCOUNTER — Ambulatory Visit: Payer: Medicaid Other | Attending: Family | Admitting: Family

## 2021-02-15 VITALS — BP 123/78 | HR 60 | Resp 20 | Ht 72.0 in | Wt 220.5 lb

## 2021-02-15 DIAGNOSIS — I5022 Chronic systolic (congestive) heart failure: Secondary | ICD-10-CM

## 2021-02-15 DIAGNOSIS — B182 Chronic viral hepatitis C: Secondary | ICD-10-CM

## 2021-02-15 DIAGNOSIS — E785 Hyperlipidemia, unspecified: Secondary | ICD-10-CM | POA: Insufficient documentation

## 2021-02-15 DIAGNOSIS — Z79899 Other long term (current) drug therapy: Secondary | ICD-10-CM | POA: Insufficient documentation

## 2021-02-15 DIAGNOSIS — B1910 Unspecified viral hepatitis B without hepatic coma: Secondary | ICD-10-CM | POA: Diagnosis not present

## 2021-02-15 DIAGNOSIS — I10 Essential (primary) hypertension: Secondary | ICD-10-CM | POA: Diagnosis not present

## 2021-02-15 DIAGNOSIS — F17210 Nicotine dependence, cigarettes, uncomplicated: Secondary | ICD-10-CM | POA: Diagnosis not present

## 2021-02-15 DIAGNOSIS — F32A Depression, unspecified: Secondary | ICD-10-CM | POA: Insufficient documentation

## 2021-02-15 DIAGNOSIS — K7460 Unspecified cirrhosis of liver: Secondary | ICD-10-CM | POA: Diagnosis not present

## 2021-02-15 DIAGNOSIS — N189 Chronic kidney disease, unspecified: Secondary | ICD-10-CM | POA: Diagnosis not present

## 2021-02-15 DIAGNOSIS — I130 Hypertensive heart and chronic kidney disease with heart failure and stage 1 through stage 4 chronic kidney disease, or unspecified chronic kidney disease: Secondary | ICD-10-CM | POA: Insufficient documentation

## 2021-02-15 DIAGNOSIS — M4800 Spinal stenosis, site unspecified: Secondary | ICD-10-CM | POA: Insufficient documentation

## 2021-02-15 DIAGNOSIS — B1920 Unspecified viral hepatitis C without hepatic coma: Secondary | ICD-10-CM | POA: Insufficient documentation

## 2021-02-15 DIAGNOSIS — I472 Ventricular tachycardia, unspecified: Secondary | ICD-10-CM | POA: Diagnosis not present

## 2021-02-15 DIAGNOSIS — J449 Chronic obstructive pulmonary disease, unspecified: Secondary | ICD-10-CM | POA: Diagnosis not present

## 2021-02-15 DIAGNOSIS — F329 Major depressive disorder, single episode, unspecified: Secondary | ICD-10-CM

## 2021-02-15 MED ORDER — ENTRESTO 97-103 MG PO TABS: 1.0000 | ORAL_TABLET | Freq: Two times a day (BID) | ORAL | 3 refills | Status: DC

## 2021-02-15 NOTE — Patient Instructions (Addendum)
Continue weighing daily and call for an overnight weight gain of 3 pounds or more or a weekly weight gain of more than 5 pounds.     We are moving mid February to [id number]
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
## 2021-04-10 ENCOUNTER — Other Ambulatory Visit: Payer: Self-pay | Admitting: Family

## 2021-04-12 ENCOUNTER — Other Ambulatory Visit: Payer: Self-pay | Admitting: Family

## 2021-04-12 ENCOUNTER — Encounter: Payer: Self-pay | Admitting: Family

## 2021-04-12 MED ORDER — NICOTINE 21 MG/24HR TD PT24: 21.0000 mg | MEDICATED_PATCH | Freq: Every day | TRANSDERMAL | 5 refills | Status: DC

## 2021-04-12 NOTE — Progress Notes (Signed)
Patient called requesting nicotine patches as he has resumed smoking.  ?

## 2021-04-21 ENCOUNTER — Encounter: Payer: Self-pay | Admitting: Family

## 2021-06-30 ENCOUNTER — Encounter: Payer: Self-pay | Admitting: Family

## 2021-07-02 ENCOUNTER — Other Ambulatory Visit: Payer: Self-pay | Admitting: Family

## 2021-07-02 MED ORDER — ENTRESTO 97-103 MG PO TABS: 1.0000 | ORAL_TABLET | Freq: Two times a day (BID) | ORAL | 3 refills | Status: DC

## 2021-07-02 MED ORDER — ENTRESTO 97-103 MG PO TABS: 1.0000 | ORAL_TABLET | Freq: Two times a day (BID) | ORAL | 3 refills | Status: AC

## 2021-08-10 ENCOUNTER — Other Ambulatory Visit: Payer: Self-pay | Admitting: Family
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
## 2021-08-14 ENCOUNTER — Ambulatory Visit: Payer: Medicaid Other | Admitting: Family

## 2021-08-14 ENCOUNTER — Telehealth: Payer: Self-pay | Admitting: Family

## 2021-08-14 NOTE — Telephone Encounter (Signed)
Patient did not show for his Heart Failure Clinic appointment on 08/14/21. Will attempt to reschedule.

## 2021-09-18 ENCOUNTER — Other Ambulatory Visit: Payer: Self-pay | Admitting: Family

## 2021-09-19 ENCOUNTER — Encounter: Payer: Self-pay | Admitting: Family

## 2021-10-06 ENCOUNTER — Other Ambulatory Visit: Payer: Self-pay | Admitting: Family

## 2021-10-12 ENCOUNTER — Encounter: Payer: Self-pay | Admitting: Family

## 2021-10-12 ENCOUNTER — Ambulatory Visit: Payer: Medicaid Other | Attending: Family | Admitting: Family

## 2021-10-12 VITALS — BP 99/67 | HR 60 | Resp 20 | Ht 72.0 in | Wt 194.1 lb

## 2021-10-12 DIAGNOSIS — F32A Depression, unspecified: Secondary | ICD-10-CM | POA: Diagnosis not present

## 2021-10-12 DIAGNOSIS — I5022 Chronic systolic (congestive) heart failure: Secondary | ICD-10-CM | POA: Diagnosis not present

## 2021-10-12 DIAGNOSIS — B182 Chronic viral hepatitis C: Secondary | ICD-10-CM

## 2021-10-12 DIAGNOSIS — I10 Essential (primary) hypertension: Secondary | ICD-10-CM

## 2021-10-12 DIAGNOSIS — K7460 Unspecified cirrhosis of liver: Secondary | ICD-10-CM | POA: Insufficient documentation

## 2021-10-12 DIAGNOSIS — B1910 Unspecified viral hepatitis B without hepatic coma: Secondary | ICD-10-CM | POA: Diagnosis not present

## 2021-10-12 DIAGNOSIS — I130 Hypertensive heart and chronic kidney disease with heart failure and stage 1 through stage 4 chronic kidney disease, or unspecified chronic kidney disease: Secondary | ICD-10-CM | POA: Insufficient documentation

## 2021-10-12 DIAGNOSIS — I472 Ventricular tachycardia, unspecified: Secondary | ICD-10-CM | POA: Diagnosis not present

## 2021-10-12 DIAGNOSIS — Z95810 Presence of automatic (implantable) cardiac defibrillator: Secondary | ICD-10-CM | POA: Insufficient documentation

## 2021-10-12 DIAGNOSIS — F329 Major depressive disorder, single episode, unspecified: Secondary | ICD-10-CM

## 2021-10-12 DIAGNOSIS — J449 Chronic obstructive pulmonary disease, unspecified: Secondary | ICD-10-CM | POA: Diagnosis not present

## 2021-10-12 DIAGNOSIS — B1920 Unspecified viral hepatitis C without hepatic coma: Secondary | ICD-10-CM | POA: Diagnosis not present

## 2021-10-12 DIAGNOSIS — G8929 Other chronic pain: Secondary | ICD-10-CM | POA: Insufficient documentation

## 2021-10-12 DIAGNOSIS — E785 Hyperlipidemia, unspecified: Secondary | ICD-10-CM | POA: Insufficient documentation

## 2021-10-12 DIAGNOSIS — M4800 Spinal stenosis, site unspecified: Secondary | ICD-10-CM | POA: Insufficient documentation

## 2021-10-12 DIAGNOSIS — I609 Nontraumatic subarachnoid hemorrhage, unspecified: Secondary | ICD-10-CM | POA: Diagnosis not present

## 2021-10-12 DIAGNOSIS — Z87891 Personal history of nicotine dependence: Secondary | ICD-10-CM | POA: Diagnosis not present

## 2021-10-12 MED ORDER — CARVEDILOL 3.125 MG PO TABS: 3.1250 mg | ORAL_TABLET | Freq: Two times a day (BID) | ORAL | 3 refills | Status: DC
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
## 2021-10-12 NOTE — Patient Instructions (Addendum)
Continue weighing daily and call for an overnight weight gain of 3 pounds or more or a weekly weight gain of more than 5 pounds.   If you have voicemail, please make sure your mailbox is cleaned out so that we may leave a message and please make sure to listen to any voicemails.    If you receive a satisfaction survey regarding the Heart Failure Clinic, please take the time to fill it out. This way we can continue to provide excellent care and make any changes that need to be made.     Call your primary care doctor and get an appointment scheduled.

## 2021-11-01 ENCOUNTER — Ambulatory Visit: Payer: Medicaid Other | Admitting: Family

## 2021-12-21 ENCOUNTER — Encounter: Payer: Self-pay | Admitting: Family

## 2021-12-21 ENCOUNTER — Other Ambulatory Visit: Payer: Self-pay | Admitting: Family

## 2021-12-21 MED ORDER — NICOTINE 21 MG/24HR TD PT24: 21.0000 mg | MEDICATED_PATCH | Freq: Every day | TRANSDERMAL | 5 refills | Status: DC

## 2021-12-21 NOTE — Progress Notes (Signed)
Nicotine patch refilled

## 2022-01-01 ENCOUNTER — Other Ambulatory Visit: Payer: Self-pay | Admitting: Family

## 2022-04-24 ENCOUNTER — Ambulatory Visit: Payer: Medicaid Other | Attending: Family | Admitting: Family

## 2022-04-24 ENCOUNTER — Encounter: Payer: Self-pay | Admitting: Family

## 2022-04-24 VITALS — BP 129/84 | HR 76 | Wt 206.4 lb

## 2022-04-24 DIAGNOSIS — I472 Ventricular tachycardia, unspecified: Secondary | ICD-10-CM

## 2022-04-24 DIAGNOSIS — J449 Chronic obstructive pulmonary disease, unspecified: Secondary | ICD-10-CM | POA: Diagnosis not present

## 2022-04-24 DIAGNOSIS — B1910 Unspecified viral hepatitis B without hepatic coma: Secondary | ICD-10-CM | POA: Insufficient documentation

## 2022-04-24 DIAGNOSIS — Z87891 Personal history of nicotine dependence: Secondary | ICD-10-CM | POA: Insufficient documentation

## 2022-04-24 DIAGNOSIS — I10 Essential (primary) hypertension: Secondary | ICD-10-CM

## 2022-04-24 DIAGNOSIS — Z95810 Presence of automatic (implantable) cardiac defibrillator: Secondary | ICD-10-CM | POA: Diagnosis not present

## 2022-04-24 DIAGNOSIS — I509 Heart failure, unspecified: Secondary | ICD-10-CM | POA: Insufficient documentation

## 2022-04-24 DIAGNOSIS — F329 Major depressive disorder, single episode, unspecified: Secondary | ICD-10-CM

## 2022-04-24 DIAGNOSIS — K7460 Unspecified cirrhosis of liver: Secondary | ICD-10-CM | POA: Insufficient documentation

## 2022-04-24 DIAGNOSIS — Z79899 Other long term (current) drug therapy: Secondary | ICD-10-CM | POA: Insufficient documentation

## 2022-04-24 DIAGNOSIS — F32A Depression, unspecified: Secondary | ICD-10-CM | POA: Insufficient documentation

## 2022-04-24 DIAGNOSIS — B182 Chronic viral hepatitis C: Secondary | ICD-10-CM

## 2022-04-24 DIAGNOSIS — I5032 Chronic diastolic (congestive) heart failure: Secondary | ICD-10-CM | POA: Diagnosis not present
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
## 2022-05-27 IMAGING — CT CT PERC CHOLECYSTOSTOMY
1 of 3 series · 14 of 32 positions shown, 19 images · non-contrast
Comparison: none

INDICATION: Sepsis, acute cholecystitis and need for percutaneous
cholecystostomy tube. The patient is not currently a candidate for
cholecystectomy.

[Series 2: i-spiral 5.0 b30f · axial · 0.80mm/px · z∈[-226,-125]mm · 14 of 33 slices shown, 19 images]
[im 2/33  soft-tissue]
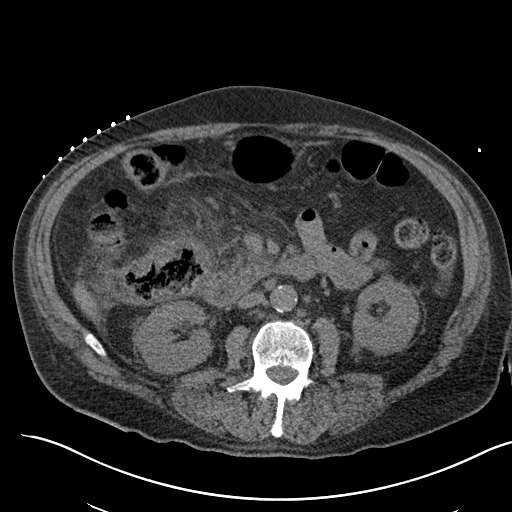
[im 2/33  bone]
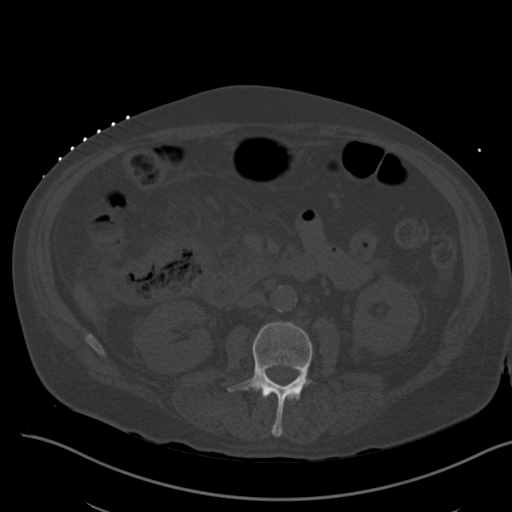
[im 6/33  soft-tissue]
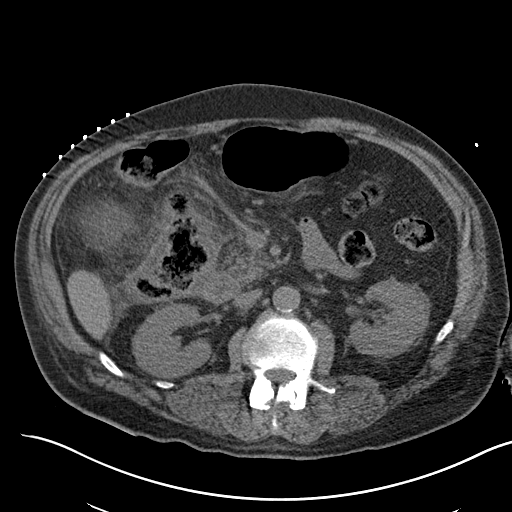
[im 7/33  soft-tissue]
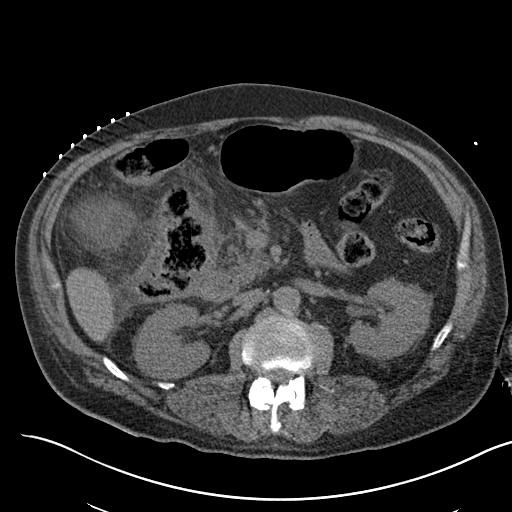
[im 9/33  soft-tissue]
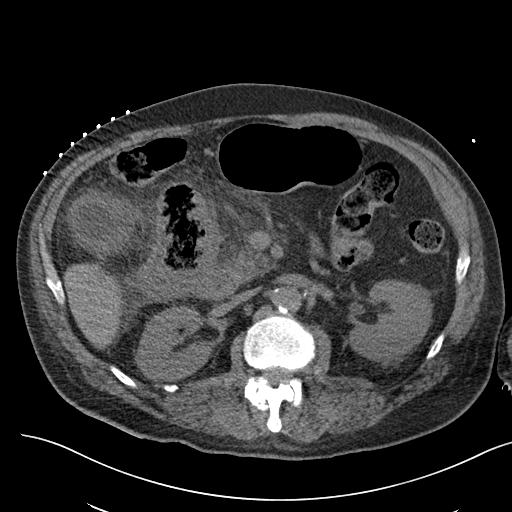
[im 12/33  soft-tissue]
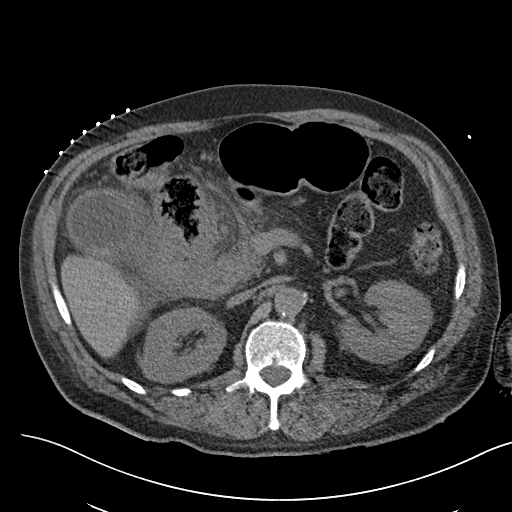
[im 14/33  soft-tissue]
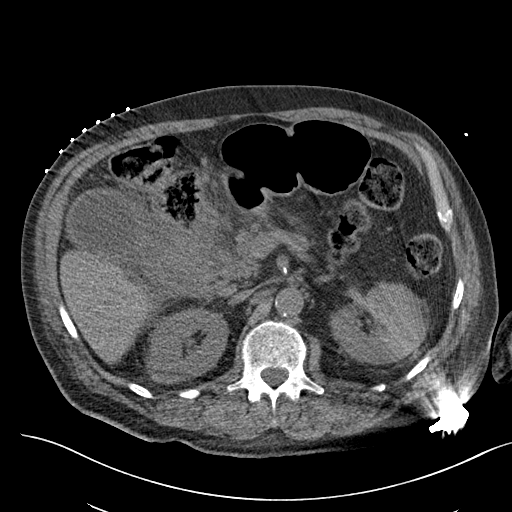
[im 17/33  soft-tissue]
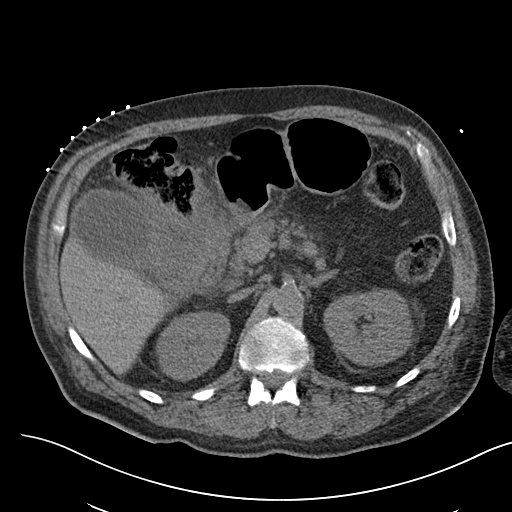
[im 19/33  soft-tissue]
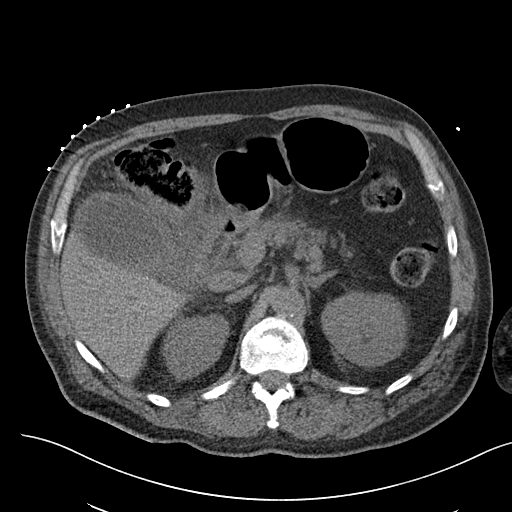
[im 21/33  soft-tissue]
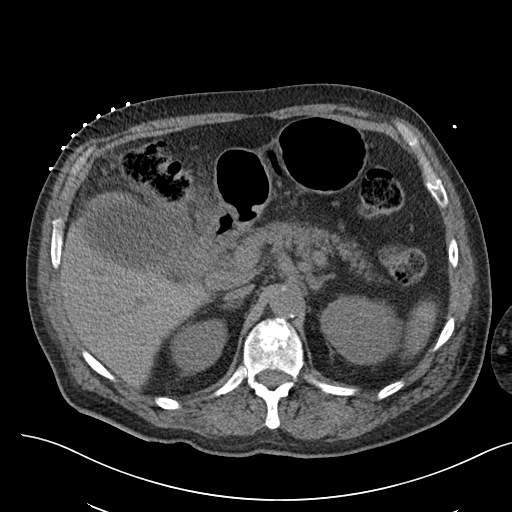
[im 21/33  bone]
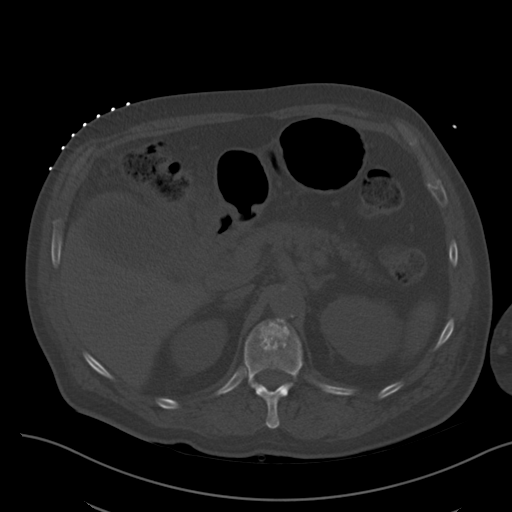
[im 24/33  soft-tissue]
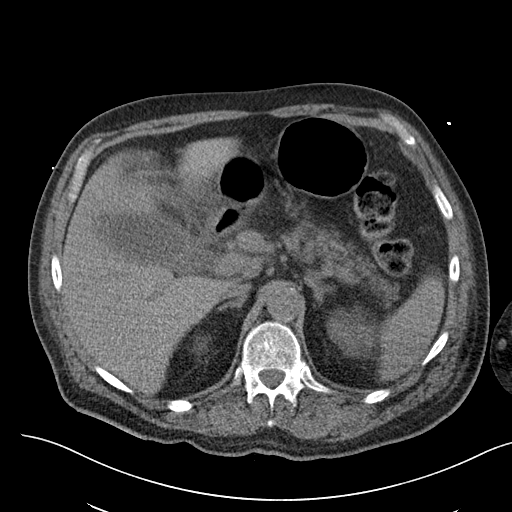
[im 26/33  soft-tissue]
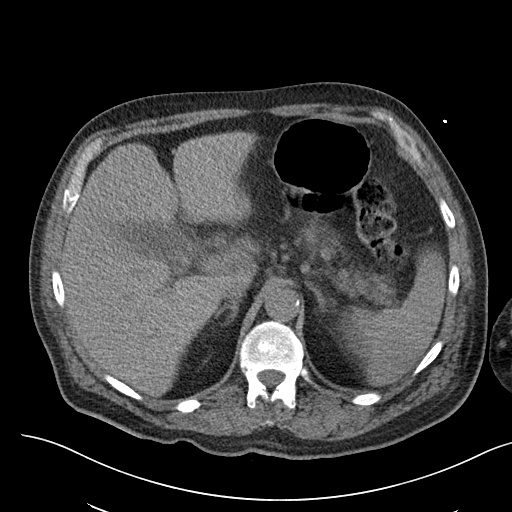
[im 26/33  lung]
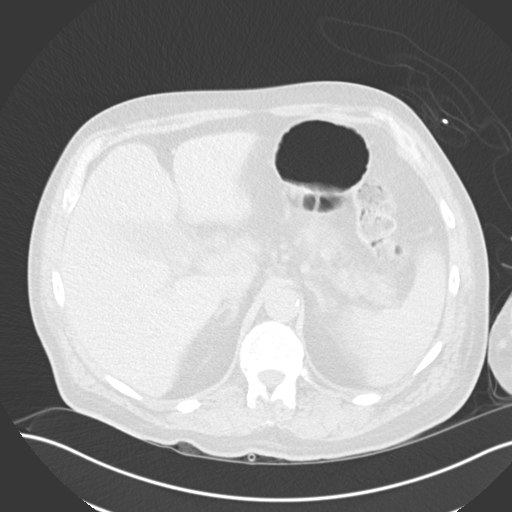
[im 27/33  soft-tissue]
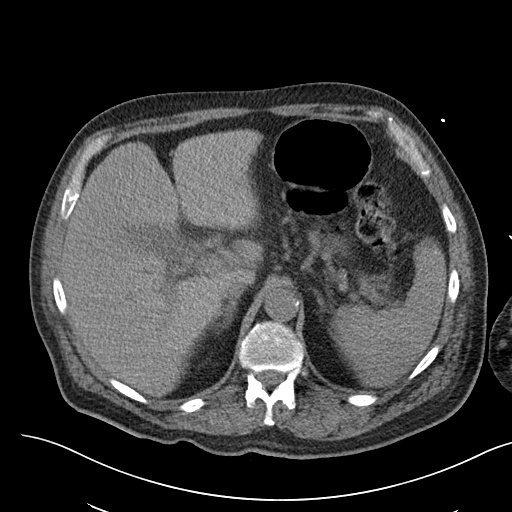
[im 27/33  lung]
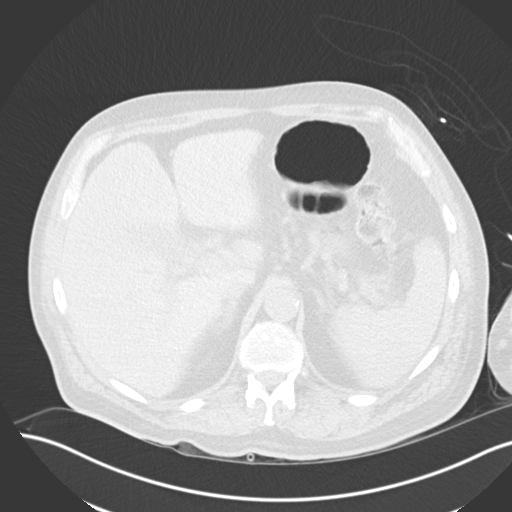
[im 29/33  lung]
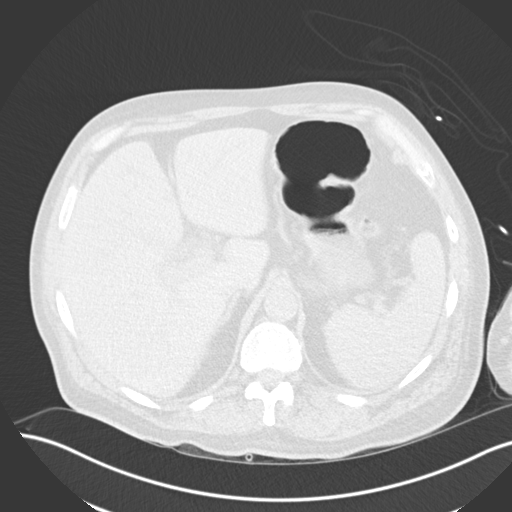
[im 31/33  soft-tissue]
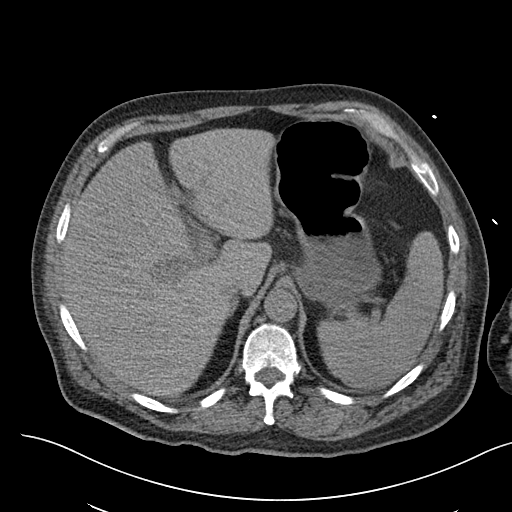
[im 31/33  lung]
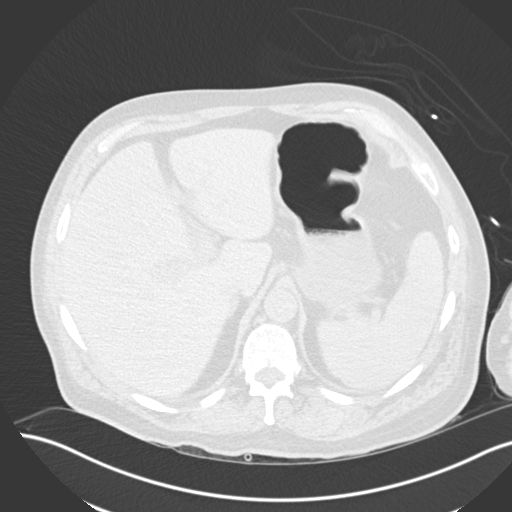

[14 of 32 positions shown; findings below may reference images not displayed]

EXAM:
CT PERCUTANEOUS CHOLECYSTOSTOMY

MEDICATIONS:
See below.

ANESTHESIA/SEDATION:
Moderate (conscious) sedation was employed during this procedure. A
total of Versed 2.0 mg and Fentanyl 1- mcg was administered
intravenously.

Moderate Sedation Time: 12 minutes. The patient's level of
consciousness and vital signs were monitored continuously by
radiology nursing throughout the procedure under my direct
supervision.

FLUOROSCOPY TIME:  CT guidance was utilized.

COMPLICATIONS:
None immediate.

PROCEDURE:
Informed written consent was obtained from the patient after a
thorough discussion of the procedural risks, benefits and
alternatives. All questions were addressed. Maximal Sterile Barrier
Technique was utilized including caps, mask, sterile gowns, sterile
gloves, sterile drape, hand hygiene and skin antiseptic. A timeout
was performed prior to the initiation of the procedure.

Under CT guidance, an 18 gauge trocar needle was advanced into the
gallbladder lumen. After return of bile, a guidewire was advanced,
the tract dilated and a 10 French percutaneous drainage catheter
placed. A sample of bile was sent for culture analysis. The drain
was attached to a gravity drainage bag and secured at the skin with
a Prolene retention suture and adhesive StatLock device.
FINDINGS: There was return of dark colored bile with some mild debris from the
gallbladder lumen.
IMPRESSION: Percutaneous cholecystostomy tube placement with 10 French drainage
catheter placed into the gallbladder lumen. The drain was attached
to gravity bag drainage. A bile sample was sent for culture
analysis.

## 2022-05-27 IMAGING — DX DG CHEST 1V
1 series · 1 of 1 positions shown · non-contrast
Comparison: Portable exam 8909 hours compared to 05/13/2020

CLINICAL DATA: Central line placement

EXAM:
CHEST  1 VIEW

[chest ap]
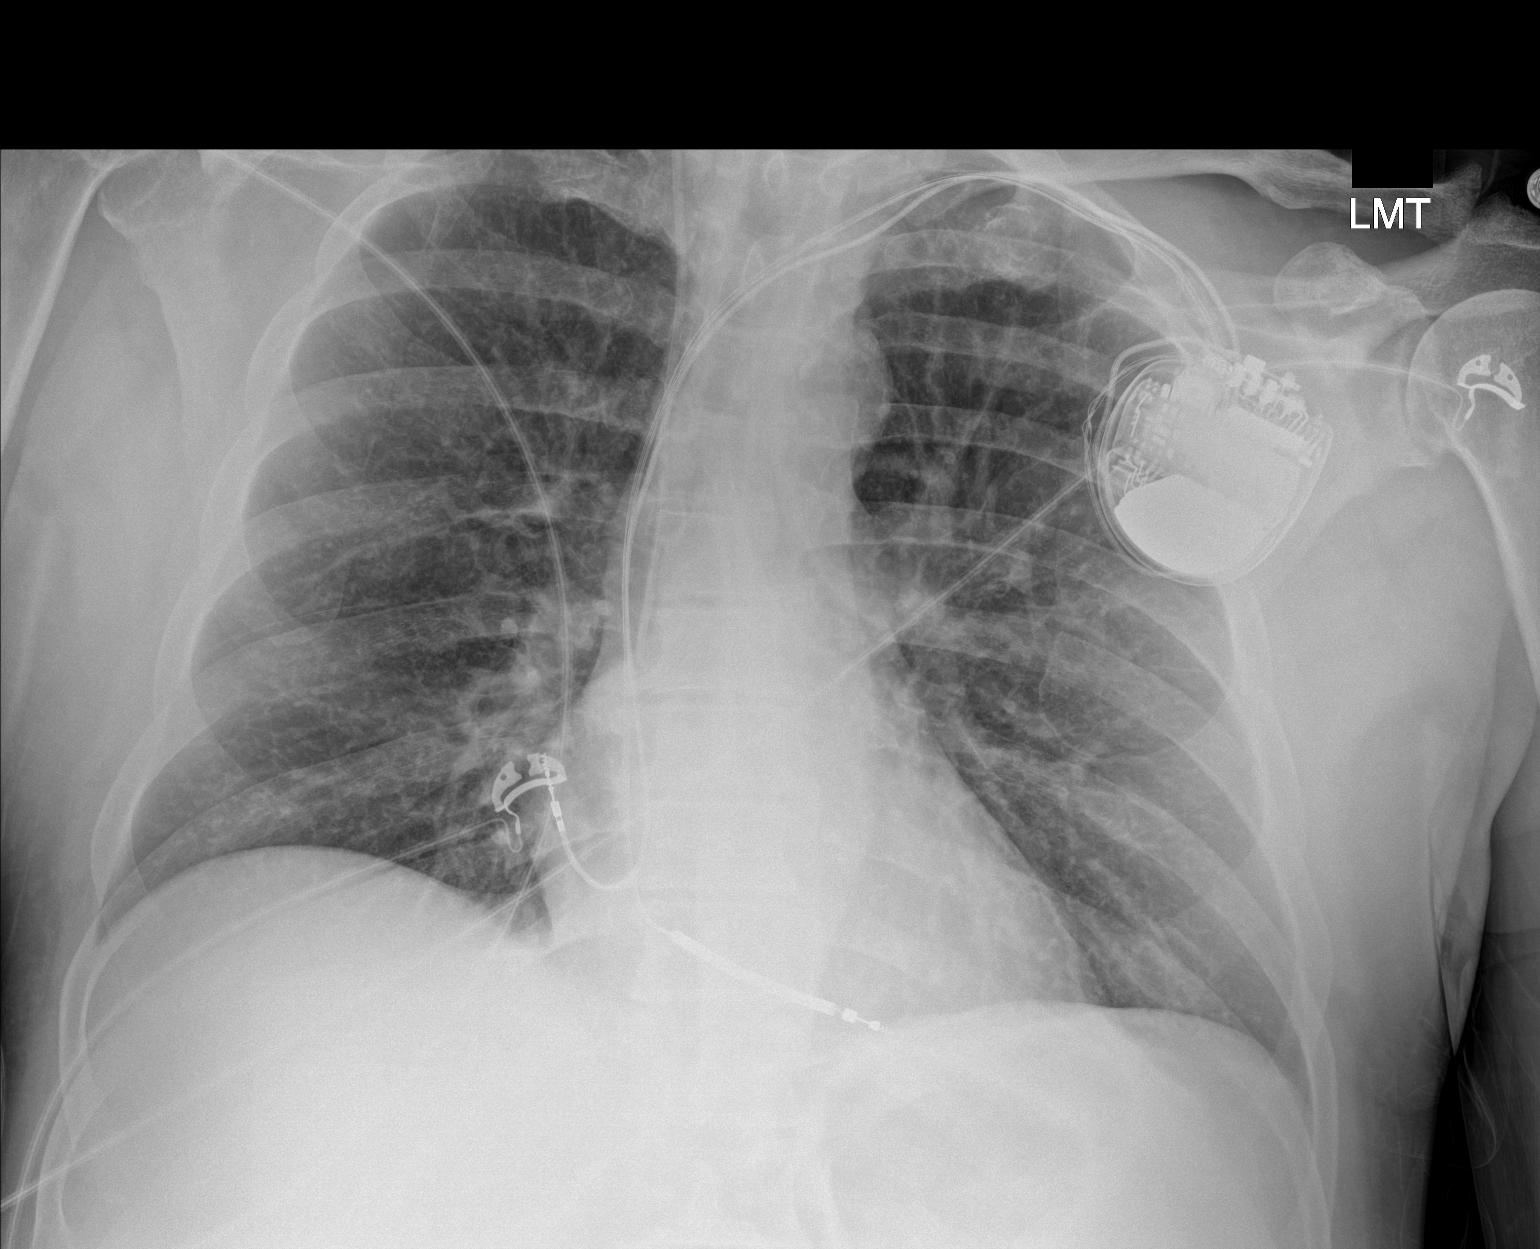

[1 of 1 positions shown; findings below may reference images not displayed]

FINDINGS: RIGHT jugular central venous catheter with tip projecting over SVC.

LEFT subclavian ICD leads project over RIGHT atrium and RIGHT
ventricle.

Normal heart size, mediastinal contours, and pulmonary vascularity.

Lungs clear.

No pulmonary infiltrate, pleural effusion, or pneumothorax.

Slightly decreased interstitial prominence since prior study.

No acute osseous findings.

Suspected chronic RIGHT rotator cuff tear.
IMPRESSION: No pneumothorax following RIGHT jugular line placement.

## 2022-05-27 IMAGING — DX DG CHEST 1V PORT
1 series · 1 of 1 positions shown · non-contrast
Comparison: 03/02/2019

CLINICAL DATA: Shortness of breath

EXAM:
PORTABLE CHEST 1 VIEW

[chest ap]
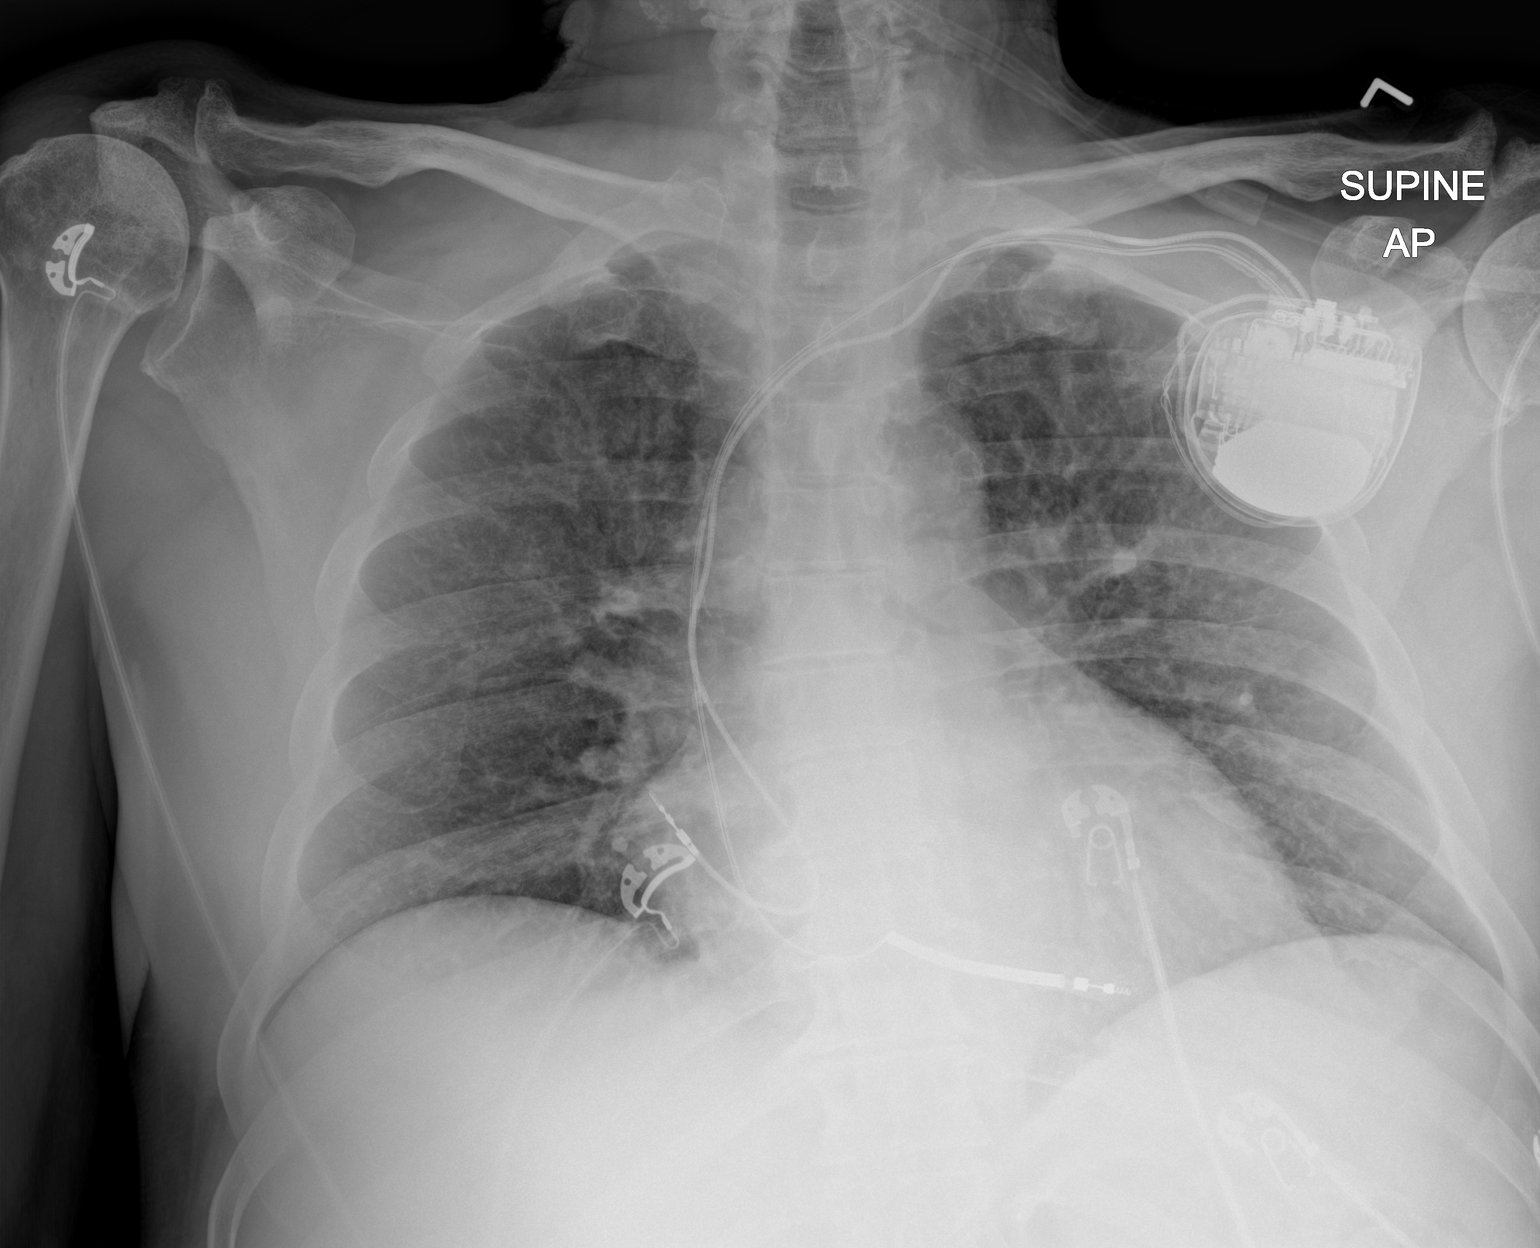

[1 of 1 positions shown; findings below may reference images not displayed]

FINDINGS: Left AICD in place with leads in the right atrium and right
ventricle. Heart is normal size. Peribronchial thickening and
interstitial prominence. No effusions or acute bony abnormality.
IMPRESSION: Peribronchial thickening and interstitial prominence, likely
bronchitic changes.

## 2022-05-27 IMAGING — CT CT ABD-PELV W/O CM
2 of 4 series · 16 of 46 positions shown, 18 images · non-contrast
Comparison: None.

CLINICAL DATA: Acute nonlocalized abdominal pain

EXAM:
CT ABDOMEN AND PELVIS WITHOUT CONTRAST
TECHNIQUE: Multidetector CT imaging of the abdomen and pelvis was performed
following the standard protocol without IV contrast.

[Series 2: routine abd/pel wo · axial · 0.86mm/px · z∈[-545,-70]mm · 13 of 105 slices shown, 15 images]
[im 5/105  soft-tissue]
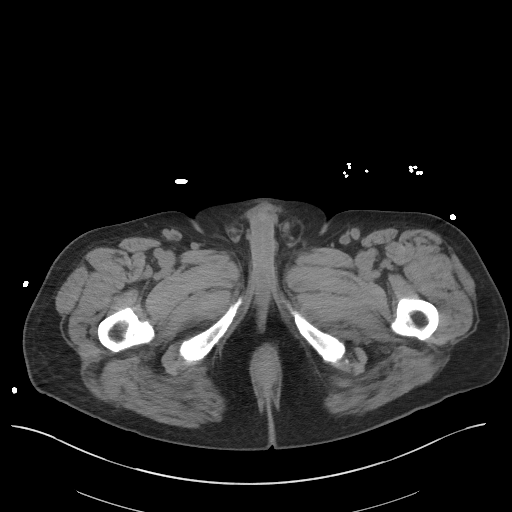
[im 5/105  bone]
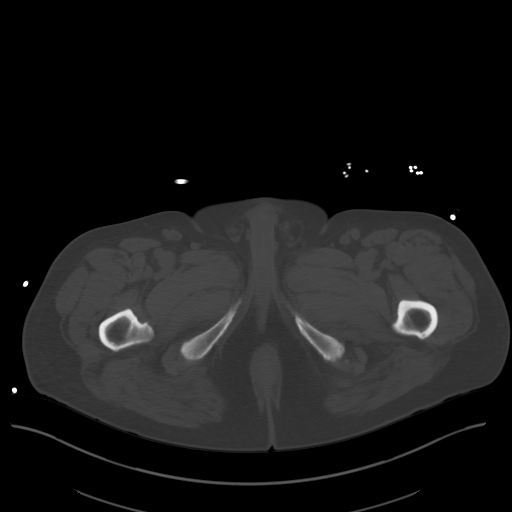
[im 15/105  soft-tissue]
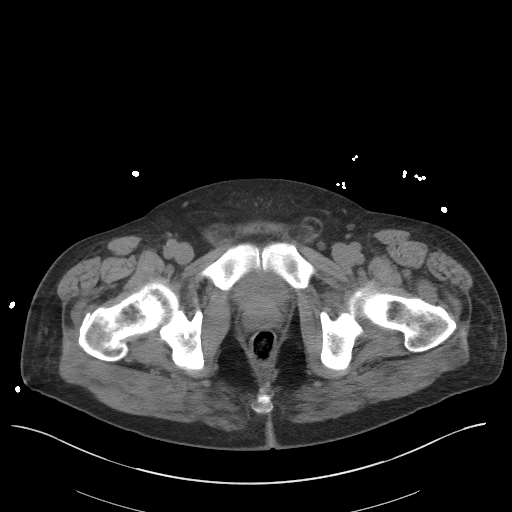
[im 20/105  soft-tissue]
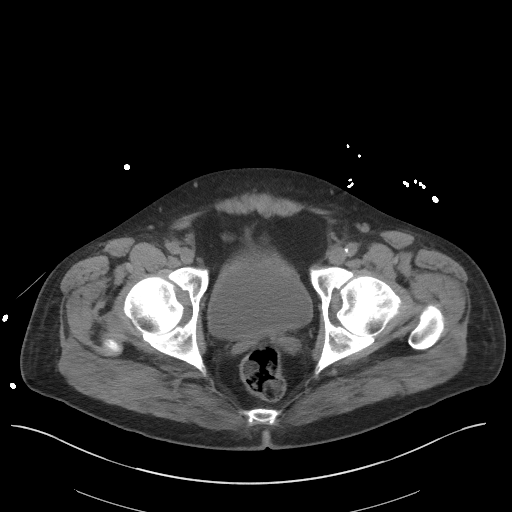
[im 30/105  soft-tissue]
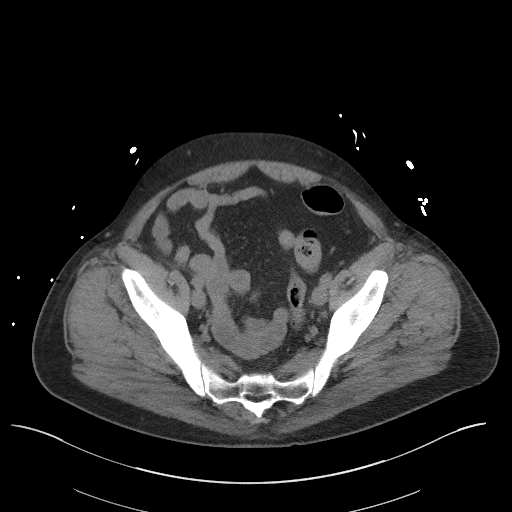
[im 35/105  soft-tissue]
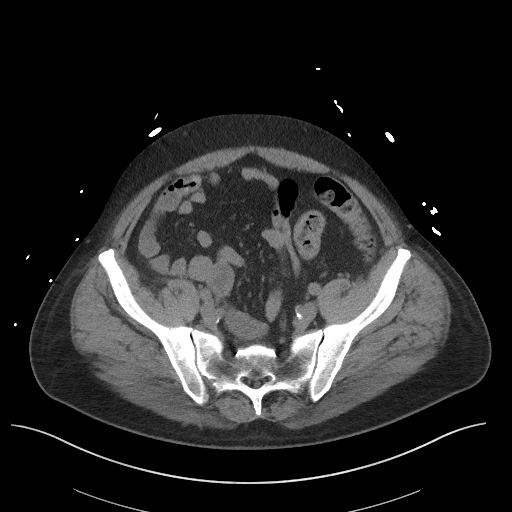
[im 45/105  soft-tissue]
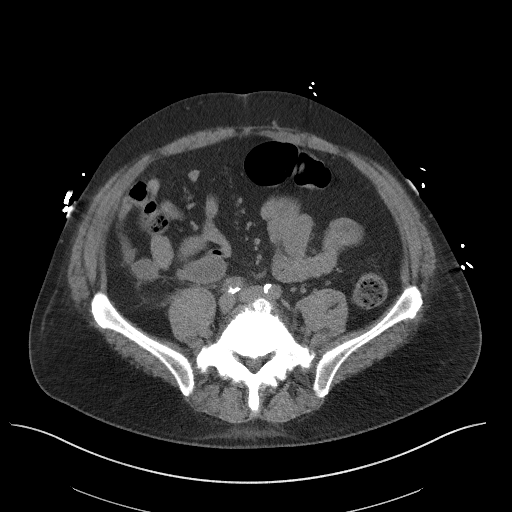
[im 55/105  soft-tissue]
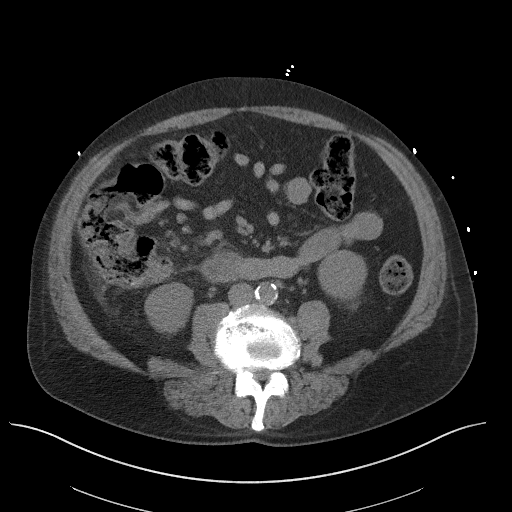
[im 60/105  soft-tissue]
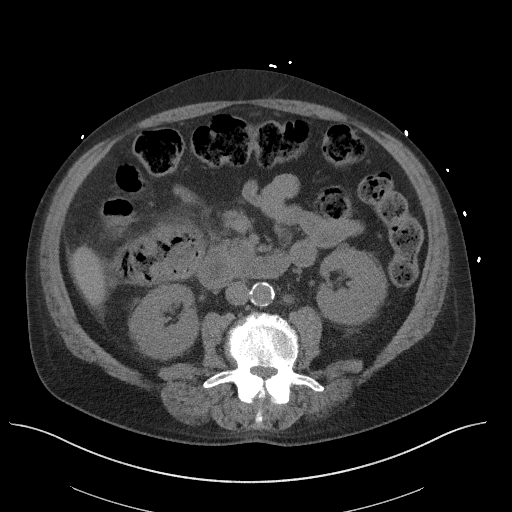
[im 70/105  soft-tissue]
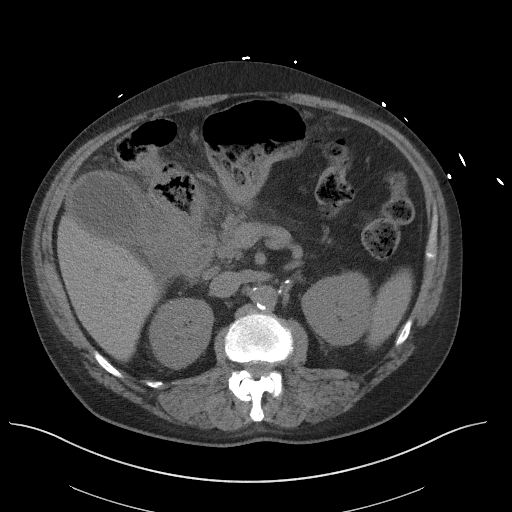
[im 70/105  bone]
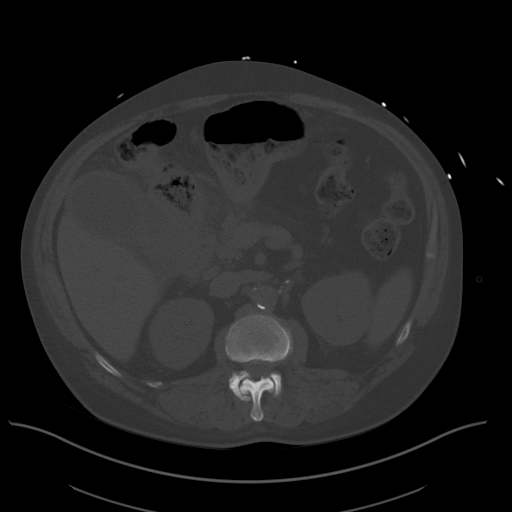
[im 75/105  soft-tissue]
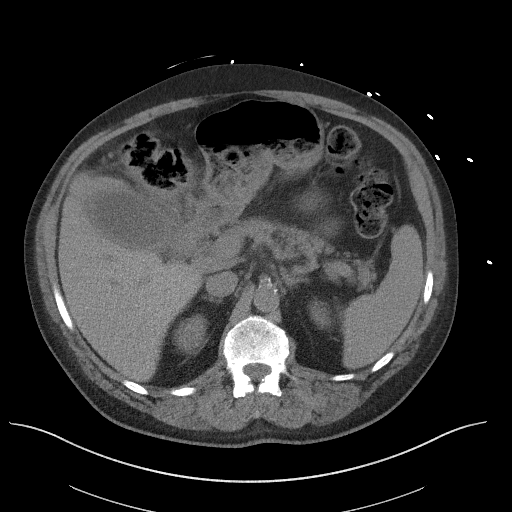
[im 85/105  soft-tissue]
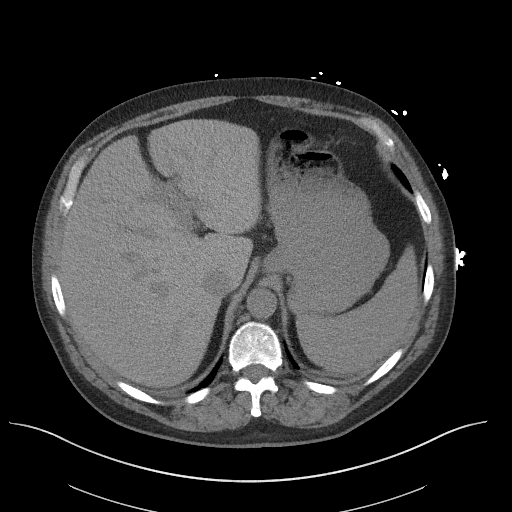
[im 90/105  soft-tissue]
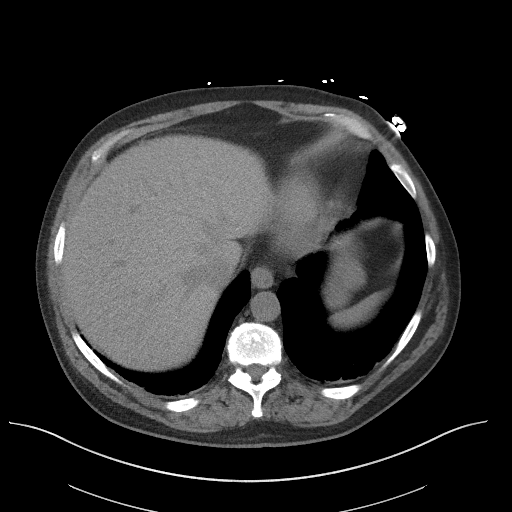
[im 100/105  soft-tissue]
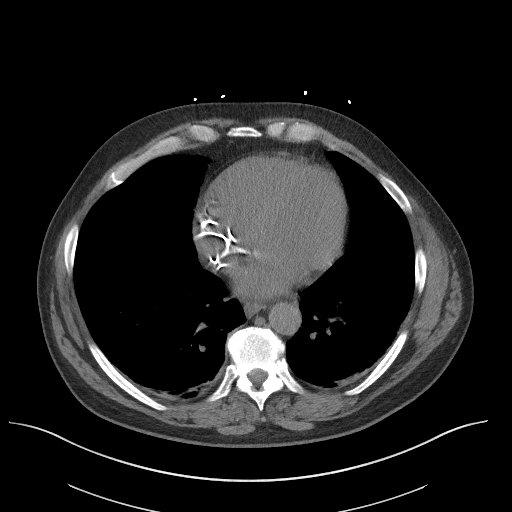

[Series 5: coronal st · coronal · 0.77mm/px · 3 of 115 slices shown]
[im 39/115  soft-tissue]
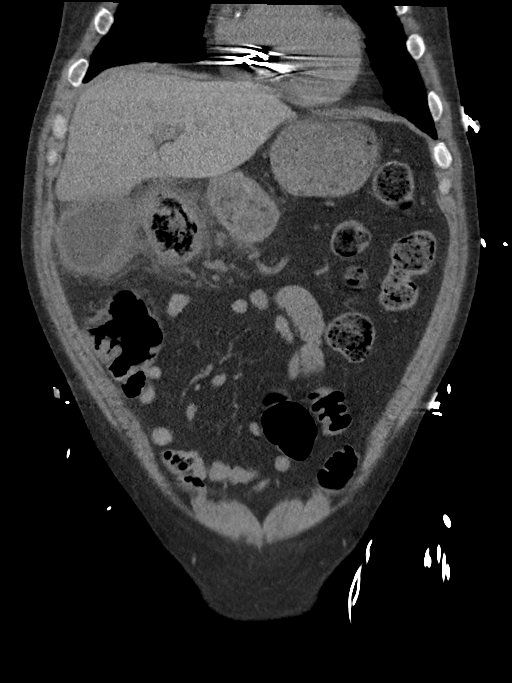
[im 51/115  soft-tissue]
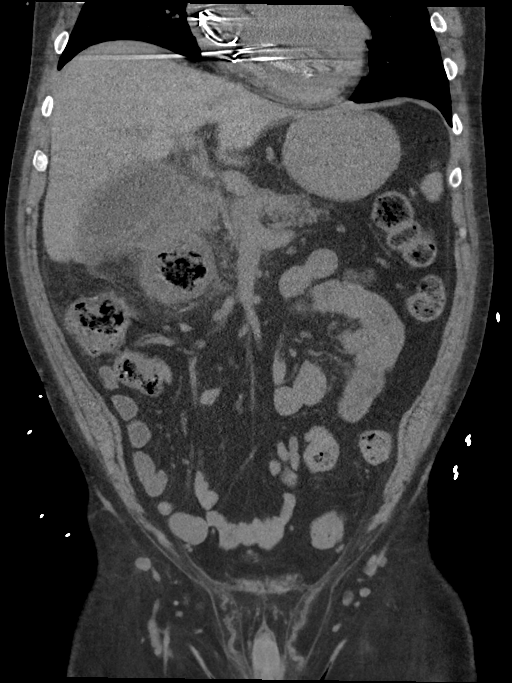
[im 64/115  soft-tissue]
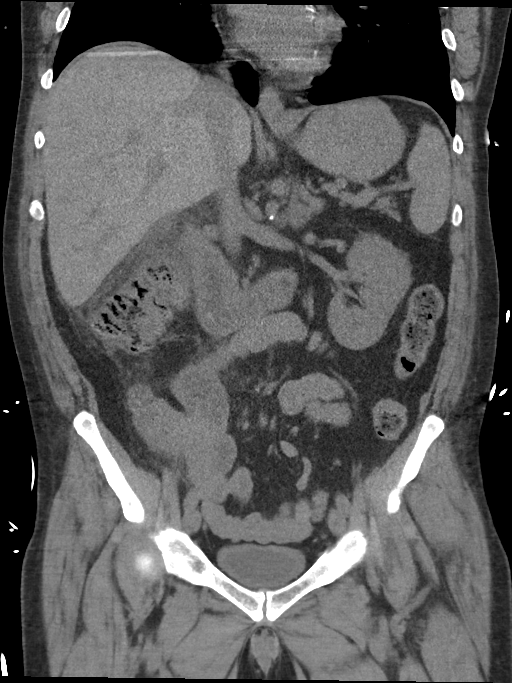

[16 of 46 positions shown; findings below may reference images not displayed]

FINDINGS: Lower chest:  Dependent atelectasis.  Partially covered pacer leads.

Hepatobiliary: No focal liver abnormality.

Thick walled gallbladder with small calculi towards the fundus.
Marked regional fat inflammation. Best seen on reformats there
appears to be a fluid density outpouching extending inferiorly from
the gallbladder measuring 14 mm.

Pancreas: Generalized atrophy

Spleen: Unremarkable.

Adrenals/Urinary Tract: Negative adrenals. No hydronephrosis or
stone. Unremarkable bladder.

Stomach/Bowel: Low-density wall thickening involving the duodenum
and hepatic flexure. The wall is most affected closest to the
gallbladder rather than along the medial aspect, presumably
secondary. No discrete colonic diverticulum is seen.

Vascular/Lymphatic: Atheromatous calcification which is generalized.
No mass or adenopathy.

Reproductive:No pathologic findings.

Other: No ascites or pneumoperitoneum.

Musculoskeletal: No acute abnormalities. Advanced disc and facet
degeneration of the lumbar spine with L2-3 anterolisthesis and
generalized straightening elsewhere.
IMPRESSION: 1. Marked right upper quadrant inflammation centered on the
gallbladder and consistent with acute calculus cholecystitis. Fluid
density outpouching extending inferiorly may reflect gangrenous
inflammation with breakdown of the wall. Recommend right upper
quadrant ultrasound.
2. Prominent inflammation at the hepatic flexure and mid duodenum
which is presumed secondary.

## 2022-06-02 ENCOUNTER — Other Ambulatory Visit: Payer: Self-pay | Admitting: Family

## 2022-08-28 ENCOUNTER — Other Ambulatory Visit: Payer: Self-pay | Admitting: Family

## 2022-09-25 ENCOUNTER — Other Ambulatory Visit: Payer: Self-pay | Admitting: Family

## 2022-10-31 ENCOUNTER — Encounter: Payer: Self-pay | Admitting: Family

## 2022-10-31 ENCOUNTER — Ambulatory Visit: Payer: Medicaid Other | Attending: Family | Admitting: Family

## 2022-10-31 ENCOUNTER — Encounter: Payer: Self-pay | Admitting: Pharmacy Technician

## 2022-10-31 VITALS — BP 74/50 | HR 80 | Wt 192.0 lb

## 2022-10-31 DIAGNOSIS — Z95810 Presence of automatic (implantable) cardiac defibrillator: Secondary | ICD-10-CM | POA: Diagnosis not present

## 2022-10-31 DIAGNOSIS — I472 Ventricular tachycardia, unspecified: Secondary | ICD-10-CM | POA: Diagnosis not present

## 2022-10-31 DIAGNOSIS — Z8679 Personal history of other diseases of the circulatory system: Secondary | ICD-10-CM | POA: Insufficient documentation

## 2022-10-31 DIAGNOSIS — I5042 Chronic combined systolic (congestive) and diastolic (congestive) heart failure: Secondary | ICD-10-CM | POA: Diagnosis not present

## 2022-10-31 DIAGNOSIS — E785 Hyperlipidemia, unspecified: Secondary | ICD-10-CM | POA: Insufficient documentation

## 2022-10-31 DIAGNOSIS — G8929 Other chronic pain: Secondary | ICD-10-CM | POA: Insufficient documentation

## 2022-10-31 DIAGNOSIS — I5032 Chronic diastolic (congestive) heart failure: Secondary | ICD-10-CM | POA: Diagnosis not present

## 2022-10-31 DIAGNOSIS — I130 Hypertensive heart and chronic kidney disease with heart failure and stage 1 through stage 4 chronic kidney disease, or unspecified chronic kidney disease: Secondary | ICD-10-CM | POA: Diagnosis not present

## 2022-10-31 DIAGNOSIS — B1920 Unspecified viral hepatitis C without hepatic coma: Secondary | ICD-10-CM | POA: Diagnosis not present

## 2022-10-31 DIAGNOSIS — K7460 Unspecified cirrhosis of liver: Secondary | ICD-10-CM | POA: Insufficient documentation

## 2022-10-31 DIAGNOSIS — J449 Chronic obstructive pulmonary disease, unspecified: Secondary | ICD-10-CM | POA: Diagnosis present

## 2022-10-31 DIAGNOSIS — Z79899 Other long term (current) drug therapy: Secondary | ICD-10-CM | POA: Diagnosis not present

## 2022-10-31 DIAGNOSIS — I10 Essential (primary) hypertension: Secondary | ICD-10-CM | POA: Diagnosis not present

## 2022-10-31 DIAGNOSIS — B1910 Unspecified viral hepatitis B without hepatic coma: Secondary | ICD-10-CM | POA: Insufficient documentation

## 2022-10-31 DIAGNOSIS — I5022 Chronic systolic (congestive) heart failure: Secondary | ICD-10-CM

## 2022-10-31 DIAGNOSIS — I2510 Atherosclerotic heart disease of native coronary artery without angina pectoris: Secondary | ICD-10-CM | POA: Diagnosis not present

## 2022-10-31 DIAGNOSIS — B182 Chronic viral hepatitis C: Secondary | ICD-10-CM

## 2022-10-31 DIAGNOSIS — Z87891 Personal history of nicotine dependence: Secondary | ICD-10-CM | POA: Insufficient documentation

## 2022-10-31 DIAGNOSIS — F32A Depression, unspecified: Secondary | ICD-10-CM | POA: Insufficient documentation

## 2022-10-31 NOTE — Patient Instructions (Signed)
Just for tonight only, do NOT take your entresto   Check blood pressure every 1-2 days and record. If the top number stays below 100, let me know
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
## 2022-11-01 LAB — LIPID PANEL
Chol/HDL Ratio: 3.5 ratio (ref 0.0–5.0)
Cholesterol, Total: 90 mg/dL — ABNORMAL LOW (ref 100–199)
HDL: 26 mg/dL — ABNORMAL LOW (ref >39)
LDL Chol Calc (NIH): 34 mg/dL (ref 0–99)
Triglycerides: 186 mg/dL — ABNORMAL HIGH (ref 0–149)
VLDL Cholesterol Cal: 30 mg/dL (ref 5–40)

## 2022-11-01 LAB — BASIC METABOLIC PANEL
BUN/Creatinine Ratio: 26 — ABNORMAL HIGH (ref 10–24)
BUN: 23 mg/dL (ref 8–27)
CO2: 22 mmol/L (ref 20–29)
Calcium: 9.5 mg/dL (ref 8.6–10.2)
Chloride: 103 mmol/L (ref 96–106)
Creatinine, Ser: 0.9 mg/dL (ref 0.76–1.27)
Glucose: 88 mg/dL (ref 70–99)
Potassium: 4.8 mmol/L (ref 3.5–5.2)
Sodium: 142 mmol/L (ref 134–144)
eGFR: 98 mL/min/{1.73_m2} (ref >59)

## 2022-11-21 ENCOUNTER — Telehealth: Payer: Self-pay | Admitting: Family

## 2022-11-21 NOTE — Telephone Encounter (Signed)
Returned patient's call as he wanted to discuss his blood pressure readings. He had a pain management appointment yesterday and his BP in the office was 70/62. His BP's at home had been stable running 90's/ 50-60's. Denies dizziness, blurry vision, headaches or worsening fatigue.   Advised him to decrease his entresto in half (currently taking 97/103mg ) to 1/2 tablet BID. Continue checking blood pressure at home and should it drop lower to call and schedule a sooner follow-up appointment. He verbalized understanding.

## 2023-01-16 ENCOUNTER — Other Ambulatory Visit: Payer: Self-pay | Admitting: Family
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
## 2023-02-04 ENCOUNTER — Ambulatory Visit: Payer: Medicaid Other | Attending: Family | Admitting: Family

## 2023-02-04 ENCOUNTER — Encounter: Payer: Self-pay | Admitting: Family

## 2023-02-04 VITALS — BP 96/73 | HR 88 | Ht 72.0 in | Wt 198.0 lb

## 2023-02-04 DIAGNOSIS — I130 Hypertensive heart and chronic kidney disease with heart failure and stage 1 through stage 4 chronic kidney disease, or unspecified chronic kidney disease: Secondary | ICD-10-CM | POA: Diagnosis present

## 2023-02-04 DIAGNOSIS — K7460 Unspecified cirrhosis of liver: Secondary | ICD-10-CM | POA: Diagnosis not present

## 2023-02-04 DIAGNOSIS — B1920 Unspecified viral hepatitis C without hepatic coma: Secondary | ICD-10-CM | POA: Diagnosis not present

## 2023-02-04 DIAGNOSIS — I2510 Atherosclerotic heart disease of native coronary artery without angina pectoris: Secondary | ICD-10-CM | POA: Insufficient documentation

## 2023-02-04 DIAGNOSIS — Z8679 Personal history of other diseases of the circulatory system: Secondary | ICD-10-CM | POA: Insufficient documentation

## 2023-02-04 DIAGNOSIS — I5042 Chronic combined systolic (congestive) and diastolic (congestive) heart failure: Secondary | ICD-10-CM | POA: Insufficient documentation

## 2023-02-04 DIAGNOSIS — Z95810 Presence of automatic (implantable) cardiac defibrillator: Secondary | ICD-10-CM | POA: Diagnosis not present

## 2023-02-04 DIAGNOSIS — I5032 Chronic diastolic (congestive) heart failure: Secondary | ICD-10-CM

## 2023-02-04 DIAGNOSIS — F32A Depression, unspecified: Secondary | ICD-10-CM | POA: Diagnosis not present

## 2023-02-04 DIAGNOSIS — I472 Ventricular tachycardia, unspecified: Secondary | ICD-10-CM | POA: Diagnosis not present

## 2023-02-04 DIAGNOSIS — N189 Chronic kidney disease, unspecified: Secondary | ICD-10-CM | POA: Diagnosis not present

## 2023-02-04 DIAGNOSIS — Z79899 Other long term (current) drug therapy: Secondary | ICD-10-CM | POA: Insufficient documentation

## 2023-02-04 DIAGNOSIS — G8929 Other chronic pain: Secondary | ICD-10-CM | POA: Insufficient documentation

## 2023-02-04 DIAGNOSIS — Z87891 Personal history of nicotine dependence: Secondary | ICD-10-CM | POA: Diagnosis not present

## 2023-02-04 DIAGNOSIS — E785 Hyperlipidemia, unspecified: Secondary | ICD-10-CM | POA: Insufficient documentation

## 2023-02-04 DIAGNOSIS — I10 Essential (primary) hypertension: Secondary | ICD-10-CM

## 2023-02-04 DIAGNOSIS — J449 Chronic obstructive pulmonary disease, unspecified: Secondary | ICD-10-CM | POA: Insufficient documentation

## 2023-02-04 DIAGNOSIS — B1910 Unspecified viral hepatitis B without hepatic coma: Secondary | ICD-10-CM | POA: Insufficient documentation

## 2023-02-04 DIAGNOSIS — B182 Chronic viral hepatitis C: Secondary | ICD-10-CM

## 2023-02-04 NOTE — Patient Instructions (Addendum)
It was great to see you today.

## 2023-03-13 ENCOUNTER — Telehealth: Payer: Self-pay

## 2023-03-13 NOTE — Telephone Encounter (Signed)
 Patient called to report that his insurance is now not covering his jardiance ; can you look into this for him?

## 2023-03-14 ENCOUNTER — Telehealth: Payer: Self-pay

## 2023-03-14 ENCOUNTER — Telehealth: Payer: Self-pay | Admitting: Pharmacist

## 2023-03-14 ENCOUNTER — Other Ambulatory Visit (HOSPITAL_COMMUNITY): Payer: Self-pay

## 2023-03-14 NOTE — Telephone Encounter (Signed)
 Attempted to call patient to inform him that a prior authorization has been completed and approved for Jardiance  and his copay is $4. He may pick up the prescription at any time.

## 2023-03-14 NOTE — Telephone Encounter (Signed)
 Advanced Heart Failure Patient Advocate Encounter  Prior authorization for Jardiance  has been submitted and approved. Test billing returns $4 for 90 day supply.  Key: [id number] Effective: 02/28/2023 to 03/13/2024  Rachel DEL, CPhT Rx Patient Advocate Phone: [phone]

## 2023-08-02 ENCOUNTER — Telehealth: Payer: Self-pay | Admitting: Family

## 2023-08-02 NOTE — Telephone Encounter (Signed)
 Called to confirm/remind patient of their appointment at the [facility] on 08/05/23.   Appointment:   [] Confirmed  [] Left mess   [] No answer/No voice mail  [] VM Full/unable to leave message  [x] Phone not in service  Patient reminded to bring all medications and/or complete list.  Confirmed patient has transportation. Gave directions, instructed to utilize valet parking.
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
## 2023-08-05 ENCOUNTER — Telehealth: Payer: Self-pay | Admitting: Family

## 2023-08-05 ENCOUNTER — Encounter: Payer: Medicaid Other | Admitting: Family

## 2023-08-05 NOTE — Telephone Encounter (Signed)
 Patient did not show for his Heart Failure Clinic appointment on 08/05/23.

## 2023-09-09 ENCOUNTER — Other Ambulatory Visit: Payer: Self-pay | Admitting: Family

## 2023-09-09 NOTE — Telephone Encounter (Signed)
 Pt's last appointment was a no-show. Attempted to reach patient to reschedule missed appt. To address medication needs. Someone answered the phone number on file and said that it was a wrong number.  Attempted to call patient's spouse's number on file and call would not go through.  Will route to provider for refill advice.

## 2023-09-27 NOTE — Progress Notes (Signed)
 [employer].  See chart review for scanned document of complete results.

## 2023-10-08 ENCOUNTER — Other Ambulatory Visit: Payer: Self-pay | Admitting: Family

## 2023-11-05 ENCOUNTER — Other Ambulatory Visit: Payer: Self-pay | Admitting: Family

## 2023-11-21 ENCOUNTER — Other Ambulatory Visit: Payer: Self-pay | Admitting: Family

## 2023-12-19 ENCOUNTER — Other Ambulatory Visit: Payer: Self-pay | Admitting: Physician Assistant

## 2023-12-19 DIAGNOSIS — Z95810 Presence of automatic (implantable) cardiac defibrillator: Secondary | ICD-10-CM

## 2023-12-19 DIAGNOSIS — I25119 Atherosclerotic heart disease of native coronary artery with unspecified angina pectoris: Secondary | ICD-10-CM

## 2023-12-19 DIAGNOSIS — I255 Ischemic cardiomyopathy: Secondary | ICD-10-CM

## 2023-12-20 ENCOUNTER — Telehealth (HOSPITAL_COMMUNITY): Payer: Self-pay | Admitting: *Deleted

## 2023-12-20 NOTE — Telephone Encounter (Signed)
Attempted to call patient regarding upcoming cardiac CT appointment. Unable to leave a message.  Jelani Trueba RN Navigator Cardiac Imaging [facility] [phone] Office [phone] Cell  

## 2023-12-21 ENCOUNTER — Other Ambulatory Visit: Payer: Self-pay | Admitting: Family

## 2023-12-23 ENCOUNTER — Ambulatory Visit
Admission: RE | Admit: 2023-12-23 | Discharge: 2023-12-23 | Disposition: A | Discharge status: Home / Self Care | Source: Ambulatory Visit | Attending: Physician Assistant | Admitting: Physician Assistant

## 2023-12-23 DIAGNOSIS — I25119 Atherosclerotic heart disease of native coronary artery with unspecified angina pectoris: Secondary | ICD-10-CM | POA: Insufficient documentation

## 2023-12-23 DIAGNOSIS — I255 Ischemic cardiomyopathy: Secondary | ICD-10-CM | POA: Diagnosis present

## 2023-12-23 DIAGNOSIS — Z95810 Presence of automatic (implantable) cardiac defibrillator: Secondary | ICD-10-CM | POA: Diagnosis present

## 2023-12-23 MED ORDER — NITROGLYCERIN 0.4 MG SL SUBL
0.8000 mg | SUBLINGUAL_TABLET | Freq: Once | SUBLINGUAL | Status: AC
  Administered 2023-12-23: 0.8 mg via SUBLINGUAL
  Filled 2023-12-23: qty 25

## 2023-12-23 MED ORDER — IOHEXOL 350 MG/ML SOLN
100.0000 mL | Freq: Once | INTRAVENOUS | Status: AC | PRN
  Administered 2023-12-23: 100 mL via INTRAVENOUS

## 2023-12-23 MED ORDER — METOPROLOL TARTRATE 5 MG/5ML IV SOLN
10.0000 mg | Freq: Once | INTRAVENOUS | Status: DC | PRN
  Filled 2023-12-23: qty 10

## 2023-12-23 MED ORDER — DILTIAZEM HCL 25 MG/5ML IV SOLN
10.0000 mg | INTRAVENOUS | Status: DC | PRN
  Filled 2023-12-23: qty 5

## 2023-12-23 NOTE — Progress Notes (Signed)
 Patient tolerated CT well. Vital signs stable encourage to drink water throughout day.Reasons explained and verbalized understanding. Ambulated steady gait.

## 2024-01-09 ENCOUNTER — Other Ambulatory Visit: Payer: Self-pay | Admitting: Family
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
## 2024-01-15 ENCOUNTER — Telehealth: Payer: Self-pay | Admitting: Family

## 2024-01-15 ENCOUNTER — Ambulatory Visit: Admitting: Family

## 2024-01-15 NOTE — Telephone Encounter (Signed)
 Patient did not show for his Heart Failure Clinic appointment on 01/15/24.

## 2024-02-26 ENCOUNTER — Other Ambulatory Visit: Payer: Self-pay

## 2024-02-26 ENCOUNTER — Encounter: Payer: Self-pay | Admitting: Cardiology

## 2024-02-26 ENCOUNTER — Ambulatory Visit: Admission: RE | Admit: 2024-02-26 | Discharge: 2024-02-26 | Disposition: A | Discharge status: Home / Self Care

## 2024-02-26 ENCOUNTER — Encounter: Admission: RE | Payer: Self-pay | Source: Home / Self Care

## 2024-02-26 DIAGNOSIS — I5022 Chronic systolic (congestive) heart failure: Secondary | ICD-10-CM | POA: Diagnosis not present

## 2024-02-26 DIAGNOSIS — Z7982 Long term (current) use of aspirin: Secondary | ICD-10-CM | POA: Insufficient documentation

## 2024-02-26 DIAGNOSIS — F17210 Nicotine dependence, cigarettes, uncomplicated: Secondary | ICD-10-CM | POA: Insufficient documentation

## 2024-02-26 DIAGNOSIS — Z95810 Presence of automatic (implantable) cardiac defibrillator: Secondary | ICD-10-CM | POA: Diagnosis not present

## 2024-02-26 DIAGNOSIS — I472 Ventricular tachycardia, unspecified: Secondary | ICD-10-CM | POA: Insufficient documentation

## 2024-02-26 DIAGNOSIS — E785 Hyperlipidemia, unspecified: Secondary | ICD-10-CM | POA: Insufficient documentation

## 2024-02-26 DIAGNOSIS — I110 Hypertensive heart disease with heart failure: Secondary | ICD-10-CM | POA: Diagnosis not present

## 2024-02-26 DIAGNOSIS — I255 Ischemic cardiomyopathy: Secondary | ICD-10-CM | POA: Diagnosis not present

## 2024-02-26 DIAGNOSIS — R9430 Abnormal result of cardiovascular function study, unspecified: Secondary | ICD-10-CM | POA: Diagnosis present

## 2024-02-26 DIAGNOSIS — Z79899 Other long term (current) drug therapy: Secondary | ICD-10-CM | POA: Diagnosis not present

## 2024-02-26 DIAGNOSIS — I2582 Chronic total occlusion of coronary artery: Secondary | ICD-10-CM | POA: Insufficient documentation

## 2024-02-26 DIAGNOSIS — I2510 Atherosclerotic heart disease of native coronary artery without angina pectoris: Secondary | ICD-10-CM | POA: Insufficient documentation

## 2024-02-26 HISTORY — PX: LEFT HEART CATH AND CORONARY ANGIOGRAPHY: CATH118249

## 2024-02-26 MED ORDER — FREE WATER: 250.0000 mL | Freq: Once | Status: DC

## 2024-02-26 MED ORDER — LIDOCAINE HCL 1 % IJ SOLN
INTRAMUSCULAR | Status: AC
  Filled 2024-02-26: qty 20

## 2024-02-26 MED ORDER — VERAPAMIL HCL 2.5 MG/ML IV SOLN
INTRAVENOUS | Status: DC | PRN
  Administered 2024-02-26: 2.5 mg via INTRAVENOUS

## 2024-02-26 MED ORDER — SODIUM CHLORIDE 0.9 % IV SOLN
250.0000 mL | INTRAVENOUS | Status: DC | PRN
  Administered 2024-02-26: 250 mL via INTRAVENOUS

## 2024-02-26 MED ORDER — HYDRALAZINE HCL 20 MG/ML IJ SOLN: 10.0000 mg | INTRAMUSCULAR | Status: DC | PRN

## 2024-02-26 MED ORDER — IOHEXOL 300 MG/ML  SOLN
INTRAMUSCULAR | Status: DC | PRN
  Administered 2024-02-26: 40 mL

## 2024-02-26 MED ORDER — HEPARIN (PORCINE) IN NACL 1000-0.9 UT/500ML-% IV SOLN
INTRAVENOUS | Status: AC
  Filled 2024-02-26: qty 1000

## 2024-02-26 MED ORDER — HEPARIN (PORCINE) IN NACL 2000-0.9 UNIT/L-% IV SOLN
INTRAVENOUS | Status: DC | PRN
  Administered 2024-02-26: 1000 mL

## 2024-02-26 MED ORDER — LIDOCAINE HCL (PF) 1 % IJ SOLN
INTRAMUSCULAR | Status: DC | PRN
  Administered 2024-02-26: 5 mL

## 2024-02-26 MED ORDER — SODIUM CHLORIDE 0.9% FLUSH: 3.0000 mL | Freq: Two times a day (BID) | INTRAVENOUS | Status: DC

## 2024-02-26 MED ORDER — ONDANSETRON HCL 4 MG/2ML IJ SOLN: 4.0000 mg | Freq: Four times a day (QID) | INTRAMUSCULAR | Status: DC | PRN

## 2024-02-26 MED ORDER — HEPARIN SODIUM (PORCINE) 1000 UNIT/ML IJ SOLN
INTRAMUSCULAR | Status: DC | PRN
  Administered 2024-02-26: 5000 [IU] via INTRAVENOUS

## 2024-02-26 MED ORDER — SODIUM CHLORIDE 0.9 % IV SOLN: 250.0000 mL | INTRAVENOUS | Status: DC | PRN

## 2024-02-26 MED ORDER — MIDAZOLAM HCL (PF) 2 MG/2ML IJ SOLN
INTRAMUSCULAR | Status: DC | PRN
  Administered 2024-02-26: 1 mg via INTRAVENOUS

## 2024-02-26 MED ORDER — HEPARIN SODIUM (PORCINE) 1000 UNIT/ML IJ SOLN
INTRAMUSCULAR | Status: AC
  Filled 2024-02-26: qty 10

## 2024-02-26 MED ORDER — ACETAMINOPHEN 325 MG PO TABS: 650.0000 mg | ORAL_TABLET | ORAL | Status: DC | PRN

## 2024-02-26 MED ORDER — FENTANYL CITRATE (PF) 100 MCG/2ML IJ SOLN
INTRAMUSCULAR | Status: AC
  Filled 2024-02-26: qty 2

## 2024-02-26 MED ORDER — FENTANYL CITRATE (PF) 100 MCG/2ML IJ SOLN
INTRAMUSCULAR | Status: DC | PRN
  Administered 2024-02-26: 50 ug via INTRAVENOUS

## 2024-02-26 MED ORDER — ASPIRIN 81 MG PO CHEW
CHEWABLE_TABLET | ORAL | Status: AC
  Administered 2024-02-26: 81 mg via ORAL
  Filled 2024-02-26: qty 1

## 2024-02-26 MED ORDER — ASPIRIN 81 MG PO CHEW: 81.0000 mg | CHEWABLE_TABLET | ORAL | Status: AC

## 2024-02-26 MED ORDER — LABETALOL HCL 5 MG/ML IV SOLN: 10.0000 mg | INTRAVENOUS | Status: DC | PRN

## 2024-02-26 MED ORDER — SODIUM CHLORIDE 0.9% FLUSH: 3.0000 mL | INTRAVENOUS | Status: DC | PRN

## 2024-02-26 MED ORDER — VERAPAMIL HCL 2.5 MG/ML IV SOLN
INTRAVENOUS | Status: AC
  Filled 2024-02-26: qty 2

## 2024-02-26 MED ORDER — MIDAZOLAM HCL 2 MG/2ML IJ SOLN
INTRAMUSCULAR | Status: AC
  Filled 2024-02-26: qty 2

## 2024-02-26 MED ORDER — FREE WATER: 500.0000 mL | Freq: Once | Status: DC
